# Patient Record
Sex: Male | Born: 1935
Health system: Southern US, Community
[De-identification: ages and names within clinical notes are randomized; demographics above are authoritative.]

## PROBLEM LIST (undated history)

## (undated) DIAGNOSIS — M8580 Other specified disorders of bone density and structure, unspecified site: Secondary | ICD-10-CM

## (undated) DIAGNOSIS — K635 Polyp of colon: Secondary | ICD-10-CM

## (undated) DIAGNOSIS — E785 Hyperlipidemia, unspecified: Secondary | ICD-10-CM

## (undated) DIAGNOSIS — I4891 Unspecified atrial fibrillation: Secondary | ICD-10-CM

## (undated) DIAGNOSIS — K219 Gastro-esophageal reflux disease without esophagitis: Secondary | ICD-10-CM

## (undated) DIAGNOSIS — I10 Essential (primary) hypertension: Secondary | ICD-10-CM

## (undated) HISTORY — DX: Hyperlipidemia, unspecified: E78.5

## (undated) HISTORY — PX: COLONOSCOPY: SHX174

## (undated) HISTORY — DX: Essential (primary) hypertension: I10

## (undated) HISTORY — DX: Gastro-esophageal reflux disease without esophagitis: K21.9

## (undated) HISTORY — DX: Unspecified atrial fibrillation: I48.91

## (undated) HISTORY — DX: Other specified disorders of bone density and structure, unspecified site: M85.80

## (undated) HISTORY — DX: Polyp of colon: K63.5

## (undated) HISTORY — PX: CATARACT EXTRACTION: SUR2

## (undated) HISTORY — PX: OTHER SURGICAL HISTORY: SHX169

---

## 1999-11-09 ENCOUNTER — Ambulatory Visit (HOSPITAL_COMMUNITY): Admission: RE | Admit: 1999-11-09 | Discharge: 1999-11-09 | Payer: Self-pay | Admitting: Gastroenterology

## 1999-11-09 ENCOUNTER — Encounter (INDEPENDENT_AMBULATORY_CARE_PROVIDER_SITE_OTHER): Payer: Self-pay | Admitting: Specialist

## 2000-01-18 ENCOUNTER — Ambulatory Visit (HOSPITAL_COMMUNITY): Admission: RE | Admit: 2000-01-18 | Discharge: 2000-01-18 | Payer: Self-pay | Admitting: Gastroenterology

## 2000-09-19 ENCOUNTER — Encounter (INDEPENDENT_AMBULATORY_CARE_PROVIDER_SITE_OTHER): Payer: Self-pay | Admitting: Specialist

## 2000-09-19 ENCOUNTER — Ambulatory Visit (HOSPITAL_COMMUNITY): Admission: RE | Admit: 2000-09-19 | Discharge: 2000-09-19 | Payer: Self-pay | Admitting: Gastroenterology

## 2003-11-23 ENCOUNTER — Ambulatory Visit (HOSPITAL_COMMUNITY): Admission: RE | Admit: 2003-11-23 | Discharge: 2003-11-23 | Payer: Self-pay | Admitting: Gastroenterology

## 2004-03-14 ENCOUNTER — Ambulatory Visit (HOSPITAL_COMMUNITY): Admission: RE | Admit: 2004-03-14 | Discharge: 2004-03-14 | Payer: Self-pay | Admitting: Family Medicine

## 2010-03-24 ENCOUNTER — Encounter: Payer: Self-pay | Admitting: Cardiology

## 2010-04-01 ENCOUNTER — Encounter: Payer: Self-pay | Admitting: Cardiology

## 2010-04-06 ENCOUNTER — Encounter: Payer: Self-pay | Admitting: Cardiology

## 2010-04-14 ENCOUNTER — Ambulatory Visit: Payer: Self-pay | Admitting: Cardiology

## 2010-04-14 DIAGNOSIS — I10 Essential (primary) hypertension: Secondary | ICD-10-CM | POA: Insufficient documentation

## 2010-04-14 DIAGNOSIS — E663 Overweight: Secondary | ICD-10-CM | POA: Insufficient documentation

## 2010-07-05 NOTE — Assessment & Plan Note (Signed)
Summary: np6/ekg changes/jml   Visit Type:  Initial Consult Primary Provider:  Paulene Floor, NP  CC:  Abnormal EKG.  History of Present Illness: The patient presents for followup of an abnormal EKG. He has no prior cardiac history. However, recently he had some subtle inferior ST changes. He also had premature atrial contractions. He has been an active gentleman though not as much recently as his wife has been ill. He does climb stairs in his house many times a day. With this he denies any chest pressure, neck or arm discomfort. He denies any shortness of breath, PND or orthopnea. He has never had palpitations, presyncope or syncope. He has never had weight gain or edema.  Current Medications (verified): 1)  Quinapril Hcl 40 Mg Tabs (Quinapril Hcl) .... 1/2 By Mouth Daily 2)  Hydrochlorothiazide 25 Mg Tabs (Hydrochlorothiazide) .Marland Kitchen.. 1 By Mouth Dialy 3)  Ranitidine Hcl 150 Mg Caps (Ranitidine Hcl) .Marland Kitchen.. 1 By Mouth Daily 4)  Simvastatin 20 Mg Tabs (Simvastatin) .Marland Kitchen.. 1 By Mouth Daily 5)  Alendronate Sodium 35 Mg Tabs (Alendronate Sodium) .Marland Kitchen.. 1 By Mouth Weekly 6)  Aspirin 81 Mg  Tabs (Aspirin) .Marland Kitchen.. 1 By Mouth Daily 7)  Omega-3 350 Mg Caps (Omega-3 Fatty Acids) .Marland Kitchen.. 1 By Mouth Daily 8)  Calcium Carbonate   Powd (Calcium Carbonate) .Marland Kitchen.. 1 By Mouth Daily 9)  Glucosamine 500 Mg Caps (Glucosamine Sulfate) .Marland Kitchen.. 1 By Mouth Daily  Allergies (verified): No Known Drug Allergies  Past History:  Past Medical History: Hypertension x 20 years Hyperlipidemia x 3 years  Past Surgical History: None  Family History: No early CAD  Social History: Retired, married No children Never tobacco or EtOH  Review of Systems       As stated in the HPI and negative for all other systems.   Vital Signs:  Patient profile:   77 year old male Height:      70 inches Weight:      208 pounds BMI:     29.95 Pulse rate:   76 / minute Resp:     16 per minute BP sitting:   132 / 82  (right arm)  Vitals  Entered By: Marrion Coy, CNA (April 14, 2010 3:11 PM)  Physical Exam  General:  Well developed, well nourished, in no acute distress. Head:  normocephalic and atraumatic Eyes:  PERRLA/EOM intact; conjunctiva and lids normal. Mouth:  Dentures. Oral mucosa normal. Neck:  Neck supple, no JVD. No masses, thyromegaly or abnormal cervical nodes. Chest Wall:  no deformities or breast masses noted Lungs:  Clear bilaterally to auscultation and percussion. Abdomen:  Bowel sounds positive; abdomen soft and non-tender without masses, organomegaly, or hernias noted. No hepatosplenomegaly. Msk:  Back normal, normal gait. Muscle strength and tone normal. Extremities:  No clubbing or cyanosis. Neurologic:  Alert and oriented x 3. Skin:  Intact without lesions or rashes. Cervical Nodes:  no significant adenopathy Axillary Nodes:  no significant adenopathy Inguinal Nodes:  no significant adenopathy Psych:  Normal affect.   Detailed Cardiovascular Exam  Neck    Carotids: Carotids full and equal bilaterally without bruits.      Neck Veins: Normal, no JVD.    Heart    Inspection: no deformities or lifts noted.      Palpation: normal PMI with no thrills palpable.      Auscultation: regular rate and rhythm, S1, S2 without murmurs, rubs, gallops, or clicks.    Vascular    Abdominal Aorta: no palpable masses, pulsations, or  audible bruits.      Femoral Pulses: normal femoral pulses bilaterally.      Pedal Pulses: normal pedal pulses bilaterally.      Radial Pulses: normal radial pulses bilaterally.      Peripheral Circulation: no clubbing, cyanosis, or edema noted with normal capillary refill.     Impression & Recommendations:  Problem # 1:  ABNORMAL ELECTROCARDIOGRAM (ICD-794.31) The patient has a minimally abnormal EKG. He has no symptoms though he does have risk factors.  He should have an exercise treadmill test.  This could be done at [facility].  Problem # 2:  HYPERTENSION, BENIGN  (ICD-401.1) His blood pressure is controlled and he will continue on the meds as listed.  Problem # 3:  OVERWEIGHT (ICD-278.02) We didi discsuss the need for weight loss with diet and exercise.  Patient Instructions: 1)  Your physician recommends that you schedule a follow-up appointment as needed 2)  Your physician recommends that you continue on your current medications as directed. Please refer to the Current Medication list given to you today. 3)  Your physician has requested that you have an exercise tolerance test.  THis should be done at [facility]

## 2010-07-05 NOTE — Letter (Signed)
Summary: [facility]  [facility]   Imported By: Marylou Mccoy 04/27/2010 16:08:20  _____________________________________________________________________  External Attachment:    Type:   Image     Comment:   External Document

## 2010-10-21 NOTE — Procedures (Signed)
[facility]  Patient:    Frank Cook, Frank Cook                        MRN: [id number] Proc. Date: 01/18/00 Adm. Date:  [id number] Attending:  Louie Bun CC:         Dr. Celene Skeen, [place], [place].   Procedure Report  PROCEDURE:  Colonoscopy with polypectomy.  INDICATION FOR PROCEDURE:  Large incompletely removed ascending colon polyps 2 months ago. The procedure is to reassess the area after partial fulguration in an attempt to complete endoscopic destruction is possible.  DESCRIPTION OF PROCEDURE:  The patient was placed in the left lateral decubitus position and placed on the pulse monitor with continuous low flow oxygen delivered by nasal cannula. He was sedated with 80 mg IV Demerol and 9 mg IV Versed. The Olympus video colonoscope was inserted into the rectum and advanced to the cecum, confirmed by transillumination of McBurneys point and visualization of the ileocecal valve and appendiceal orifice. The prep was excellent. The base of the cecum appeared normal. The vicinity where I thought the previous polyp had been was a haustral fold that appeared nearly normal with a granular appearance opposite the ileocecal valve. Thinking that this was the area of the previous polypectomy but that the remaining polyp appeared obliterated, I took 2 colon biopsies of this area. On withdrawal further, however, I saw the true site of the previous polyp and there was about a 1.5 cm irregular soft sessile polyp remaining. This was removed by the snare in 3 applications with some uncauterized polyp remaining and this area was touched with the hot biopsy forceps. It was uncertain whether the polyp was completely destroyed but all visible surface areas displayed cautery marks. The remainder of the ascending, transverse, descending and sigmoid colon appeared normal with no further masses, polyps, diverticula or other mucosal abnormalities. The rectum likewise appeared  normal and retroflexed view of the anus revealed no obvious internal hemorrhoids. The colonoscope was then withdrawn and the patient returned to the recovery room in stable condition. The patient tolerated the procedure well and there were no immediate complications.  IMPRESSION:  Persistent 1.5 cm sessile polyp with attempt to complete removal by snare and hot biopsy.  PLAN:  Await histology and will probable recommend repeat colonoscopy in 6 months unless pathology shows invasive malignancy. DD:  01/18/00 TD:  01/18/00 Job: [id number]

## 2010-10-21 NOTE — Op Note (Signed)
[facility]  Patient:    Frank Cook, Frank Cook                        MRN: [id number] Proc. Date: 11/09/99 Adm. Date:  [id number] Disc. Date: [id number] Attending:  Louie Bun CC:         Dr. Celene Skeen                           Operative Report  PROCEDURE PERFORMED:  Colonoscopy.  ENDOSCOPIST:  Everardo All. Madilyn Fireman, M.D.  INDICATIONS FOR PROCEDURE:  Heme positive stool.  PROCEDURE PERFORMED:  The patient was placed in the left lateral decubitus position and placed on the pulse monitor and continuous low flow oxygen delivered by nasal cannula.  He was sedated with 50 mg IV Demerol and 7 mg IV Versed.  The Olympus video colonoscope was inserted into the rectum and advanced to the cecum, confirmed by transillumination of McBurneys point and visualization of the ileocecal valve and appendiceal orifice.  The prep was excellent.  Within the base of the cecum there were two small sessile polyps, both of which were removed by snare.  At the junction of the cecum and ascending colon, there was a large polypoid mass traversed along a haustral ridge approximately 2.5 cm x 1.5 cm in diameter.  It was elected to inject the base of this with saline which raised the mucosa and appeared to provide a safer margin for attempted snare.  We tried to ensnare the majority of the polyp within the limits of visibility of ensuring that no tissue outside the polyp was snared and a cautery was applied and we attempted to shave off a piece of the polyp.  The snare went through the tissue but the tissue did not come loose from the surrounding polyp for unclear reasons.  We then snared the piece again and applied cautery and this time did break off about half the polyp.  At this point I elected not to pursue any more cautery to the base of this lesion with about half the polyp apparently remaining.  There was a small satellite 8 mm polyp that was fulgurated in place with a hot biopsy  forceps and appeared to be completely destroyed.  The remainder of the ascending, transverse, descending and sigmoid colon all appeared normal with no further polyps, masses, diverticula or other mucosal abnormalities.  A small rectal polyp was removed by hot biopsy and sent in specimen container #1 with two small cecal polyps.  The colonoscope was then withdrawn and the patient returned to the recovery room in stable condition.  He tolerated the procedure well.  There were no immediate complications.  IMPRESSION: 1. Small cecal and rectal polyps. 2. Large ascending colon polyp only partially removed by snare.  PLAN:  Await histology and unless invasive malignancy in the large polyp, will need to repeat colonoscopy with attempted complete destruction of the larger polyp in approximately two months. DD:  11/09/99 TD:  11/14/99 Job: [id number]

## 2010-10-21 NOTE — Op Note (Signed)
NAME:  Frank Cook, Frank Cook                           ACCOUNT NO.:  [id number]   MEDICAL RECORD NO.:  [id number]                   PATIENT TYPE:  AMB   LOCATION:  ENDO                                 FACILITY:  [facility]   PHYSICIAN:  John C. Madilyn Fireman, M.D.                 DATE OF BIRTH:  1932-04-12   DATE OF PROCEDURE:  11/23/2003  DATE OF DISCHARGE:                                 OPERATIVE REPORT   PROCEDURE:  Colonoscopy.   INDICATIONS FOR PROCEDURE:  History of adenomatous colon polyps on  colonoscopy 3 years ago.   DESCRIPTION OF PROCEDURE:  The patient was placed in the left lateral  decubitus position and placed on the pulse monitor with continuous low-flow  oxygen delivered by nasal cannula.  He was sedated with 87.5 mcg IV fentanyl  and 9 mg IV Versed.  The Olympus video colonoscope was inserted into the  rectum and advanced to the cecum, confirmed by transillumination at  McBurney's point and visualization of the ileocecal valve and appendiceal  orifice.  The prep was excellent.  The cecum, ascending, transverse,  descending, and sigmoid colon all appeared normal with no masses, polyps,  diverticula, or other mucosal abnormalities.  The rectum likewise appeared  normal, and retroflexed view of the anus revealed no obvious internal  hemorrhoids.  The scope was then withdrawn, and the patient returned to the  recovery room in stable condition.  He tolerated the procedure well, and  there were no immediate complications.   IMPRESSION:  Normal colonoscopy.   PLAN:  Repeat colonoscopy in 5 years.                                               John C. Madilyn Fireman, M.D.    JCH/MEDQ  D:  11/23/2003  T:  11/23/2003  Job:  [id number]   cc:   Fleet Contras, M.D.  [address]  [place]  [place] [address]  Fax: [phone]

## 2010-10-21 NOTE — Procedures (Signed)
[facility]  Patient:    Frank Cook, Frank Cook                        MRN: [id number] Proc. Date: 09/19/00 Adm. Date:  [id number] Attending:  Louie Bun CC:         Dr. Teola Bradley   Procedure Report  PROCEDURE:  Colonoscopy with polypectomy.  INDICATION FOR PROCEDURE:  History of large rectal polyp incompletely removed at two previous sessions with focus of high grade dysplasia but no malignancy at the time of last colonoscopy. The procedure is to assess for residual polyp and definitively fulgurate if possible.  DESCRIPTION OF PROCEDURE:  The patient was placed in the left lateral decubitus position then placed on the pulse monitor with continuous low flow oxygen delivered by nasal cannula. He was sedated with 50 mg IV Demerol and  5 mg IV Versed. The Olympus video colonoscope was inserted into the rectum and advanced to the cecum, confirmed by transillumination at McBurneys point and visualization of the ileocecal valve and appendiceal orifice. The prep was excellent. The cecum appeared normal. Within the proximal ascending colon was seen a sessile multilobulated 1.5 cm polyp with ill defined margins. It was removed by snare in two pieces and appeared to be completely fulgurated. There was a smaller 8 mm polyp nearby which was fulgurated by hot biopsy. The remainder of the ascending, transverse, descending and sigmoid colon appeared normal with no further masses, polyps, diverticula or other mucosal abnormalities. Within the rectum was seen a small 8 mm sessile polyp consistent with a hyperplastic polyp and this was fulgurated and sent in the same specimen container with the ascending polyps. The colonoscope was then withdrawn and the patient returned to the recovery room in stable condition. The patient tolerated the procedure well and there were no immediate complications.  IMPRESSION: 1. Residual ascending colon polyp felt to be completely  fulgurated. 2. Smaller 8 mm ascending colon polyp and 8 mm rectal polyp both fulgurated    by hot biopsy.  PLAN:  Await histology and if no malignancy will probably repeat colonoscopy in three years. DD:  09/19/00 TD:  09/20/00 Job: [id number]

## 2011-07-24 DIAGNOSIS — E782 Mixed hyperlipidemia: Secondary | ICD-10-CM | POA: Diagnosis not present

## 2011-07-24 DIAGNOSIS — E785 Hyperlipidemia, unspecified: Secondary | ICD-10-CM | POA: Diagnosis not present

## 2011-07-24 DIAGNOSIS — E559 Vitamin D deficiency, unspecified: Secondary | ICD-10-CM | POA: Diagnosis not present

## 2011-07-24 DIAGNOSIS — I10 Essential (primary) hypertension: Secondary | ICD-10-CM | POA: Diagnosis not present

## 2011-07-24 DIAGNOSIS — Z125 Encounter for screening for malignant neoplasm of prostate: Secondary | ICD-10-CM | POA: Diagnosis not present

## 2011-10-23 DIAGNOSIS — I10 Essential (primary) hypertension: Secondary | ICD-10-CM | POA: Diagnosis not present

## 2011-10-23 DIAGNOSIS — E559 Vitamin D deficiency, unspecified: Secondary | ICD-10-CM | POA: Diagnosis not present

## 2011-10-23 DIAGNOSIS — E785 Hyperlipidemia, unspecified: Secondary | ICD-10-CM | POA: Diagnosis not present

## 2011-10-26 DIAGNOSIS — H524 Presbyopia: Secondary | ICD-10-CM | POA: Diagnosis not present

## 2011-10-26 DIAGNOSIS — H5200 Hypermetropia, unspecified eye: Secondary | ICD-10-CM | POA: Diagnosis not present

## 2011-10-26 DIAGNOSIS — H2510 Age-related nuclear cataract, unspecified eye: Secondary | ICD-10-CM | POA: Diagnosis not present

## 2011-10-26 DIAGNOSIS — H52229 Regular astigmatism, unspecified eye: Secondary | ICD-10-CM | POA: Diagnosis not present

## 2012-01-22 DIAGNOSIS — I10 Essential (primary) hypertension: Secondary | ICD-10-CM | POA: Diagnosis not present

## 2012-01-22 DIAGNOSIS — E785 Hyperlipidemia, unspecified: Secondary | ICD-10-CM | POA: Diagnosis not present

## 2012-03-28 DIAGNOSIS — Z23 Encounter for immunization: Secondary | ICD-10-CM | POA: Diagnosis not present

## 2012-04-22 DIAGNOSIS — E785 Hyperlipidemia, unspecified: Secondary | ICD-10-CM | POA: Diagnosis not present

## 2012-04-22 DIAGNOSIS — I10 Essential (primary) hypertension: Secondary | ICD-10-CM | POA: Diagnosis not present

## 2012-07-24 DIAGNOSIS — E785 Hyperlipidemia, unspecified: Secondary | ICD-10-CM | POA: Diagnosis not present

## 2012-07-24 DIAGNOSIS — Z125 Encounter for screening for malignant neoplasm of prostate: Secondary | ICD-10-CM | POA: Diagnosis not present

## 2012-10-01 ENCOUNTER — Encounter: Payer: Self-pay | Admitting: *Deleted

## 2012-10-24 ENCOUNTER — Ambulatory Visit (INDEPENDENT_AMBULATORY_CARE_PROVIDER_SITE_OTHER): Payer: Medicare Other | Admitting: Nurse Practitioner

## 2012-10-24 ENCOUNTER — Encounter: Payer: Self-pay | Admitting: Nurse Practitioner

## 2012-10-24 VITALS — BP 126/72 | Temp 98.5°F | Ht 70.9 in | Wt 204.0 lb

## 2012-10-24 DIAGNOSIS — E559 Vitamin D deficiency, unspecified: Secondary | ICD-10-CM

## 2012-10-24 DIAGNOSIS — E785 Hyperlipidemia, unspecified: Secondary | ICD-10-CM | POA: Insufficient documentation

## 2012-10-24 DIAGNOSIS — I10 Essential (primary) hypertension: Secondary | ICD-10-CM

## 2012-10-24 LAB — COMPLETE METABOLIC PANEL WITH GFR
ALT: 22 U/L (ref 0–53)
AST: 27 U/L (ref 0–37)
Albumin: 4.4 g/dL (ref 3.5–5.2)
Alkaline Phosphatase: 34 U/L — ABNORMAL LOW (ref 39–117)
BUN: 26 mg/dL — ABNORMAL HIGH (ref 6–23)
CO2: 27 mEq/L (ref 19–32)
Calcium: 9.9 mg/dL (ref 8.4–10.5)
Chloride: 104 mEq/L (ref 96–112)
Creat: 1.89 mg/dL — ABNORMAL HIGH (ref 0.50–1.35)
GFR, Est African American: 38 mL/min — ABNORMAL LOW
GFR, Est Non African American: 33 mL/min — ABNORMAL LOW
Glucose, Bld: 106 mg/dL — ABNORMAL HIGH (ref 70–99)
Potassium: 4.4 mEq/L (ref 3.5–5.3)
Sodium: 140 mEq/L (ref 135–145)
Total Bilirubin: 0.5 mg/dL (ref 0.3–1.2)
Total Protein: 7.3 g/dL (ref 6.0–8.3)

## 2012-10-24 NOTE — Progress Notes (Signed)
  Subjective:    Patient ID: Frank Cook, male    DOB: March 19, 1932, 79 y.o.   MRN: [id number]  Hyperlipidemia This is a chronic problem. The current episode started more than 1 year ago. The problem is controlled. Recent lipid tests were reviewed and are normal. There are no known factors aggravating his hyperlipidemia. Pertinent negatives include no chest pain, focal weakness, leg pain, myalgias or shortness of breath. Current antihyperlipidemic treatment includes statins. The current treatment provides significant improvement of lipids. There are no compliance problems.  Risk factors for coronary artery disease include hypertension and male sex.  Hypertension This is a chronic problem. The current episode started more than 1 year ago. The problem is unchanged. The problem is controlled. Pertinent negatives include no blurred vision, chest pain, headaches, malaise/fatigue, neck pain, palpitations, peripheral edema or shortness of breath. There are no associated agents to hypertension. Risk factors for coronary artery disease include dyslipidemia and male gender. Past treatments include ACE inhibitors and diuretics. The current treatment provides significant improvement. There are no compliance problems.   Gastrophageal Reflux He reports no chest pain, no heartburn, no hoarse voice, no nausea or no sore throat. This is a chronic problem. The current episode started more than 1 year ago. The problem occurs rarely. The problem has been resolved. Nothing aggravates the symptoms. Pertinent negatives include no fatigue or melena. There are no known risk factors. He has tried an antacid for the symptoms. The treatment provided significant relief.      Review of Systems  Constitutional: Negative for malaise/fatigue and fatigue.  HENT: Negative for sore throat, hoarse voice and neck pain.   Eyes: Negative for blurred vision.  Respiratory: Negative for shortness of breath.   Cardiovascular: Negative for  chest pain and palpitations.  Gastrointestinal: Negative for heartburn, nausea and melena.  Musculoskeletal: Negative for myalgias.  Neurological: Negative for focal weakness and headaches.  All other systems reviewed and are negative.       Objective:   Physical Exam  Constitutional: He is oriented to person, place, and time. He appears well-developed and well-nourished.  HENT:  Head: Normocephalic.  Right Ear: External ear normal.  Left Ear: External ear normal.  Nose: Nose normal.  Mouth/Throat: Oropharynx is clear and moist.  Eyes: EOM are normal. Pupils are equal, round, and reactive to light.  Neck: Normal range of motion. Neck supple. No thyromegaly present.  Cardiovascular: Normal rate, regular rhythm, normal heart sounds and intact distal pulses.   No murmur heard. Pulmonary/Chest: Effort normal and breath sounds normal. He has no wheezes. He has no rales.  Abdominal: Soft. Bowel sounds are normal.  Musculoskeletal: Normal range of motion.  Neurological: He is alert and oriented to person, place, and time.  Skin: Skin is warm and dry.  Psychiatric: He has a normal mood and affect. His behavior is normal. Judgment and thought content normal.    BP 126/72  Temp(Src) 98.5 F (36.9 C) (Oral)  Ht 5' 10.9" (1.801 m)  Wt 204 lb (92.534 kg)  BMI 28.53 kg/m2       Assessment & Plan:  1. Hyperlipidemia Low fat diet and exercise - NMR Lipoprofile with Lipids  2. Unspecified vitamin D deficiency Continue Vitamin D supplements  3. Hypertension Low Na+ diet - COMPLETE METABOLIC PANEL WITH GFR  Mary-Margaret Daphine Deutscher, FNP

## 2012-10-24 NOTE — Patient Instructions (Signed)

## 2012-10-25 ENCOUNTER — Ambulatory Visit: Payer: Self-pay | Admitting: Nurse Practitioner

## 2012-10-29 LAB — NMR LIPOPROFILE WITH LIPIDS
Cholesterol, Total: 135 mg/dL (ref <200)
HDL Particle Number: 33.8 umol/L (ref >=30.5)
HDL Size: 8.6 nm — ABNORMAL LOW (ref >=9.2)
HDL-C: 49 mg/dL (ref >=40)
LDL (calc): 50 mg/dL (ref <100)
LDL Particle Number: 965 nmol/L (ref <1000)
LDL Size: 20.5 nm — ABNORMAL LOW (ref >20.5)
LP-IR Score: 60 — ABNORMAL HIGH (ref <=45)
Large HDL-P: 2.9 umol/L — ABNORMAL LOW (ref >=4.8)
Large VLDL-P: 2.3 nmol/L (ref <=2.7)
Small LDL Particle Number: 430 nmol/L (ref <=527)
Triglycerides: 182 mg/dL — ABNORMAL HIGH (ref <150)
VLDL Size: 42.7 nm (ref <=46.6)

## 2012-11-29 ENCOUNTER — Other Ambulatory Visit (INDEPENDENT_AMBULATORY_CARE_PROVIDER_SITE_OTHER): Payer: Medicare Other

## 2012-11-29 DIAGNOSIS — R7989 Other specified abnormal findings of blood chemistry: Secondary | ICD-10-CM | POA: Diagnosis not present

## 2012-11-29 LAB — BASIC METABOLIC PANEL WITH GFR
BUN: 23 mg/dL (ref 6–23)
CO2: 26 mEq/L (ref 19–32)
Calcium: 9.5 mg/dL (ref 8.4–10.5)
Chloride: 104 mEq/L (ref 96–112)
Creat: 1.64 mg/dL — ABNORMAL HIGH (ref 0.50–1.35)
GFR, Est African American: 46 mL/min — ABNORMAL LOW
GFR, Est Non African American: 39 mL/min — ABNORMAL LOW
Glucose, Bld: 107 mg/dL — ABNORMAL HIGH (ref 70–99)
Potassium: 4.2 mEq/L (ref 3.5–5.3)
Sodium: 141 mEq/L (ref 135–145)

## 2012-11-29 NOTE — Progress Notes (Signed)
Pt came in for labs only 

## 2012-12-11 DIAGNOSIS — H5200 Hypermetropia, unspecified eye: Secondary | ICD-10-CM | POA: Diagnosis not present

## 2012-12-11 DIAGNOSIS — H2510 Age-related nuclear cataract, unspecified eye: Secondary | ICD-10-CM | POA: Diagnosis not present

## 2012-12-11 DIAGNOSIS — H524 Presbyopia: Secondary | ICD-10-CM | POA: Diagnosis not present

## 2012-12-11 DIAGNOSIS — H52229 Regular astigmatism, unspecified eye: Secondary | ICD-10-CM | POA: Diagnosis not present

## 2012-12-18 ENCOUNTER — Other Ambulatory Visit: Payer: Self-pay | Admitting: Nurse Practitioner

## 2013-02-04 ENCOUNTER — Other Ambulatory Visit: Payer: Self-pay | Admitting: Nurse Practitioner

## 2013-02-24 ENCOUNTER — Telehealth: Payer: Self-pay | Admitting: Nurse Practitioner

## 2013-02-24 ENCOUNTER — Other Ambulatory Visit: Payer: Self-pay | Admitting: Nurse Practitioner

## 2013-02-24 ENCOUNTER — Ambulatory Visit (INDEPENDENT_AMBULATORY_CARE_PROVIDER_SITE_OTHER): Payer: Medicare Other | Admitting: Nurse Practitioner

## 2013-02-24 ENCOUNTER — Encounter: Payer: Self-pay | Admitting: Nurse Practitioner

## 2013-02-24 VITALS — BP 126/71 | HR 69 | Temp 98.8°F | Ht 70.0 in | Wt 200.0 lb

## 2013-02-24 DIAGNOSIS — Z23 Encounter for immunization: Secondary | ICD-10-CM

## 2013-02-24 DIAGNOSIS — I10 Essential (primary) hypertension: Secondary | ICD-10-CM

## 2013-02-24 DIAGNOSIS — Z125 Encounter for screening for malignant neoplasm of prostate: Secondary | ICD-10-CM

## 2013-02-24 DIAGNOSIS — E559 Vitamin D deficiency, unspecified: Secondary | ICD-10-CM

## 2013-02-24 DIAGNOSIS — E785 Hyperlipidemia, unspecified: Secondary | ICD-10-CM | POA: Diagnosis not present

## 2013-02-24 DIAGNOSIS — K219 Gastro-esophageal reflux disease without esophagitis: Secondary | ICD-10-CM

## 2013-02-24 MED ORDER — HYDROCHLOROTHIAZIDE 25 MG PO TABS: 25.0000 mg | ORAL_TABLET | Freq: Every day | ORAL | Status: DC

## 2013-02-24 MED ORDER — RANITIDINE HCL 150 MG PO TABS: 150.0000 mg | ORAL_TABLET | Freq: Two times a day (BID) | ORAL | Status: DC

## 2013-02-24 MED ORDER — QUINAPRIL HCL 40 MG PO TABS: ORAL_TABLET | ORAL | Status: DC

## 2013-02-24 MED ORDER — QUINAPRIL HCL 40 MG PO TABS: 40.0000 mg | ORAL_TABLET | Freq: Every day | ORAL | Status: DC

## 2013-02-24 MED ORDER — SIMVASTATIN 20 MG PO TABS: 20.0000 mg | ORAL_TABLET | Freq: Every day | ORAL | Status: DC

## 2013-02-24 NOTE — Telephone Encounter (Signed)
Suppose to be 1 1/2Po qd

## 2013-02-24 NOTE — Progress Notes (Signed)
Subjective:    Patient ID: Frank Cook, male    DOB: 26-Aug-1931, 79 y.o.   MRN: [id number]  Hyperlipidemia This is a chronic problem. The current episode started more than 1 year ago. The problem is controlled. Recent lipid tests were reviewed and are normal. There are no known factors aggravating his hyperlipidemia. Pertinent negatives include no chest pain, focal weakness, leg pain, myalgias or shortness of breath. Current antihyperlipidemic treatment includes statins. The current treatment provides significant improvement of lipids. There are no compliance problems.  Risk factors for coronary artery disease include hypertension and male sex.  Hypertension This is a chronic problem. The current episode started more than 1 year ago. The problem is unchanged. The problem is controlled. Pertinent negatives include no blurred vision, chest pain, headaches, malaise/fatigue, neck pain, palpitations, peripheral edema or shortness of breath. There are no associated agents to hypertension. Risk factors for coronary artery disease include dyslipidemia and male gender. Past treatments include ACE inhibitors and diuretics. The current treatment provides significant improvement. There are no compliance problems.   Gastrophageal Reflux He reports no chest pain, no heartburn, no hoarse voice, no nausea or no sore throat. This is a chronic problem. The current episode started more than 1 year ago. The problem occurs rarely. The problem has been resolved. Nothing aggravates the symptoms. Pertinent negatives include no fatigue or melena. There are no known risk factors. He has tried an antacid for the symptoms. The treatment provided significant relief.      Review of Systems  Constitutional: Negative for malaise/fatigue and fatigue.  HENT: Negative for sore throat, hoarse voice and neck pain.   Eyes: Negative for blurred vision.  Respiratory: Negative for shortness of breath.   Cardiovascular: Negative for  chest pain and palpitations.  Gastrointestinal: Negative for heartburn, nausea and melena.  Musculoskeletal: Negative for myalgias.  Neurological: Negative for focal weakness and headaches.  All other systems reviewed and are negative.       Objective:   Physical Exam  Constitutional: He is oriented to person, place, and time. He appears well-developed and well-nourished.  HENT:  Head: Normocephalic.  Right Ear: External ear normal.  Left Ear: External ear normal.  Nose: Nose normal.  Mouth/Throat: Oropharynx is clear and moist.  Eyes: EOM are normal. Pupils are equal, round, and reactive to light.  Neck: Normal range of motion. Neck supple. No thyromegaly present.  Cardiovascular: Normal rate, regular rhythm, normal heart sounds and intact distal pulses.   No murmur heard. Pulmonary/Chest: Effort normal and breath sounds normal. He has no wheezes. He has no rales.  Abdominal: Soft. Bowel sounds are normal.  Musculoskeletal: Normal range of motion.  Neurological: He is alert and oriented to person, place, and time.  Skin: Skin is warm and dry.  Psychiatric: He has a normal mood and affect. His behavior is normal. Judgment and thought content normal.    BP 126/71  Pulse 69  Temp(Src) 98.8 F (37.1 C) (Oral)  Ht 5\' 10"  (1.778 m)  Wt 200 lb (90.719 kg)  BMI 28.7 kg/m2       Assessment & Plan:  1. Hyperlipidemia Low fat diet and exercise - NMR Lipoprofile with Lipids  2. Unspecified vitamin D deficiency Continue Vitamin D supplements  3. Hypertension Low Na+ diet - COMPLETE METABOLIC PANEL WITH GFR  Meds ordered this encounter  Medications  . simvastatin (ZOCOR) 20 MG tablet    Sig: Take 1 tablet (20 mg total) by mouth daily.    Dispense:  90 tablet    Refill:  1  . ranitidine (ZANTAC) 150 MG tablet    Sig: Take 1 tablet (150 mg total) by mouth 2 (two) times daily.    Dispense:  180 tablet    Refill:  1  . quinapril (ACCUPRIL) 40 MG tablet    Sig: Take 1  tablet (40 mg total) by mouth daily.    Dispense:  135 tablet    Refill:  1  . hydrochlorothiazide (HYDRODIURIL) 25 MG tablet    Sig: Take 1 tablet (25 mg total) by mouth daily.    Dispense:  90 tablet    Refill:  1  . aspirin 81 MG tablet    Sig: Take 81 mg by mouth daily.  . cholecalciferol (VITAMIN D) 1000 UNITS tablet    Sig: Take 1,000 Units by mouth daily.     Mary-Margaret Daphine Deutscher, FNP

## 2013-02-24 NOTE — Telephone Encounter (Signed)
Corrected and resent to pharmacy

## 2013-02-24 NOTE — Patient Instructions (Signed)

## 2013-02-26 LAB — CMP14+EGFR
ALT: 16 IU/L (ref 0–44)
AST: 23 IU/L (ref 0–40)
Albumin/Globulin Ratio: 1.8 (ref 1.1–2.5)
Albumin: 4.5 g/dL (ref 3.5–4.8)
Alkaline Phosphatase: 41 IU/L (ref 39–117)
BUN/Creatinine Ratio: 12 (ref 10–22)
BUN: 18 mg/dL (ref 8–27)
CO2: 27 mmol/L (ref 18–29)
Calcium: 10 mg/dL (ref 8.6–10.2)
Chloride: 101 mmol/L (ref 97–108)
Creatinine, Ser: 1.45 mg/dL — ABNORMAL HIGH (ref 0.76–1.27)
GFR calc Af Amer: 53 mL/min/{1.73_m2} — ABNORMAL LOW (ref >59)
GFR calc non Af Amer: 45 mL/min/{1.73_m2} — ABNORMAL LOW (ref >59)
Globulin, Total: 2.5 g/dL (ref 1.5–4.5)
Glucose: 106 mg/dL — ABNORMAL HIGH (ref 65–99)
Potassium: 4.7 mmol/L (ref 3.5–5.2)
Sodium: 141 mmol/L (ref 134–144)
Total Bilirubin: 0.4 mg/dL (ref 0.0–1.2)
Total Protein: 7 g/dL (ref 6.0–8.5)

## 2013-02-26 LAB — NMR, LIPOPROFILE
Cholesterol: 130 mg/dL (ref <200)
HDL Cholesterol by NMR: 54 mg/dL (ref >=40)
HDL Particle Number: 36.4 umol/L (ref >=30.5)
LDL Particle Number: 926 nmol/L (ref <1000)
LDL Size: 20.5 nm — ABNORMAL LOW (ref >20.5)
LDLC SERPL CALC-MCNC: 47 mg/dL (ref <100)
LP-IR Score: 50 — ABNORMAL HIGH (ref <=45)
Small LDL Particle Number: 455 nmol/L (ref <=527)
Triglycerides by NMR: 145 mg/dL (ref <150)

## 2013-02-26 LAB — PSA, TOTAL AND FREE
PSA, Free Pct: 36 %
PSA, Free: 0.18 ng/mL
PSA: 0.5 ng/mL (ref 0.0–4.0)

## 2013-05-24 ENCOUNTER — Other Ambulatory Visit: Payer: Self-pay | Admitting: Nurse Practitioner

## 2013-06-09 ENCOUNTER — Ambulatory Visit (INDEPENDENT_AMBULATORY_CARE_PROVIDER_SITE_OTHER): Payer: Medicare Other | Admitting: Nurse Practitioner

## 2013-06-09 ENCOUNTER — Encounter: Payer: Self-pay | Admitting: Nurse Practitioner

## 2013-06-09 VITALS — BP 137/79 | HR 72 | Temp 98.1°F | Ht 70.0 in | Wt 204.0 lb

## 2013-06-09 DIAGNOSIS — I10 Essential (primary) hypertension: Secondary | ICD-10-CM | POA: Diagnosis not present

## 2013-06-09 DIAGNOSIS — E785 Hyperlipidemia, unspecified: Secondary | ICD-10-CM | POA: Diagnosis not present

## 2013-06-09 DIAGNOSIS — E559 Vitamin D deficiency, unspecified: Secondary | ICD-10-CM | POA: Diagnosis not present

## 2013-06-09 NOTE — Patient Instructions (Signed)
Health Maintenance, Males A healthy lifestyle and preventative care can promote health and wellness.  Maintain regular health, dental, and eye exams.  Eat a healthy diet. Foods like vegetables, fruits, whole grains, low-fat dairy products, and lean protein foods contain the nutrients you need without too many calories. Decrease your intake of foods high in solid fats, added sugars, and salt. Get information about a proper diet from your caregiver, if necessary.  Regular physical exercise is one of the most important things you can do for your health. Most adults should get at least 150 minutes of moderate-intensity exercise (any activity that increases your heart rate and causes you to sweat) each week. In addition, most adults need muscle-strengthening exercises on 2 or more days a week.   Maintain a healthy weight. The body mass index (BMI) is a screening tool to identify possible weight problems. It provides an estimate of body fat based on height and weight. Your caregiver can help determine your BMI, and can help you achieve or maintain a healthy weight. For adults 20 years and older:  A BMI below 18.5 is considered underweight.  A BMI of 18.5 to 24.9 is normal.  A BMI of 25 to 29.9 is considered overweight.  A BMI of 30 and above is considered obese.  Maintain normal blood lipids and cholesterol by exercising and minimizing your intake of saturated fat. Eat a balanced diet with plenty of fruits and vegetables. Blood tests for lipids and cholesterol should begin at age 20 and be repeated every 5 years. If your lipid or cholesterol levels are high, you are over 50, or you are a high risk for heart disease, you may need your cholesterol levels checked more frequently.Ongoing high lipid and cholesterol levels should be treated with medicines, if diet and exercise are not effective.  If you smoke, find out from your caregiver how to quit. If you do not use tobacco, do not start.  Lung  cancer screening is recommended for adults aged 55 80 years who are at high risk for developing lung cancer because of a history of smoking. Yearly low-dose computed tomography (CT) is recommended for people who have at least a 30-pack-year history of smoking and are a current smoker or have quit within the past 15 years. A pack year of smoking is smoking an average of 1 pack of cigarettes a day for 1 year (for example: 1 pack a day for 30 years or 2 packs a day for 15 years). Yearly screening should continue until the smoker has stopped smoking for at least 15 years. Yearly screening should also be stopped for people who develop a health problem that would prevent them from having lung cancer treatment.  If you choose to drink alcohol, do not exceed 2 drinks per day. One drink is considered to be 12 ounces (355 mL) of beer, 5 ounces (148 mL) of wine, or 1.5 ounces (44 mL) of liquor.  Avoid use of street drugs. Do not share needles with anyone. Ask for help if you need support or instructions about stopping the use of drugs.  High blood pressure causes heart disease and increases the risk of stroke. Blood pressure should be checked at least every 1 to 2 years. Ongoing high blood pressure should be treated with medicines if weight loss and exercise are not effective.  If you are 45 to 79 years old, ask your caregiver if you should take aspirin to prevent heart disease.  Diabetes screening involves taking a blood   sample to check your fasting blood sugar level. This should be done once every 3 years, after age 45, if you are within normal weight and without risk factors for diabetes. Testing should be considered at a younger age or be carried out more frequently if you are overweight and have at least 1 risk factor for diabetes.  Colorectal cancer can be detected and often prevented. Most routine colorectal cancer screening begins at the age of 50 and continues through age 75. However, your caregiver may  recommend screening at an earlier age if you have risk factors for colon cancer. On a yearly basis, your caregiver may provide home test kits to check for hidden blood in the stool. Use of a small camera at the end of a tube, to directly examine the colon (sigmoidoscopy or colonoscopy), can detect the earliest forms of colorectal cancer. Talk to your caregiver about this at age 50, when routine screening begins. Direct examination of the colon should be repeated every 5 to 10 years through age 75, unless early forms of pre-cancerous polyps or small growths are found.  Hepatitis C blood testing is recommended for all people born from 1945 through 1965 and any individual with known risks for hepatitis C.  Healthy men should no longer receive prostate-specific antigen (PSA) blood tests as part of routine cancer screening. Consult with your caregiver about prostate cancer screening.  Testicular cancer screening is not recommended for adolescents or adult males who have no symptoms. Screening includes self-exam, caregiver exam, and other screening tests. Consult with your caregiver about any symptoms you have or any concerns you have about testicular cancer.  Practice safe sex. Use condoms and avoid high-risk sexual practices to reduce the spread of sexually transmitted infections (STIs).  Use sunscreen. Apply sunscreen liberally and repeatedly throughout the day. You should seek shade when your shadow is shorter than you. Protect yourself by wearing long sleeves, pants, a wide-brimmed hat, and sunglasses year round, whenever you are outdoors.  Notify your caregiver of new moles or changes in moles, especially if there is a change in shape or color. Also notify your caregiver if a mole is larger than the size of a pencil eraser.  A one-time screening for abdominal aortic aneurysm (AAA) and surgical repair of large AAAs by sound wave imaging (ultrasonography) is recommended for ages 65 to 75 years who are  current or former smokers.  Stay current with your immunizations. Document Released: 11/18/2007 Document Revised: 09/16/2012 Document Reviewed: 10/17/2010 [employer] Patient Information 2014 [employer].  

## 2013-06-09 NOTE — Progress Notes (Signed)
Subjective:    Patient ID: Frank Cook, male    DOB: 11/26/1931, 80 y.o.   MRN: [id number]  Patient in today for follow up of multiple medical problems- no changes since last visit.  Hyperlipidemia This is a chronic problem. The current episode started more than 1 year ago. The problem is controlled. Recent lipid tests were reviewed and are normal. There are no known factors aggravating his hyperlipidemia. Pertinent negatives include no chest pain, focal weakness, leg pain, myalgias or shortness of breath. Current antihyperlipidemic treatment includes statins. The current treatment provides significant improvement of lipids. There are no compliance problems.  Risk factors for coronary artery disease include hypertension and male sex.  Hypertension This is a chronic problem. The current episode started more than 1 year ago. The problem is unchanged. The problem is controlled. Pertinent negatives include no blurred vision, chest pain, headaches, malaise/fatigue, neck pain, palpitations, peripheral edema or shortness of breath. There are no associated agents to hypertension. Risk factors for coronary artery disease include dyslipidemia and male gender. Past treatments include ACE inhibitors and diuretics. The current treatment provides significant improvement. There are no compliance problems.   Gastrophageal Reflux He reports no chest pain, no heartburn, no hoarse voice, no nausea or no sore throat. This is a chronic problem. The current episode started more than 1 year ago. The problem occurs rarely. The problem has been resolved. Nothing aggravates the symptoms. Pertinent negatives include no fatigue or melena. There are no known risk factors. He has tried an antacid for the symptoms. The treatment provided significant relief.      Review of Systems  Constitutional: Negative for malaise/fatigue and fatigue.  HENT: Negative for hoarse voice and sore throat.   Eyes: Negative for blurred vision.   Respiratory: Negative for shortness of breath.   Cardiovascular: Negative for chest pain and palpitations.  Gastrointestinal: Negative for heartburn, nausea and melena.  Musculoskeletal: Negative for myalgias and neck pain.  Neurological: Negative for focal weakness and headaches.  All other systems reviewed and are negative.       Objective:   Physical Exam  Constitutional: He is oriented to person, place, and time. He appears well-developed and well-nourished.  HENT:  Head: Normocephalic.  Right Ear: External ear normal.  Left Ear: External ear normal.  Nose: Nose normal.  Mouth/Throat: Oropharynx is clear and moist.  Eyes: EOM are normal. Pupils are equal, round, and reactive to light.  Neck: Normal range of motion. Neck supple. No thyromegaly present.  Cardiovascular: Normal rate, normal heart sounds and intact distal pulses.   No murmur heard. Regular irregularity    Pulmonary/Chest: Effort normal and breath sounds normal. He has no wheezes. He has no rales.  Abdominal: Soft. Bowel sounds are normal.  Musculoskeletal: Normal range of motion.  Neurological: He is alert and oriented to person, place, and time.  Skin: Skin is warm and dry.  Psychiatric: He has a normal mood and affect. His behavior is normal. Judgment and thought content normal.    BP 137/79  Pulse 72  Temp(Src) 98.1 F (36.7 C) (Oral)  Ht 5' 10"  (1.778 m)  Wt 204 lb (92.534 kg)  BMI 29.27 kg/m2       Assessment & Plan:   1. HYPERTENSION, BENIGN   2. Hyperlipidemia   3. Unspecified vitamin D deficiency    Orders Placed This Encounter  Procedures  . CMP14+EGFR  . NMR, lipoprofile    Given hemoccult cards Continue all meds Labs pending Diet and exercise  encouraged Health maintenance reviewed Follow up in 3 months   [place], FNP

## 2013-06-10 LAB — CMP14+EGFR
ALT: 20 IU/L (ref 0–44)
AST: 21 IU/L (ref 0–40)
Albumin/Globulin Ratio: 1.8 (ref 1.1–2.5)
Albumin: 4.7 g/dL (ref 3.5–4.8)
Alkaline Phosphatase: 41 IU/L (ref 39–117)
BUN/Creatinine Ratio: 12 (ref 10–22)
BUN: 19 mg/dL (ref 8–27)
CO2: 24 mmol/L (ref 18–29)
Calcium: 9.9 mg/dL (ref 8.6–10.2)
Chloride: 102 mmol/L (ref 97–108)
Creatinine, Ser: 1.62 mg/dL — ABNORMAL HIGH (ref 0.76–1.27)
GFR calc Af Amer: 46 mL/min/{1.73_m2} — ABNORMAL LOW (ref >59)
GFR calc non Af Amer: 40 mL/min/{1.73_m2} — ABNORMAL LOW (ref >59)
Globulin, Total: 2.6 g/dL (ref 1.5–4.5)
Glucose: 110 mg/dL — ABNORMAL HIGH (ref 65–99)
Potassium: 5 mmol/L (ref 3.5–5.2)
Sodium: 143 mmol/L (ref 134–144)
Total Bilirubin: 0.4 mg/dL (ref 0.0–1.2)
Total Protein: 7.3 g/dL (ref 6.0–8.5)

## 2013-06-10 LAB — NMR, LIPOPROFILE
Cholesterol: 147 mg/dL (ref <200)
HDL Cholesterol by NMR: 58 mg/dL (ref >=40)
HDL Particle Number: 37.9 umol/L (ref >=30.5)
LDL Particle Number: 937 nmol/L (ref <1000)
LDL Size: 20.6 nm (ref >20.5)
LDLC SERPL CALC-MCNC: 55 mg/dL (ref <100)
LP-IR Score: 62 — ABNORMAL HIGH (ref <=45)
Small LDL Particle Number: 399 nmol/L (ref <=527)
Triglycerides by NMR: 172 mg/dL — ABNORMAL HIGH (ref <150)

## 2013-06-12 ENCOUNTER — Other Ambulatory Visit: Payer: Medicare Other

## 2013-06-12 DIAGNOSIS — Z1212 Encounter for screening for malignant neoplasm of rectum: Secondary | ICD-10-CM | POA: Diagnosis not present

## 2013-06-15 LAB — FECAL OCCULT BLOOD, IMMUNOCHEMICAL: Fecal Occult Bld: NEGATIVE

## 2013-09-10 ENCOUNTER — Encounter: Payer: Self-pay | Admitting: Nurse Practitioner

## 2013-09-10 ENCOUNTER — Ambulatory Visit (INDEPENDENT_AMBULATORY_CARE_PROVIDER_SITE_OTHER): Payer: Medicare Other | Admitting: Nurse Practitioner

## 2013-09-10 VITALS — BP 132/67 | HR 72 | Temp 98.4°F | Ht 70.0 in | Wt 197.0 lb

## 2013-09-10 DIAGNOSIS — I10 Essential (primary) hypertension: Secondary | ICD-10-CM | POA: Diagnosis not present

## 2013-09-10 DIAGNOSIS — K219 Gastro-esophageal reflux disease without esophagitis: Secondary | ICD-10-CM

## 2013-09-10 DIAGNOSIS — E785 Hyperlipidemia, unspecified: Secondary | ICD-10-CM | POA: Diagnosis not present

## 2013-09-10 DIAGNOSIS — E559 Vitamin D deficiency, unspecified: Secondary | ICD-10-CM | POA: Diagnosis not present

## 2013-09-10 MED ORDER — HYDROCHLOROTHIAZIDE 25 MG PO TABS: 25.0000 mg | ORAL_TABLET | Freq: Every day | ORAL | Status: DC

## 2013-09-10 MED ORDER — SIMVASTATIN 20 MG PO TABS: ORAL_TABLET | ORAL | Status: DC

## 2013-09-10 MED ORDER — QUINAPRIL HCL 40 MG PO TABS: ORAL_TABLET | ORAL | Status: DC

## 2013-09-10 MED ORDER — RANITIDINE HCL 150 MG PO TABS: ORAL_TABLET | ORAL | Status: DC

## 2013-09-10 NOTE — Patient Instructions (Signed)

## 2013-09-10 NOTE — Progress Notes (Signed)
Subjective:    Patient ID: Frank Cook, male    DOB: 1931-08-25, 80 y.o.   MRN: [id number]  Patient in today for follow up of multiple medical problems- no changes since last visit.  Hyperlipidemia This is a chronic problem. The current episode started more than 1 year ago. The problem is controlled. Recent lipid tests were reviewed and are normal. There are no known factors aggravating his hyperlipidemia. Pertinent negatives include no chest pain, focal weakness, leg pain, myalgias or shortness of breath. Current antihyperlipidemic treatment includes statins. The current treatment provides significant improvement of lipids. There are no compliance problems.  Risk factors for coronary artery disease include hypertension and male sex.  Hypertension This is a chronic problem. The current episode started more than 1 year ago. The problem is unchanged. The problem is controlled. Pertinent negatives include no blurred vision, chest pain, headaches, malaise/fatigue, neck pain, palpitations, peripheral edema or shortness of breath. There are no associated agents to hypertension. Risk factors for coronary artery disease include dyslipidemia and male gender. Past treatments include ACE inhibitors and diuretics. The current treatment provides significant improvement. There are no compliance problems.   Gastrophageal Reflux He reports no chest pain, no heartburn, no hoarse voice, no nausea or no sore throat. This is a chronic problem. The current episode started more than 1 year ago. The problem occurs rarely. The problem has been resolved. Nothing aggravates the symptoms. Pertinent negatives include no fatigue or melena. There are no known risk factors. He has tried an antacid for the symptoms. The treatment provided significant relief.      Review of Systems  Constitutional: Negative for malaise/fatigue and fatigue.  HENT: Negative for hoarse voice and sore throat.   Eyes: Negative for blurred vision.   Respiratory: Negative for shortness of breath.   Cardiovascular: Negative for chest pain and palpitations.  Gastrointestinal: Negative for heartburn, nausea and melena.  Musculoskeletal: Negative for myalgias and neck pain.  Neurological: Negative for focal weakness and headaches.  All other systems reviewed and are negative.      Objective:   Physical Exam  Constitutional: He is oriented to person, place, and time. He appears well-developed and well-nourished.  HENT:  Head: Normocephalic.  Right Ear: External ear normal.  Left Ear: External ear normal.  Nose: Nose normal.  Mouth/Throat: Oropharynx is clear and moist.  Eyes: EOM are normal. Pupils are equal, round, and reactive to light.  Neck: Normal range of motion. Neck supple. No thyromegaly present.  Cardiovascular: Normal rate, normal heart sounds and intact distal pulses.   No murmur heard. Regular irregularity    Pulmonary/Chest: Effort normal and breath sounds normal. He has no wheezes. He has no rales.  Abdominal: Soft. Bowel sounds are normal.  Musculoskeletal: Normal range of motion.  Neurological: He is alert and oriented to person, place, and time.  Skin: Skin is warm and dry.  Psychiatric: He has a normal mood and affect. His behavior is normal. Judgment and thought content normal.    BP 132/67  Pulse 72  Temp(Src) 98.4 F (36.9 C) (Oral)  Ht 5' 10"  (1.778 m)  Wt 197 lb (89.359 kg)  BMI 28.27 kg/m2       Assessment & Plan:   1. Unspecified vitamin D deficiency   2. HYPERTENSION, BENIGN   3. Hyperlipidemia   4. GERD (gastroesophageal reflux disease)    Orders Placed This Encounter  Procedures  . CMP14+EGFR  . NMR, lipoprofile   Meds ordered this encounter  Medications  .  hydrochlorothiazide (HYDRODIURIL) 25 MG tablet    Sig: Take 1 tablet (25 mg total) by mouth daily.    Dispense:  90 tablet    Refill:  1    Order Specific Question:  Supervising Provider    Answer:  Chipper Herb [[id number]]   . quinapril (ACCUPRIL) 40 MG tablet    Sig: 1 1/2 po qd    Dispense:  135 tablet    Refill:  1    Order Specific Question:  Supervising Provider    Answer:  Chipper Herb [[id number]]  . ranitidine (ZANTAC) 150 MG tablet    Sig: TAKE  (1)  TABLET TWICE A DAY.    Dispense:  180 tablet    Refill:  1    Order Specific Question:  Supervising Provider    Answer:  Chipper Herb [[id number]]  . simvastatin (ZOCOR) 20 MG tablet    Sig: TAKE 1 TABLET DAILY    Dispense:  90 tablet    Refill:  1    Order Specific Question:  Supervising Provider    Answer:  Chipper Herb [[id number]]    Labs pending Health maintenance reviewed Diet and exercise encouraged Continue all meds Follow up  In 3 month   [place], FNP

## 2013-09-12 LAB — CMP14+EGFR
ALT: 21 IU/L (ref 0–44)
AST: 24 IU/L (ref 0–40)
Albumin/Globulin Ratio: 1.6 (ref 1.1–2.5)
Albumin: 4.1 g/dL (ref 3.5–4.8)
Alkaline Phosphatase: 34 IU/L — ABNORMAL LOW (ref 39–117)
BUN/Creatinine Ratio: 12 (ref 10–22)
BUN: 18 mg/dL (ref 8–27)
CO2: 25 mmol/L (ref 18–29)
Calcium: 9.7 mg/dL (ref 8.6–10.2)
Chloride: 101 mmol/L (ref 97–108)
Creatinine, Ser: 1.45 mg/dL — ABNORMAL HIGH (ref 0.76–1.27)
GFR calc Af Amer: 53 mL/min/{1.73_m2} — ABNORMAL LOW (ref >59)
GFR calc non Af Amer: 45 mL/min/{1.73_m2} — ABNORMAL LOW (ref >59)
Globulin, Total: 2.5 g/dL (ref 1.5–4.5)
Glucose: 112 mg/dL — ABNORMAL HIGH (ref 65–99)
Potassium: 4.1 mmol/L (ref 3.5–5.2)
Sodium: 140 mmol/L (ref 134–144)
Total Bilirubin: 0.3 mg/dL (ref 0.0–1.2)
Total Protein: 6.6 g/dL (ref 6.0–8.5)

## 2013-09-12 LAB — NMR, LIPOPROFILE
Cholesterol: 142 mg/dL (ref <200)
HDL Cholesterol by NMR: 58 mg/dL (ref >=40)
HDL Particle Number: 34.3 umol/L (ref >=30.5)
LDL Particle Number: 924 nmol/L (ref <1000)
LDL Size: 20.7 nm (ref >20.5)
LDLC SERPL CALC-MCNC: 61 mg/dL (ref <100)
LP-IR Score: 45 (ref <=45)
Small LDL Particle Number: 454 nmol/L (ref <=527)
Triglycerides by NMR: 113 mg/dL (ref <150)

## 2013-10-06 ENCOUNTER — Encounter: Payer: Self-pay | Admitting: *Deleted

## 2013-12-10 ENCOUNTER — Ambulatory Visit: Payer: Medicare Other | Admitting: Nurse Practitioner

## 2013-12-11 ENCOUNTER — Ambulatory Visit (INDEPENDENT_AMBULATORY_CARE_PROVIDER_SITE_OTHER): Payer: Medicare Other | Admitting: Nurse Practitioner

## 2013-12-11 ENCOUNTER — Encounter: Payer: Self-pay | Admitting: Nurse Practitioner

## 2013-12-11 VITALS — BP 132/68 | HR 69 | Temp 98.0°F | Ht 70.0 in | Wt 196.6 lb

## 2013-12-11 DIAGNOSIS — I10 Essential (primary) hypertension: Secondary | ICD-10-CM

## 2013-12-11 DIAGNOSIS — K219 Gastro-esophageal reflux disease without esophagitis: Secondary | ICD-10-CM | POA: Insufficient documentation

## 2013-12-11 DIAGNOSIS — E559 Vitamin D deficiency, unspecified: Secondary | ICD-10-CM

## 2013-12-11 DIAGNOSIS — E785 Hyperlipidemia, unspecified: Secondary | ICD-10-CM

## 2013-12-11 NOTE — Progress Notes (Signed)
Subjective:    Patient ID: Frank Cook, male    DOB: [birth year], 80 y.o.   MRN: [id number]  Patient in today for follow up of multiple medical problems- no changes since last visit.  Hypertension This is a chronic problem. The current episode started more than 1 year ago. The problem is unchanged. The problem is controlled. Pertinent negatives include no blurred vision, chest pain, headaches, malaise/fatigue, neck pain, palpitations, peripheral edema or shortness of breath. There are no associated agents to hypertension. Risk factors for coronary artery disease include dyslipidemia and male gender. Past treatments include ACE inhibitors and diuretics. The current treatment provides significant improvement. There are no compliance problems.   Hyperlipidemia This is a chronic problem. The current episode started more than 1 year ago. The problem is controlled. Recent lipid tests were reviewed and are normal. There are no known factors aggravating his hyperlipidemia. Pertinent negatives include no chest pain, focal weakness, leg pain, myalgias or shortness of breath. Current antihyperlipidemic treatment includes statins. The current treatment provides significant improvement of lipids. There are no compliance problems.  Risk factors for coronary artery disease include hypertension and male sex.  Gastrophageal Reflux He reports no chest pain, no heartburn, no hoarse voice, no nausea or no sore throat. This is a chronic problem. The current episode started more than 1 year ago. The problem occurs rarely. The problem has been resolved. Nothing aggravates the symptoms. Pertinent negatives include no fatigue or melena. There are no known risk factors. He has tried an antacid for the symptoms. The treatment provided significant relief.      Review of Systems  Constitutional: Negative for malaise/fatigue and fatigue.  HENT: Negative for hoarse voice and sore throat.   Eyes: Negative for blurred vision.   Respiratory: Negative for shortness of breath.   Cardiovascular: Negative for chest pain and palpitations.  Gastrointestinal: Negative for heartburn, nausea and melena.  Musculoskeletal: Negative for myalgias and neck pain.  Neurological: Negative for focal weakness and headaches.  All other systems reviewed and are negative.      Objective:   Physical Exam  Constitutional: He is oriented to person, place, and time. He appears well-developed and well-nourished.  HENT:  Head: Normocephalic.  Right Ear: External ear normal.  Left Ear: External ear normal.  Nose: Nose normal.  Mouth/Throat: Oropharynx is clear and moist.  Eyes: EOM are normal. Pupils are equal, round, and reactive to light.  Neck: Normal range of motion. Neck supple. No thyromegaly present.  Cardiovascular: Normal rate, normal heart sounds and intact distal pulses.   No murmur heard. Regular irregularity    Pulmonary/Chest: Effort normal and breath sounds normal. He has no wheezes. He has no rales.  Abdominal: Soft. Bowel sounds are normal.  Musculoskeletal: Normal range of motion.  Neurological: He is alert and oriented to person, place, and time.  Skin: Skin is warm and dry.  Psychiatric: He has a normal mood and affect. His behavior is normal. Judgment and thought content normal.    BP 132/68  Pulse 69  Temp(Src) 98 F (36.7 C) (Oral)  Ht $R'5\' 10"'cD$  (1.778 m)  Wt 196 lb 9.6 oz (89.177 kg)  BMI 28.21 kg/m2       Assessment & Plan:   1. Unspecified vitamin D deficiency   2. HYPERTENSION, BENIGN   3. Hyperlipidemia   4. Gastroesophageal reflux disease without esophagitis    Orders Placed This Encounter  Procedures  . CMP14+EGFR  . NMR, lipoprofile    Labs pending  Health maintenance reviewed Diet and exercise encouraged Continue all meds Follow up  In 3 months   [place], FNP

## 2013-12-11 NOTE — Patient Instructions (Signed)

## 2013-12-12 LAB — CMP14+EGFR
ALT: 29 IU/L (ref 0–44)
AST: 34 IU/L (ref 0–40)
Albumin/Globulin Ratio: 1.8 (ref 1.1–2.5)
Albumin: 4.6 g/dL (ref 3.5–4.7)
Alkaline Phosphatase: 39 IU/L (ref 39–117)
BUN/Creatinine Ratio: 16 (ref 10–22)
BUN: 24 mg/dL (ref 8–27)
CO2: 26 mmol/L (ref 18–29)
Calcium: 10.3 mg/dL — ABNORMAL HIGH (ref 8.6–10.2)
Chloride: 98 mmol/L (ref 97–108)
Creatinine, Ser: 1.51 mg/dL — ABNORMAL HIGH (ref 0.76–1.27)
GFR calc Af Amer: 50 mL/min/{1.73_m2} — ABNORMAL LOW (ref >59)
GFR calc non Af Amer: 43 mL/min/{1.73_m2} — ABNORMAL LOW (ref >59)
Globulin, Total: 2.5 g/dL (ref 1.5–4.5)
Glucose: 108 mg/dL — ABNORMAL HIGH (ref 65–99)
Potassium: 4.6 mmol/L (ref 3.5–5.2)
Sodium: 139 mmol/L (ref 134–144)
Total Bilirubin: 0.4 mg/dL (ref 0.0–1.2)
Total Protein: 7.1 g/dL (ref 6.0–8.5)

## 2013-12-12 LAB — NMR, LIPOPROFILE
Cholesterol: 145 mg/dL (ref 100–199)
HDL Cholesterol by NMR: 62 mg/dL (ref >39)
HDL Particle Number: 38.4 umol/L (ref >=30.5)
LDL Particle Number: 872 nmol/L (ref <1000)
LDL Size: 20.5 nm (ref >20.5)
LDLC SERPL CALC-MCNC: 61 mg/dL (ref 0–99)
LP-IR Score: 56 — ABNORMAL HIGH (ref <=45)
Small LDL Particle Number: 382 nmol/L (ref <=527)
Triglycerides by NMR: 109 mg/dL (ref 0–149)

## 2014-03-05 DIAGNOSIS — H2513 Age-related nuclear cataract, bilateral: Secondary | ICD-10-CM | POA: Diagnosis not present

## 2014-03-16 ENCOUNTER — Ambulatory Visit (INDEPENDENT_AMBULATORY_CARE_PROVIDER_SITE_OTHER): Payer: Medicare Other

## 2014-03-16 ENCOUNTER — Ambulatory Visit (INDEPENDENT_AMBULATORY_CARE_PROVIDER_SITE_OTHER): Payer: Medicare Other | Admitting: Nurse Practitioner

## 2014-03-16 ENCOUNTER — Encounter: Payer: Self-pay | Admitting: Nurse Practitioner

## 2014-03-16 VITALS — BP 130/72 | HR 66 | Temp 97.6°F | Ht 70.0 in | Wt 196.0 lb

## 2014-03-16 DIAGNOSIS — Z23 Encounter for immunization: Secondary | ICD-10-CM

## 2014-03-16 DIAGNOSIS — I4891 Unspecified atrial fibrillation: Secondary | ICD-10-CM

## 2014-03-16 DIAGNOSIS — K219 Gastro-esophageal reflux disease without esophagitis: Secondary | ICD-10-CM

## 2014-03-16 DIAGNOSIS — I10 Essential (primary) hypertension: Secondary | ICD-10-CM

## 2014-03-16 DIAGNOSIS — E785 Hyperlipidemia, unspecified: Secondary | ICD-10-CM

## 2014-03-16 DIAGNOSIS — E559 Vitamin D deficiency, unspecified: Secondary | ICD-10-CM

## 2014-03-16 MED ORDER — QUINAPRIL HCL 40 MG PO TABS: ORAL_TABLET | ORAL | Status: DC

## 2014-03-16 MED ORDER — SIMVASTATIN 20 MG PO TABS: ORAL_TABLET | ORAL | Status: DC

## 2014-03-16 MED ORDER — HYDROCHLOROTHIAZIDE 25 MG PO TABS: 25.0000 mg | ORAL_TABLET | Freq: Every day | ORAL | Status: DC

## 2014-03-16 MED ORDER — RANITIDINE HCL 150 MG PO TABS: ORAL_TABLET | ORAL | Status: DC

## 2014-03-16 NOTE — Progress Notes (Signed)
Subjective:    Patient ID: Frank Cook, male    DOB: [birth year], 80 y.o.   MRN: [id number]  Patient in today for follow up of multiple medical problems- no changes since last visit.  Hypertension This is a chronic problem. The current episode started more than 1 year ago. The problem is unchanged. The problem is controlled. Pertinent negatives include no blurred vision, chest pain, headaches, malaise/fatigue, neck pain, palpitations, peripheral edema or shortness of breath. There are no associated agents to hypertension. Risk factors for coronary artery disease include dyslipidemia and male gender. Past treatments include ACE inhibitors and diuretics. The current treatment provides significant improvement. There are no compliance problems.   Hyperlipidemia This is a chronic problem. The current episode started more than 1 year ago. The problem is controlled. Recent lipid tests were reviewed and are normal. There are no known factors aggravating his hyperlipidemia. Pertinent negatives include no chest pain, focal weakness, leg pain, myalgias or shortness of breath. Current antihyperlipidemic treatment includes statins. The current treatment provides significant improvement of lipids. There are no compliance problems.  Risk factors for coronary artery disease include hypertension and male sex.  Gastrophageal Reflux He reports no chest pain, no heartburn, no hoarse voice, no nausea or no sore throat. This is a chronic problem. The current episode started more than 1 year ago. The problem occurs rarely. The problem has been resolved. Nothing aggravates the symptoms. Pertinent negatives include no fatigue or melena. There are no known risk factors. He has tried an antacid for the symptoms. The treatment provided significant relief.      Review of Systems  Constitutional: Negative for malaise/fatigue and fatigue.  HENT: Negative for hoarse voice and sore throat.   Eyes: Negative for blurred vision.   Respiratory: Negative for shortness of breath.   Cardiovascular: Negative for chest pain and palpitations.  Gastrointestinal: Negative for heartburn, nausea and melena.  Musculoskeletal: Negative for myalgias and neck pain.  Neurological: Negative for focal weakness and headaches.  All other systems reviewed and are negative.      Objective:   Physical Exam  Constitutional: He is oriented to person, place, and time. He appears well-developed and well-nourished.  HENT:  Head: Normocephalic.  Right Ear: External ear normal.  Left Ear: External ear normal.  Nose: Nose normal.  Mouth/Throat: Oropharynx is clear and moist.  Eyes: EOM are normal. Pupils are equal, round, and reactive to light.  Neck: Normal range of motion. Neck supple. No thyromegaly present.  Cardiovascular: Normal rate, normal heart sounds and intact distal pulses.   No murmur heard. Regular irregularity    Pulmonary/Chest: Effort normal and breath sounds normal. He has no wheezes. He has no rales.  Abdominal: Soft. Bowel sounds are normal.  Musculoskeletal: Normal range of motion.  Neurological: He is alert and oriented to person, place, and time.  Skin: Skin is warm and dry.  Psychiatric: He has a normal mood and affect. His behavior is normal. Judgment and thought content normal.   C-CHF-0 H-Htn-1 A- age >70-1 D- diabetes-0 S- Hx stroke- 0 V- vascular Dx-0 Age >62- 1 S sex male-0 SCORE- 3  BP 130/72  Pulse 66  Temp(Src) 97.6 F (36.4 C) (Oral)  Ht 5' 10"  (1.778 m)  Wt 196 lb (88.905 kg)  BMI 28.12 kg/m2  EKG- atrial fib- ,mmms Chest x ray-     Assessment & Plan:    1. Encounter for immunization Flu shot today  2. HYPERTENSION, BENIGN Low NA+ diet - DG  Chest 2 View; Future - EKG 12-Lead - CMP14+EGFR - quinapril (ACCUPRIL) 40 MG tablet; 1 1/2 po qd  Dispense: 135 tablet; Refill: 1 - hydrochlorothiazide (HYDRODIURIL) 25 MG tablet; Take 1 tablet (25 mg total) by mouth daily.  Dispense:  90 tablet; Refill: 1  3. Hyperlipidemia Low fat diet - NMR, lipoprofile - simvastatin (ZOCOR) 20 MG tablet; TAKE 1 TABLET DAILY  Dispense: 90 tablet; Refill: 1  4. Gastroesophageal reflux disease without esophagitis Do not eat 2 hours prior to bedtime - ranitidine (ZANTAC) 150 MG tablet; TAKE  (1)  TABLET TWICE A DAY.  Dispense: 180 tablet; Refill: 1  5. Vitamin D deficiency Vit d OTC  6 Atrial fib -cardiology referral  Has appointment to see GI about colonoscopy Labs pending Health maintenance reviewed Diet and exercise encouraged Continue all meds Follow up  In 3 months   [place], FNP

## 2014-03-16 NOTE — Patient Instructions (Signed)

## 2014-03-17 LAB — CMP14+EGFR
ALT: 25 IU/L (ref 0–44)
AST: 34 IU/L (ref 0–40)
Albumin/Globulin Ratio: 1.7 (ref 1.1–2.5)
Albumin: 4.5 g/dL (ref 3.5–4.7)
Alkaline Phosphatase: 39 IU/L (ref 39–117)
BUN/Creatinine Ratio: 15 (ref 10–22)
BUN: 24 mg/dL (ref 8–27)
CO2: 23 mmol/L (ref 18–29)
Calcium: 9.9 mg/dL (ref 8.6–10.2)
Chloride: 101 mmol/L (ref 97–108)
Creatinine, Ser: 1.58 mg/dL — ABNORMAL HIGH (ref 0.76–1.27)
GFR calc Af Amer: 47 mL/min/{1.73_m2} — ABNORMAL LOW (ref >59)
GFR calc non Af Amer: 41 mL/min/{1.73_m2} — ABNORMAL LOW (ref >59)
Globulin, Total: 2.6 g/dL (ref 1.5–4.5)
Glucose: 106 mg/dL — ABNORMAL HIGH (ref 65–99)
Potassium: 4.4 mmol/L (ref 3.5–5.2)
Sodium: 138 mmol/L (ref 134–144)
Total Bilirubin: 0.5 mg/dL (ref 0.0–1.2)
Total Protein: 7.1 g/dL (ref 6.0–8.5)

## 2014-03-17 LAB — NMR, LIPOPROFILE
Cholesterol: 127 mg/dL (ref 100–199)
HDL Cholesterol by NMR: 57 mg/dL (ref >39)
HDL Particle Number: 36.6 umol/L (ref >=30.5)
LDL Particle Number: 726 nmol/L (ref <1000)
LDL Size: 20.4 nm (ref >20.5)
LDLC SERPL CALC-MCNC: 42 mg/dL (ref 0–99)
LP-IR Score: 36 (ref <=45)
Small LDL Particle Number: 309 nmol/L (ref <=527)
Triglycerides by NMR: 141 mg/dL (ref 0–149)

## 2014-04-10 ENCOUNTER — Encounter: Payer: Self-pay | Admitting: *Deleted

## 2014-04-14 ENCOUNTER — Encounter: Payer: Self-pay | Admitting: Cardiology

## 2014-04-14 ENCOUNTER — Ambulatory Visit (INDEPENDENT_AMBULATORY_CARE_PROVIDER_SITE_OTHER): Payer: Medicare Other | Admitting: Cardiology

## 2014-04-14 VITALS — BP 152/70 | HR 89 | Ht 70.0 in | Wt 198.5 lb

## 2014-04-14 DIAGNOSIS — R9431 Abnormal electrocardiogram [ECG] [EKG]: Secondary | ICD-10-CM | POA: Diagnosis not present

## 2014-04-14 NOTE — Patient Instructions (Signed)
Follow up as needed

## 2014-04-14 NOTE — Progress Notes (Signed)
HPI The patient presents for evaluation of an abnormal EKG. The computer reading of this in October suggested atrial fibrillation. This was done routinely. The patient actually does not feel palpitations. He does not have presyncope or syncope. He does not have chest pressure, neck or arm discomfort. He does not have weight gain or edema. He denies any shortness of breath, PND or orthopnea. He tries to stay active and was taking care of his wife who had a broken hip. He also has a large yard with slopes that he will walk. With this he denies any cardiovascular symptoms. I did see him years ago for some atypical chest pain. He had a negative stress test. It has been greater than 3 years since the last visit.  No Known Allergies  Current Outpatient Prescriptions  Medication Sig Dispense Refill  . aspirin 81 MG tablet Take 81 mg by mouth daily.    . cholecalciferol (VITAMIN D) 1000 UNITS tablet Take 1,000 Units by mouth daily.    . hydrochlorothiazide (HYDRODIURIL) 25 MG tablet Take 1 tablet (25 mg total) by mouth daily. 90 tablet 1  . quinapril (ACCUPRIL) 40 MG tablet 1 1/2 po qd 135 tablet 1  . ranitidine (ZANTAC) 150 MG tablet TAKE  (1)  TABLET TWICE A DAY. 180 tablet 1  . simvastatin (ZOCOR) 20 MG tablet TAKE 1 TABLET DAILY 90 tablet 1   No current facility-administered medications for this visit.    Past Medical History  Diagnosis Date  . Hypertension   . Hyperlipidemia   . GERD (gastroesophageal reflux disease)   . Osteopenia   . Colon polyps     No past surgical history on file.  Family History  Problem Relation Age of Onset  . COPD Mother   . Cancer Father     Bone  . Cancer Brother     Bone    History   Social History  . Marital Status: Married    Spouse Name: N/A    Number of Children: N/A  . Years of Education: N/A   Occupational History  . Not on file.   Social History Main Topics  . Smoking status: Never Smoker   . Smokeless tobacco: Not on file  .  Alcohol Use: No  . Drug Use: No  . Sexual Activity: Not on file   Other Topics Concern  . Not on file   Social History Narrative    ROS:  As stated in the HPI and negative for all other systems.  PHYSICAL EXAM BP 152/70 mmHg  Pulse 89  Ht 5\' 10"  (1.778 m)  Wt 198 lb 8 oz (90.039 kg)  BMI 28.48 kg/m2  GENERAL:  Well appearing HEENT:  Pupils equal round and reactive, fundi not visualized, oral mucosa unremarkable NECK:  No jugular venous distention, waveform within normal limits, carotid upstroke brisk and symmetric, no bruits, no thyromegaly LYMPHATICS:  No cervical, inguinal adenopathy LUNGS:  Clear to auscultation bilaterally BACK:  No CVA tenderness CHEST:  Unremarkable HEART:  PMI not displaced or sustained,S1 and S2 within normal limits, no S3, no S4, no clicks, no rubs, no murmurs ABD:  Flat, positive bowel sounds normal in frequency in pitch, no bruits, no rebound, no guarding, no midline pulsatile mass, no hepatomegaly, no splenomegaly EXT:  2 plus pulses throughout, no edema, no cyanosis no clubbing SKIN:  No rashes no nodules NEURO:  Cranial nerves II through XII grossly intact, motor grossly intact throughout PSYCH:  Cognitively intact, oriented to person  place and time  EKG:  Sinus rhythm, rate 89, axis within normal limits, intervals within normal limits, no acute ST-T wave changes.  Low voltage limb leads borderline. 04/14/2014   ASSESSMENT AND PLAN  ABNORMAL EKG:  EKG did not demonstrate atrial fibrillation in October as was reported by the computerized report. There were some PACs and baseline artifact. I see no evidence of atrial fibrillation. He has no symptoms. No further evaluation is planned.  HTN:  Blood pressure is slightly elevated today but this is unusual. This can continue to be monitored and he can continue his current meds.  CKD:  His creatinine last month was 1.58 which was stable. We discussed this. He can continue the meds as listed.

## 2014-06-17 ENCOUNTER — Ambulatory Visit (INDEPENDENT_AMBULATORY_CARE_PROVIDER_SITE_OTHER): Payer: Medicare Other | Admitting: Nurse Practitioner

## 2014-06-17 ENCOUNTER — Encounter: Payer: Self-pay | Admitting: Nurse Practitioner

## 2014-06-17 VITALS — BP 136/87 | HR 121 | Temp 96.8°F | Ht 70.0 in | Wt 198.0 lb

## 2014-06-17 DIAGNOSIS — E785 Hyperlipidemia, unspecified: Secondary | ICD-10-CM | POA: Diagnosis not present

## 2014-06-17 DIAGNOSIS — I10 Essential (primary) hypertension: Secondary | ICD-10-CM

## 2014-06-17 DIAGNOSIS — Z125 Encounter for screening for malignant neoplasm of prostate: Secondary | ICD-10-CM

## 2014-06-17 DIAGNOSIS — K219 Gastro-esophageal reflux disease without esophagitis: Secondary | ICD-10-CM | POA: Diagnosis not present

## 2014-06-17 NOTE — Patient Instructions (Signed)

## 2014-06-17 NOTE — Progress Notes (Signed)
Subjective:    Patient ID: Frank Cook, male    DOB: August 03, 1931, 81 y.o.   MRN: [id number]  HPI Patient here today for follow up of chronic medical problems. Saw cardiologist for abnomal EKG and said he was fine- has follow up in 6 months  Hypertension This is a chronic problem. The current episode started more than 1 year ago. The problem is unchanged. The problem is controlled. Pertinent negatives include no blurred vision, chest pain, headaches, malaise/fatigue, neck pain, palpitations, peripheral edema or shortness of breath. There are no associated agents to hypertension. Risk factors for coronary artery disease include dyslipidemia and male gender. Past treatments include ACE inhibitors and diuretics. The current treatment provides significant improvement. There are no compliance problems.   Hyperlipidemia This is a chronic problem. The current episode started more than 1 year ago. The problem is controlled. Recent lipid tests were reviewed and are normal. There are no known factors aggravating his hyperlipidemia. Pertinent negatives include no chest pain, focal weakness, leg pain, myalgias or shortness of breath. Current antihyperlipidemic treatment includes statins. The current treatment provides significant improvement of lipids. There are no compliance problems.  Risk factors for coronary artery disease include hypertension and male sex.  Gastrophageal Reflux He reports no chest pain, no heartburn, no hoarse voice, no nausea or no sore throat. This is a chronic problem. The current episode started more than 1 year ago. The problem occurs rarely. The problem has been resolved. Nothing aggravates the symptoms. Pertinent negatives include no fatigue or melena. There are no known risk factors. He has tried an antacid for the symptoms. The treatment provided significant relief.     Review of Systems  Constitutional: Negative.   HENT: Negative.   Respiratory: Negative.   Cardiovascular:  Negative.   Gastrointestinal: Negative.   Genitourinary: Negative.   Neurological: Negative.   Psychiatric/Behavioral: Negative.   All other systems reviewed and are negative.      Objective:   Physical Exam  Constitutional: He is oriented to person, place, and time. He appears well-developed and well-nourished.  HENT:  Head: Normocephalic.  Right Ear: External ear normal.  Left Ear: External ear normal.  Nose: Nose normal.  Mouth/Throat: Oropharynx is clear and moist.  Eyes: EOM are normal. Pupils are equal, round, and reactive to light.  Neck: Normal range of motion. Neck supple. No JVD present. No thyromegaly present.  Cardiovascular: Normal rate, regular rhythm, normal heart sounds and intact distal pulses.  Exam reveals no gallop and no friction rub.   No murmur heard. Pulmonary/Chest: Effort normal and breath sounds normal. No respiratory distress. He has no wheezes. He has no rales. He exhibits no tenderness.  Abdominal: Soft. Bowel sounds are normal. He exhibits no mass. There is no tenderness.  Genitourinary: Prostate normal and penis normal.  Musculoskeletal: Normal range of motion. He exhibits no edema.  Lymphadenopathy:    He has no cervical adenopathy.  Neurological: He is alert and oriented to person, place, and time. No cranial nerve deficit.  Skin: Skin is warm and dry.  Psychiatric: He has a normal mood and affect. His behavior is normal. Judgment and thought content normal.    BP 136/87 mmHg  Pulse 121  Temp(Src) 96.8 F (36 C) (Oral)  Ht 5' 10"  (1.778 m)  Wt 198 lb (89.812 kg)  BMI 28.41 kg/m2       Assessment & Plan:  1. HYPERTENSION, BENIGN Do  Not add salt to diet - CMP14+EGFR  2. Gastroesophageal  reflux disease without esophagitis Avoid spicy foods Do not eat 2 hours prior to bedtime  3. Hyperlipidemia Watch fat in diet - NMR, lipoprofile  4. Prostate cancer screening - PSA, total and free    Labs pending Health maintenance  reviewed Diet and exercise encouraged Continue all meds Follow up  In 3 month   [place], FNP

## 2014-06-18 LAB — NMR, LIPOPROFILE
Cholesterol: 132 mg/dL (ref 100–199)
HDL Cholesterol by NMR: 57 mg/dL (ref >39)
HDL PARTICLE NUMBER: 36.3 umol/L (ref >=30.5)
LDL Particle Number: 685 nmol/L (ref <1000)
LDL SIZE: 20.7 nm (ref >20.5)
LDL-C: 50 mg/dL (ref 0–99)
LP-IR Score: 53 — ABNORMAL HIGH (ref <=45)
Small LDL Particle Number: 307 nmol/L (ref <=527)
Triglycerides by NMR: 127 mg/dL (ref 0–149)

## 2014-06-18 LAB — CMP14+EGFR
ALBUMIN: 4.6 g/dL (ref 3.5–4.7)
ALT: 18 IU/L (ref 0–44)
AST: 22 IU/L (ref 0–40)
Albumin/Globulin Ratio: 1.8 (ref 1.1–2.5)
Alkaline Phosphatase: 39 IU/L (ref 39–117)
BUN / CREAT RATIO: 14 (ref 10–22)
BUN: 22 mg/dL (ref 8–27)
CALCIUM: 9.8 mg/dL (ref 8.6–10.2)
CHLORIDE: 99 mmol/L (ref 97–108)
CO2: 25 mmol/L (ref 18–29)
Creatinine, Ser: 1.55 mg/dL — ABNORMAL HIGH (ref 0.76–1.27)
GFR, EST AFRICAN AMERICAN: 48 mL/min/{1.73_m2} — AB (ref >59)
GFR, EST NON AFRICAN AMERICAN: 42 mL/min/{1.73_m2} — AB (ref >59)
GLOBULIN, TOTAL: 2.5 g/dL (ref 1.5–4.5)
GLUCOSE: 110 mg/dL — AB (ref 65–99)
POTASSIUM: 4.4 mmol/L (ref 3.5–5.2)
SODIUM: 141 mmol/L (ref 134–144)
TOTAL PROTEIN: 7.1 g/dL (ref 6.0–8.5)
Total Bilirubin: 0.5 mg/dL (ref 0.0–1.2)

## 2014-06-18 LAB — PSA, TOTAL AND FREE
PSA FREE PCT: 36 %
PSA, Free: 0.18 ng/mL
PSA: 0.5 ng/mL (ref 0.0–4.0)

## 2014-09-17 ENCOUNTER — Ambulatory Visit (INDEPENDENT_AMBULATORY_CARE_PROVIDER_SITE_OTHER): Payer: Medicare Other | Admitting: Nurse Practitioner

## 2014-09-17 ENCOUNTER — Encounter: Payer: Self-pay | Admitting: Nurse Practitioner

## 2014-09-17 VITALS — BP 130/82 | HR 107 | Temp 98.1°F | Ht 70.0 in | Wt 196.5 lb

## 2014-09-17 DIAGNOSIS — K219 Gastro-esophageal reflux disease without esophagitis: Secondary | ICD-10-CM | POA: Diagnosis not present

## 2014-09-17 DIAGNOSIS — E785 Hyperlipidemia, unspecified: Secondary | ICD-10-CM

## 2014-09-17 DIAGNOSIS — Z23 Encounter for immunization: Secondary | ICD-10-CM | POA: Diagnosis not present

## 2014-09-17 DIAGNOSIS — E559 Vitamin D deficiency, unspecified: Secondary | ICD-10-CM | POA: Diagnosis not present

## 2014-09-17 DIAGNOSIS — I10 Essential (primary) hypertension: Secondary | ICD-10-CM

## 2014-09-17 MED ORDER — QUINAPRIL HCL 40 MG PO TABS: ORAL_TABLET | ORAL | Status: DC

## 2014-09-17 MED ORDER — RANITIDINE HCL 150 MG PO TABS: ORAL_TABLET | ORAL | Status: DC

## 2014-09-17 MED ORDER — HYDROCHLOROTHIAZIDE 25 MG PO TABS: 25.0000 mg | ORAL_TABLET | Freq: Every day | ORAL | Status: DC

## 2014-09-17 MED ORDER — SIMVASTATIN 20 MG PO TABS: ORAL_TABLET | ORAL | Status: DC

## 2014-09-17 NOTE — Progress Notes (Signed)
Subjective:    Patient ID: Frank Cook, male    DOB: [birth year], 81 y.o.   MRN: [id number]  HPI  Patient here today for follow up of chronic medical problems. Saw cardiologist for abnomal EKG and said he was fine- has follow up in 6 months  Hypertension This is a chronic problem. The current episode started more than 1 year ago. The problem is unchanged. The problem is controlled. Pertinent negatives include no blurred vision, chest pain, headaches, malaise/fatigue, neck pain, palpitations, peripheral edema or shortness of breath. There are no associated agents to hypertension. Risk factors for coronary artery disease include dyslipidemia and male gender. Past treatments include ACE inhibitors and diuretics. The current treatment provides significant improvement. There are no compliance problems.   Hyperlipidemia This is a chronic problem. The current episode started more than 1 year ago. The problem is controlled. Recent lipid tests were reviewed and are normal. There are no known factors aggravating his hyperlipidemia. Pertinent negatives include no chest pain, focal weakness, leg pain, myalgias or shortness of breath. Current antihyperlipidemic treatment includes statins. The current treatment provides significant improvement of lipids. There are no compliance problems.  Risk factors for coronary artery disease include hypertension and male sex.  Gastrophageal Reflux He reports no chest pain, no heartburn, no hoarse voice, no nausea or no sore throat. This is a chronic problem. The current episode started more than 1 year ago. The problem occurs rarely. The problem has been resolved. Nothing aggravates the symptoms. Pertinent negatives include no fatigue or melena. There are no known risk factors. He has tried an antacid for the symptoms. The treatment provided significant relief.     Review of Systems  Constitutional: Negative.   HENT: Negative.   Respiratory: Negative.   Cardiovascular:  Negative.   Gastrointestinal: Negative.   Genitourinary: Negative.   Neurological: Negative.   Psychiatric/Behavioral: Negative.   All other systems reviewed and are negative.      Objective:   Physical Exam  Constitutional: He is oriented to person, place, and time. He appears well-developed and well-nourished.  HENT:  Head: Normocephalic.  Right Ear: External ear normal.  Left Ear: External ear normal.  Nose: Nose normal.  Mouth/Throat: Oropharynx is clear and moist.  Eyes: EOM are normal. Pupils are equal, round, and reactive to light.  Neck: Normal range of motion. Neck supple. No JVD present. No thyromegaly present.  Cardiovascular: Normal rate, regular rhythm, normal heart sounds and intact distal pulses.  Exam reveals no gallop and no friction rub.   No murmur heard. Pulmonary/Chest: Effort normal and breath sounds normal. No respiratory distress. He has no wheezes. He has no rales. He exhibits no tenderness.  Abdominal: Soft. Bowel sounds are normal. He exhibits no mass. There is no tenderness.  Genitourinary: Prostate normal and penis normal.  Musculoskeletal: Normal range of motion. He exhibits no edema.  Lymphadenopathy:    He has no cervical adenopathy.  Neurological: He is alert and oriented to person, place, and time. No cranial nerve deficit.  Skin: Skin is warm and dry.  Psychiatric: He has a normal mood and affect. His behavior is normal. Judgment and thought content normal.    BP 130/82 mmHg  Pulse 107  Temp(Src) 98.1 F (36.7 C) (Oral)  Ht 5' 10" (1.778 m)  Wt 196 lb 8 oz (89.132 kg)  BMI 28.19 kg/m2       Assessment & Plan:   1. HYPERTENSION, BENIGN Do not add slat to diet - quinapril (  ACCUPRIL) 40 MG tablet; 1 1/2 po qd  Dispense: 135 tablet; Refill: 1 - hydrochlorothiazide (HYDRODIURIL) 25 MG tablet; Take 1 tablet (25 mg total) by mouth daily.  Dispense: 90 tablet; Refill: 1 - CMP14+EGFR  2. Gastroesophageal reflux disease without  esophagitis Avoid spicy foods Do not eat 2 hours prior to bedtime - ranitidine (ZANTAC) 150 MG tablet; TAKE  (1)  TABLET TWICE A DAY.  Dispense: 180 tablet; Refill: 1  3. Vitamin D deficiency  4. Hyperlipidemia Low fat diet - simvastatin (ZOCOR) 20 MG tablet; TAKE 1 TABLET DAILY  Dispense: 90 tablet; Refill: 1 - NMR, lipoprofile   prevnar vaccine today Labs pending Health maintenance reviewed Diet and exercise encouraged Continue all meds Follow up  In 3 month   [place], FNP

## 2014-09-17 NOTE — Addendum Note (Signed)
Addended by: Cleda DaubUCKER, Jacorion Klem G on: 09/17/2014 02:16 PM   Modules accepted: Orders

## 2014-09-17 NOTE — Patient Instructions (Signed)

## 2014-09-18 LAB — CMP14+EGFR
ALK PHOS: 39 IU/L (ref 39–117)
ALT: 22 IU/L (ref 0–44)
AST: 24 IU/L (ref 0–40)
Albumin/Globulin Ratio: 1.6 (ref 1.1–2.5)
Albumin: 4.5 g/dL (ref 3.5–4.7)
BUN / CREAT RATIO: 19 (ref 10–22)
BUN: 27 mg/dL (ref 8–27)
Bilirubin Total: 0.5 mg/dL (ref 0.0–1.2)
CO2: 24 mmol/L (ref 18–29)
Calcium: 9.7 mg/dL (ref 8.6–10.2)
Chloride: 100 mmol/L (ref 97–108)
Creatinine, Ser: 1.4 mg/dL — ABNORMAL HIGH (ref 0.76–1.27)
GFR calc non Af Amer: 47 mL/min/{1.73_m2} — ABNORMAL LOW (ref >59)
GFR, EST AFRICAN AMERICAN: 54 mL/min/{1.73_m2} — AB (ref >59)
GLOBULIN, TOTAL: 2.8 g/dL (ref 1.5–4.5)
Glucose: 103 mg/dL — ABNORMAL HIGH (ref 65–99)
Potassium: 4.5 mmol/L (ref 3.5–5.2)
SODIUM: 140 mmol/L (ref 134–144)
Total Protein: 7.3 g/dL (ref 6.0–8.5)

## 2014-09-18 LAB — NMR, LIPOPROFILE
CHOLESTEROL: 123 mg/dL (ref 100–199)
HDL Cholesterol by NMR: 56 mg/dL (ref >39)
HDL Particle Number: 35.5 umol/L (ref >=30.5)
LDL PARTICLE NUMBER: 721 nmol/L (ref <1000)
LDL Size: 20.8 nm (ref >20.5)
LDL-C: 45 mg/dL (ref 0–99)
LP-IR Score: 55 — ABNORMAL HIGH (ref <=45)
SMALL LDL PARTICLE NUMBER: 185 nmol/L (ref <=527)
TRIGLYCERIDES BY NMR: 112 mg/dL (ref 0–149)

## 2014-12-23 ENCOUNTER — Ambulatory Visit (INDEPENDENT_AMBULATORY_CARE_PROVIDER_SITE_OTHER): Payer: Medicare Other | Admitting: Nurse Practitioner

## 2014-12-23 ENCOUNTER — Encounter: Payer: Self-pay | Admitting: Nurse Practitioner

## 2014-12-23 VITALS — BP 110/72 | HR 98 | Temp 97.4°F | Ht 70.0 in | Wt 192.0 lb

## 2014-12-23 DIAGNOSIS — I4891 Unspecified atrial fibrillation: Secondary | ICD-10-CM | POA: Diagnosis not present

## 2014-12-23 DIAGNOSIS — E785 Hyperlipidemia, unspecified: Secondary | ICD-10-CM | POA: Diagnosis not present

## 2014-12-23 DIAGNOSIS — E559 Vitamin D deficiency, unspecified: Secondary | ICD-10-CM

## 2014-12-23 DIAGNOSIS — Z125 Encounter for screening for malignant neoplasm of prostate: Secondary | ICD-10-CM

## 2014-12-23 DIAGNOSIS — I10 Essential (primary) hypertension: Secondary | ICD-10-CM | POA: Diagnosis not present

## 2014-12-23 DIAGNOSIS — K219 Gastro-esophageal reflux disease without esophagitis: Secondary | ICD-10-CM | POA: Diagnosis not present

## 2014-12-23 DIAGNOSIS — I499 Cardiac arrhythmia, unspecified: Secondary | ICD-10-CM

## 2014-12-23 NOTE — Progress Notes (Signed)
Subjective:    Patient ID: Frank Cook, male    DOB: 1931/11/10, 81 y.o.   MRN: [id number]  HPI  Patient here today for follow up of chronic medical problems. Saw cardiologist for abnomal EKG and said he was fine- has follow up in 6 months  Hypertension This is a chronic problem. The current episode started more than 1 year ago. The problem is unchanged. The problem is controlled. Pertinent negatives include no blurred vision, chest pain, headaches, malaise/fatigue, neck pain, palpitations, peripheral edema or shortness of breath. There are no associated agents to hypertension. Risk factors for coronary artery disease include dyslipidemia and male gender. Past treatments include ACE inhibitors and diuretics. The current treatment provides significant improvement. There are no compliance problems.   Hyperlipidemia This is a chronic problem. The current episode started more than 1 year ago. The problem is controlled. Recent lipid tests were reviewed and are normal. There are no known factors aggravating his hyperlipidemia. Pertinent negatives include no chest pain, focal weakness, leg pain, myalgias or shortness of breath. Current antihyperlipidemic treatment includes statins. The current treatment provides significant improvement of lipids. There are no compliance problems.  Risk factors for coronary artery disease include hypertension and male sex.  Gastrophageal Reflux He reports no chest pain, no heartburn, no hoarse voice, no nausea or no sore throat. This is a chronic problem. The current episode started more than 1 year ago. The problem occurs rarely. The problem has been resolved. Nothing aggravates the symptoms. Pertinent negatives include no fatigue or melena. There are no known risk factors. He has tried an antacid for the symptoms. The treatment provided significant relief.     Review of Systems  Constitutional: Negative.   HENT: Negative.   Respiratory: Negative.   Cardiovascular:  Negative.   Gastrointestinal: Negative.   Genitourinary: Negative.   Neurological: Negative.   Psychiatric/Behavioral: Negative.   All other systems reviewed and are negative.      Objective:   Physical Exam  Constitutional: He is oriented to person, place, and time. He appears well-developed and well-nourished.  HENT:  Head: Normocephalic.  Right Ear: External ear normal.  Left Ear: External ear normal.  Nose: Nose normal.  Mouth/Throat: Oropharynx is clear and moist.  Eyes: EOM are normal. Pupils are equal, round, and reactive to light.  Neck: Normal range of motion. Neck supple. No JVD present. No thyromegaly present.  Cardiovascular: Normal rate, normal heart sounds and intact distal pulses.  Exam reveals no gallop and no friction rub.   No murmur heard. Irregular rhythym  Pulmonary/Chest: Effort normal and breath sounds normal. No respiratory distress. He has no wheezes. He has no rales. He exhibits no tenderness.  Abdominal: Soft. Bowel sounds are normal. He exhibits no mass. There is no tenderness.  Genitourinary: Prostate normal and penis normal.  Musculoskeletal: Normal range of motion. He exhibits no edema.  Lymphadenopathy:    He has no cervical adenopathy.  Neurological: He is alert and oriented to person, place, and time. No cranial nerve deficit.  Skin: Skin is warm and dry.  Psychiatric: He has a normal mood and affect. His behavior is normal. Judgment and thought content normal.    BP 110/72 mmHg  Pulse 98  Temp(Src) 97.4 F (36.3 C) (Oral)  Ht 5' 10"  (1.778 m)  Wt 192 lb (87.091 kg)  BMI 27.55 kg/m2  EKG- new onset atrial fib-Mary-Margaret Hassell Done, FNP CHF- 0 HTN- 1 Age>70- 1 Diabetes- 0 Hx stroke (2)-0 Vascular disease-1 Age >65-1  Sex- male-0 SCORE-4    Assessment & Plan:   1. HYPERTENSION, BENIGN  dash diet - CMP14+EGFR  2. Gastroesophageal reflux disease without esophagitis Avoid spicy foods Do not eat 2 hours prior to bedtime  3.  Vitamin D deficiency  4. Hyperlipidemia Low fat diet - Lipid panel  5. Prostate cancer screening - PSA Total+%Free (Serial)  6. Irregular heart beat  - EKG 12-Lead  7. Atrial FIB Daily ASA- whole asa not baby Follow up with cardiologist   Labs pending Health maintenance reviewed Diet and exercise encouraged Continue all meds Follow up  In 3 month   [place], FNP

## 2014-12-24 LAB — CMP14+EGFR
ALT: 21 IU/L (ref 0–44)
AST: 24 IU/L (ref 0–40)
Albumin/Globulin Ratio: 1.5 (ref 1.1–2.5)
Albumin: 4.3 g/dL (ref 3.5–4.7)
Alkaline Phosphatase: 40 IU/L (ref 39–117)
BUN/Creatinine Ratio: 17 (ref 10–22)
BUN: 28 mg/dL — ABNORMAL HIGH (ref 8–27)
Bilirubin Total: 0.5 mg/dL (ref 0.0–1.2)
CO2: 23 mmol/L (ref 18–29)
Calcium: 10.3 mg/dL — ABNORMAL HIGH (ref 8.6–10.2)
Chloride: 99 mmol/L (ref 97–108)
Creatinine, Ser: 1.64 mg/dL — ABNORMAL HIGH (ref 0.76–1.27)
GFR calc Af Amer: 45 mL/min/1.73 — ABNORMAL LOW
GFR calc non Af Amer: 39 mL/min/1.73 — ABNORMAL LOW
Globulin, Total: 2.8 g/dL (ref 1.5–4.5)
Glucose: 107 mg/dL — ABNORMAL HIGH (ref 65–99)
Potassium: 4.2 mmol/L (ref 3.5–5.2)
Sodium: 140 mmol/L (ref 134–144)
Total Protein: 7.1 g/dL (ref 6.0–8.5)

## 2014-12-24 LAB — LIPID PANEL
Chol/HDL Ratio: 2.1 ratio (ref 0.0–5.0)
Cholesterol, Total: 115 mg/dL (ref 100–199)
HDL: 54 mg/dL
LDL Calculated: 37 mg/dL (ref 0–99)
Triglycerides: 119 mg/dL (ref 0–149)
VLDL Cholesterol Cal: 24 mg/dL (ref 5–40)

## 2014-12-24 LAB — PSA TOTAL+% FREE (SERIAL)
PSA, Free Pct: 38 %
PSA, Free: 0.19 ng/mL
Prostate Specific Ag, Serum: 0.5 ng/mL (ref 0.0–4.0)

## 2015-01-27 ENCOUNTER — Ambulatory Visit (INDEPENDENT_AMBULATORY_CARE_PROVIDER_SITE_OTHER): Payer: Medicare Other | Admitting: Cardiology

## 2015-01-27 ENCOUNTER — Encounter: Payer: Self-pay | Admitting: Cardiology

## 2015-01-27 VITALS — BP 131/78 | HR 108 | Ht 70.0 in | Wt 194.0 lb

## 2015-01-27 DIAGNOSIS — I4819 Other persistent atrial fibrillation: Secondary | ICD-10-CM

## 2015-01-27 DIAGNOSIS — I10 Essential (primary) hypertension: Secondary | ICD-10-CM

## 2015-01-27 DIAGNOSIS — I481 Persistent atrial fibrillation: Secondary | ICD-10-CM

## 2015-01-27 DIAGNOSIS — I4891 Unspecified atrial fibrillation: Secondary | ICD-10-CM | POA: Insufficient documentation

## 2015-01-27 MED ORDER — APIXABAN 2.5 MG PO TABS: 2.5000 mg | ORAL_TABLET | Freq: Two times a day (BID) | ORAL | Status: DC

## 2015-01-27 MED ORDER — METOPROLOL SUCCINATE ER 50 MG PO TB24: 50.0000 mg | ORAL_TABLET | Freq: Every day | ORAL | Status: DC

## 2015-01-27 NOTE — Progress Notes (Signed)
HPI The patient presents for evaluation of an abnormal EKG. The computer reading of this in October suggested atrial fibrillation.  However, this EKG was actually sinus rhythm with PACs. A more recent EKG in July however now demonstrates atrial fibrillation.   I did see him years ago for some atypical chest pain. He had a negative stress test.   The patient actually does not feel palpitations. He does not have presyncope or syncope. He does not have chest pressure, neck or arm discomfort. He does not have weight gain or edema. He denies any shortness of breath, PND or orthopnea. He tries to stay active. He also has a large yard with slopes that he will walk. With this he denies any cardiovascular symptoms.  I reviewed the PCP office notes and EKG from July as well as labs from July .   No Known Allergies  Current Outpatient Prescriptions  Medication Sig Dispense Refill  . aspirin 325 MG tablet Take 325 mg by mouth daily.    . cholecalciferol (VITAMIN D) 1000 UNITS tablet Take 1,000 Units by mouth daily.    . Glucosamine 500 MG CAPS Take 1,500 mg by mouth daily.    . hydrochlorothiazide (HYDRODIURIL) 25 MG tablet Take 1 tablet (25 mg total) by mouth daily. 90 tablet 1  . Omega 3 1000 MG CAPS Take 2,000 mg by mouth daily.    . quinapril (ACCUPRIL) 40 MG tablet Take 1 1/2 tablets by mouth daily    . ranitidine (ZANTAC) 150 MG tablet Take 150 mg by mouth 2 (two) times daily.    . simvastatin (ZOCOR) 20 MG tablet Take 20 mg by mouth daily.     No current facility-administered medications for this visit.    Past Medical History  Diagnosis Date  . Hypertension   . Hyperlipidemia   . GERD (gastroesophageal reflux disease)   . Osteopenia   . Colon polyps     Past Surgical History  Procedure Laterality Date  . None       ROS:  As stated in the HPI and negative for all other systems.  PHYSICAL EXAM BP 131/78 mmHg  Pulse 108  Ht 5\' 10"  (1.778 m)  Wt 194 lb (87.998 kg)  BMI 27.84  kg/m2  GENERAL:  Well appearing HEENT:  Pupils equal round and reactive, fundi not visualized, oral mucosa unremarkable NECK:  No jugular venous distention, waveform within normal limits, carotid upstroke brisk and symmetric, no bruits, no thyromegaly LYMPHATICS:  No cervical, inguinal adenopathy LUNGS:  Clear to auscultation bilaterally BACK:  No CVA tenderness CHEST:  Unremarkable HEART:  PMI not displaced or sustained,S1 and S2 within normal limits, no S3, no clicks, no rubs, no murmurs, irregular  ABD:  Flat, positive bowel sounds normal in frequency in pitch, no bruits, no rebound, no guarding, no midline pulsatile mass, no hepatomegaly, no splenomegaly EXT:  2 plus pulses throughout, no edema, no cyanosis no clubbing SKIN:  No rashes no nodules   EKG:  Atrial fib  rate 106, axis within normal limits, intervals within normal limits, no acute ST-T wave changes.  Low voltage limb leads borderline. 01/27/2015   ASSESSMENT AND PLAN  ATRIAL FIB:   He now demonstrates atrial fibrillation. He's going to stop his aspirin. He's going to start Eliquis 2.5 mg twice daily given his age and creatinine. I will apply a 24-hour Holter monitor to make sure he has reasonable rate control. We discussed the risks benefits of anticoagulation. He agrees to proceed and  has no contraindications.  Frank Cook has a CHA2DS2 - VASc score of 3 with a risk of stroke of 3.2%.  I will also add metoprolol 50 mg daily since he is going relatively fast.   HTN:  Blood pressure is at target.  He will continue with current meds.    CKD:  His creatinine last month was 1.64 which was stable. I reviewed the labs.  He can continue the meds as listed.

## 2015-01-27 NOTE — Patient Instructions (Signed)
Medication Instructions:  Please stop ASA. Start Eliquis 2.5 mg twice a day. Start Metoprolol 50 mg daily. Continue all other medications as listed.  Testing/Procedures: Your physician has recommended that you wear a holter monitor. Holter monitors are medical devices that record the heart's electrical activity. Doctors most often use these monitors to diagnose arrhythmias. Arrhythmias are problems with the speed or rhythm of the heartbeat. The monitor is a small, portable device. You can wear one while you do your normal daily activities. This is usually used to diagnose what is causing palpitations/syncope (passing out).  Follow-Up: Follow up in 4 months with Dr Antoine Poche in [place].  Thank you for choosing [facility]!!

## 2015-02-05 ENCOUNTER — Encounter: Payer: Self-pay | Admitting: Cardiology

## 2015-02-05 ENCOUNTER — Ambulatory Visit (INDEPENDENT_AMBULATORY_CARE_PROVIDER_SITE_OTHER): Payer: Medicare Other | Admitting: *Deleted

## 2015-02-05 DIAGNOSIS — I4819 Other persistent atrial fibrillation: Secondary | ICD-10-CM

## 2015-02-05 DIAGNOSIS — I481 Persistent atrial fibrillation: Secondary | ICD-10-CM | POA: Diagnosis not present

## 2015-02-05 NOTE — Progress Notes (Signed)
Holter monitor placed and patient verbalizes understanding.

## 2015-02-05 NOTE — Patient Instructions (Signed)
Holter Monitoring A Holter monitor is a small device with electrodes (small sticky patches) that attach to your chest. It records the electrical activity of your heart and is worn continuously for 24-48 hours.  A HOLTER MONITOR IS USED TO  Detect heart problems such as:  Heart arrhythmia. Is an abnormal or irregular heartbeat. With some heart arrhythmias, you may not feel or know that you have an irregular heart rhythm.  Palpitations, such as feeling your heart racing or fluttering. It is possible to have heart palpitations and not have a heart arrhythmia.  A heart rhythm that is too slow or too fast.  If you have problems fainting, near fainting or feeling light-headed, a Holter monitor may be worn to see if your heart is the cause. HOLTER MONITOR PREPARATION   Electrodes will be attached to the skin on your chest.  If you have hair on your chest, small areas may have to be shaved. This is done to help the patches stick better and make the recording more accurate.  The electrodes are attached by wires to the Holter monitor. The Holter monitor clips to your clothing. You will wear the monitor at all times, even while exercising and sleeping. HOME CARE INSTRUCTIONS   Wear your monitor at all times.  The wires and the monitor must stay dry. Do not get the monitor wet.  Do not bathe, swim or use a hot tub with it on.  You may do a "sponge" bath while you have the monitor on.  Keep your skin clean, do not put body lotion or moisturizer on your chest.  It's possible that your skin under the electrodes could become irritated. To keep this from happening, you may put the electrodes in slightly different places on your chest.  Your caregiver will also ask you to keep a diary of your activities, such as walking or doing chores. Be sure to note what you are doing if you experience heart symptoms such as palpitations. This will help your caregiver determine what might be contributing to your  symptoms. The information stored in your monitor will be reviewed by your caregiver alongside your diary entries.  Make sure the monitor is safely clipped to your clothing or in a location close to your body that your caregiver recommends.  The monitor and electrodes are removed when the test is over. Return the monitor as directed.  Be sure to follow up with your caregiver and discuss your Holter monitor results. SEEK IMMEDIATE MEDICAL CARE IF:  You faint or feel lightheaded.  You have trouble breathing.  You get pain in your chest, upper arm or jaw.  You feel sick to your stomach and your skin is pale, cool, or damp.  You think something is wrong with the way your heart is beating. MAKE SURE YOU:   Understand these instructions.  Will watch your condition.  Will get help right away if you are not doing well or get worse. Document Released: 02/18/2004 Document Revised: 08/14/2011 Document Reviewed: 07/02/2008 [employer] Patient Information 2015 [employer]. This information is not intended to replace advice given to you by your health care provider. Make sure you discuss any questions you have with your health care provider.  

## 2015-02-10 DIAGNOSIS — I4891 Unspecified atrial fibrillation: Secondary | ICD-10-CM | POA: Diagnosis not present

## 2015-02-19 ENCOUNTER — Telehealth: Payer: Self-pay | Admitting: *Deleted

## 2015-02-19 MED ORDER — METOPROLOL SUCCINATE ER 100 MG PO TB24: 100.0000 mg | ORAL_TABLET | Freq: Every day | ORAL | Status: DC

## 2015-02-19 NOTE — Telephone Encounter (Signed)
Spoke with pt about his metoprolol being increase to 100 mg daily as per Dr Antoine Poche, metoprolol 100 mg was send into pt pharmacy #[phone] refills.

## 2015-03-29 ENCOUNTER — Encounter: Payer: Self-pay | Admitting: Nurse Practitioner

## 2015-03-29 ENCOUNTER — Ambulatory Visit (INDEPENDENT_AMBULATORY_CARE_PROVIDER_SITE_OTHER): Payer: Medicare Other | Admitting: Nurse Practitioner

## 2015-03-29 VITALS — BP 120/83 | HR 80 | Temp 97.5°F | Ht 70.0 in | Wt 193.0 lb

## 2015-03-29 DIAGNOSIS — I4819 Other persistent atrial fibrillation: Secondary | ICD-10-CM

## 2015-03-29 DIAGNOSIS — I481 Persistent atrial fibrillation: Secondary | ICD-10-CM | POA: Diagnosis not present

## 2015-03-29 DIAGNOSIS — I10 Essential (primary) hypertension: Secondary | ICD-10-CM

## 2015-03-29 DIAGNOSIS — K219 Gastro-esophageal reflux disease without esophagitis: Secondary | ICD-10-CM | POA: Diagnosis not present

## 2015-03-29 DIAGNOSIS — E559 Vitamin D deficiency, unspecified: Secondary | ICD-10-CM | POA: Diagnosis not present

## 2015-03-29 DIAGNOSIS — Z6827 Body mass index (BMI) 27.0-27.9, adult: Secondary | ICD-10-CM

## 2015-03-29 DIAGNOSIS — Z23 Encounter for immunization: Secondary | ICD-10-CM | POA: Diagnosis not present

## 2015-03-29 DIAGNOSIS — E785 Hyperlipidemia, unspecified: Secondary | ICD-10-CM | POA: Diagnosis not present

## 2015-03-29 MED ORDER — RANITIDINE HCL 150 MG PO TABS
150.0000 mg | ORAL_TABLET | Freq: Two times a day (BID) | ORAL | Status: DC
Start: 2015-03-29 — End: 2015-07-01

## 2015-03-29 MED ORDER — METOPROLOL SUCCINATE ER 100 MG PO TB24: 100.0000 mg | ORAL_TABLET | Freq: Every day | ORAL | Status: DC

## 2015-03-29 MED ORDER — HYDROCHLOROTHIAZIDE 25 MG PO TABS: 25.0000 mg | ORAL_TABLET | Freq: Every day | ORAL | Status: DC

## 2015-03-29 MED ORDER — QUINAPRIL HCL 40 MG PO TABS: 40.0000 mg | ORAL_TABLET | Freq: Every day | ORAL | Status: DC

## 2015-03-29 MED ORDER — SIMVASTATIN 20 MG PO TABS: 20.0000 mg | ORAL_TABLET | Freq: Every day | ORAL | Status: DC

## 2015-03-29 NOTE — Patient Instructions (Signed)
Fall Prevention in the Home  Falls can cause injuries and can affect people from all age groups. There are many simple things that you can do to make your home safe and to help prevent falls. WHAT CAN I DO ON THE OUTSIDE OF MY HOME?  Regularly repair the edges of walkways and driveways and fix any cracks.  Remove high doorway thresholds.  Trim any shrubbery on the main path into your home.  Use bright outdoor lighting.  Clear walkways of debris and clutter, including tools and rocks.  Regularly check that handrails are securely fastened and in good repair. Both sides of any steps should have handrails.  Install guardrails along the edges of any raised decks or porches.  Have leaves, snow, and ice cleared regularly.  Use sand or salt on walkways during winter months.  In the garage, clean up any spills right away, including grease or oil spills. WHAT CAN I DO IN THE BATHROOM?  Use night lights.  Install grab bars by the toilet and in the tub and shower. Do not use towel bars as grab bars.  Use non-skid mats or decals on the floor of the tub or shower.  If you need to sit down while you are in the shower, use a plastic, non-slip stool..  Keep the floor dry. Immediately clean up any water that spills on the floor.  Remove soap buildup in the tub or shower on a regular basis.  Attach bath mats securely with double-sided non-slip rug tape.  Remove throw rugs and other tripping hazards from the floor. WHAT CAN I DO IN THE BEDROOM?  Use night lights.  Make sure that a bedside light is easy to reach.  Do not use oversized bedding that drapes onto the floor.  Have a firm chair that has side arms to use for getting dressed.  Remove throw rugs and other tripping hazards from the floor. WHAT CAN I DO IN THE KITCHEN?   Clean up any spills right away.  Avoid walking on wet floors.  Place frequently used items in easy-to-reach places.  If you need to reach for something  above you, use a sturdy step stool that has a grab bar.  Keep electrical cables out of the way.  Do not use floor polish or wax that makes floors slippery. If you have to use wax, make sure that it is non-skid floor wax.  Remove throw rugs and other tripping hazards from the floor. WHAT CAN I DO IN THE STAIRWAYS?  Do not leave any items on the stairs.  Make sure that there are handrails on both sides of the stairs. Fix handrails that are broken or loose. Make sure that handrails are as long as the stairways.  Check any carpeting to make sure that it is firmly attached to the stairs. Fix any carpet that is loose or worn.  Avoid having throw rugs at the top or bottom of stairways, or secure the rugs with carpet tape to prevent them from moving.  Make sure that you have a light switch at the top of the stairs and the bottom of the stairs. If you do not have them, have them installed. WHAT ARE SOME OTHER FALL PREVENTION TIPS?  Wear closed-toe shoes that fit well and support your feet. Wear shoes that have rubber soles or low heels.  When you use a stepladder, make sure that it is completely opened and that the sides are firmly locked. Have someone hold the ladder while you   are using it. Do not climb a closed stepladder.  Add color or contrast paint or tape to grab bars and handrails in your home. Place contrasting color strips on the first and last steps.  Use mobility aids as needed, such as canes, walkers, scooters, and crutches.  Turn on lights if it is dark. Replace any light bulbs that burn out.  Set up furniture so that there are clear paths. Keep the furniture in the same spot.  Fix any uneven floor surfaces.  Choose a carpet design that does not hide the edge of steps of a stairway.  Be aware of any and all pets.  Review your medicines with your healthcare provider. Some medicines can cause dizziness or changes in blood pressure, which increase your risk of falling. Talk  with your health care provider about other ways that you can decrease your risk of falls. This may include working with a physical therapist or trainer to improve your strength, balance, and endurance.   This information is not intended to replace advice given to you by your health care provider. Make sure you discuss any questions you have with your health care provider.   Document Released: 05/12/2002 Document Revised: 10/06/2014 Document Reviewed: 06/26/2014 Elsevier Interactive Patient Education 2016 [employer]  

## 2015-03-29 NOTE — Progress Notes (Signed)
Subjective:    Patient ID: Frank Cook, male    DOB: 19-Jun-1931, 81 y.o.   MRN: [id number]  HPI  Patient here today for follow up of chronic medical problems. Saw cardiologist for abnomal EKG and said he was fine- has follow up in 6 months  Hypertension This is a chronic problem. The current episode started more than 1 year ago. The problem is unchanged. The problem is controlled. Pertinent negatives include no blurred vision, chest pain, headaches, malaise/fatigue, neck pain, palpitations, peripheral edema or shortness of breath. There are no associated agents to hypertension. Risk factors for coronary artery disease include dyslipidemia and male gender. Past treatments include ACE inhibitors and diuretics. The current treatment provides significant improvement. There are no compliance problems.   Hyperlipidemia This is a chronic problem. The current episode started more than 1 year ago. The problem is controlled. Recent lipid tests were reviewed and are normal. There are no known factors aggravating his hyperlipidemia. Pertinent negatives include no chest pain, focal weakness, leg pain, myalgias or shortness of breath. Current antihyperlipidemic treatment includes statins. The current treatment provides significant improvement of lipids. There are no compliance problems.  Risk factors for coronary artery disease include hypertension and male sex.  Gastrophageal Reflux He reports no chest pain, no heartburn, no hoarse voice, no nausea or no sore throat. This is a chronic problem. The current episode started more than 1 year ago. The problem occurs rarely. The problem has been resolved. Nothing aggravates the symptoms. Pertinent negatives include no fatigue or melena. There are no known risk factors. He has tried an antacid for the symptoms. The treatment provided significant relief.  Atrial fib Currently on eliquis daily- no c/o palpitations. Sees Dr. Warren Lacy every 6 months.  Review of Systems    Constitutional: Negative.   HENT: Negative.   Respiratory: Negative.   Cardiovascular: Negative.   Gastrointestinal: Negative.   Genitourinary: Negative.   Neurological: Negative.   Psychiatric/Behavioral: Negative.   All other systems reviewed and are negative.      Objective:   Physical Exam  Constitutional: He is oriented to person, place, and time. He appears well-developed and well-nourished.  HENT:  Head: Normocephalic.  Right Ear: External ear normal.  Left Ear: External ear normal.  Nose: Nose normal.  Mouth/Throat: Oropharynx is clear and moist.  Eyes: EOM are normal. Pupils are equal, round, and reactive to light.  Neck: Normal range of motion. Neck supple. No JVD present. No thyromegaly present.  Cardiovascular: Normal rate, normal heart sounds and intact distal pulses.  Exam reveals no gallop and no friction rub.   No murmur heard. Irregular rhythym  Pulmonary/Chest: Effort normal and breath sounds normal. No respiratory distress. He has no wheezes. He has no rales. He exhibits no tenderness.  Abdominal: Soft. Bowel sounds are normal. He exhibits no mass. There is no tenderness.  Genitourinary: Prostate normal and penis normal.  Musculoskeletal: Normal range of motion. He exhibits no edema.  Lymphadenopathy:    He has no cervical adenopathy.  Neurological: He is alert and oriented to person, place, and time. No cranial nerve deficit.  Skin: Skin is warm and dry.  Psychiatric: He has a normal mood and affect. His behavior is normal. Judgment and thought content normal.    BP 120/83 mmHg  Pulse 80  Temp(Src) 97.5 F (36.4 C) (Oral)  Ht _0  (1.778 m)  Wt 193 lb (87.544 kg)  BMI 27.69 kg/m2   Assessment & Plan:   1. HYPERTENSION,  BENIGN Do not add salt to diet - hydrochlorothiazide (HYDRODIURIL) 25 MG tablet; Take 1 tablet (25 mg total) by mouth daily.  Dispense: 90 tablet; Refill: 1 - quinapril (ACCUPRIL) 40 MG tablet; Take 1 tablet (40 mg total) by  mouth daily. Take 1 1/2 tablets by mouth daily  Dispense: 45 tablet; Refill: 6 - metoprolol succinate (TOPROL-XL) 100 MG 24 hr tablet; Take 1 tablet (100 mg total) by mouth daily. Take with or immediately following a meal.  Dispense: 90 tablet; Refill: 3 - CMP14+EGFR  2. Persistent atrial fibrillation ([place]) Keep follow up with cardiologist  3. Gastroesophageal reflux disease without esophagitis Avoid spicy foods Do not eat 2 hours prior to bedtime  - ranitidine (ZANTAC) 150 MG tablet; Take 1 tablet (150 mg total) by mouth 2 (two) times daily.  Dispense: 30 tablet; Refill: 6  4. Hyperlipidemia Low fat diet - simvastatin (ZOCOR) 20 MG tablet; Take 1 tablet (20 mg total) by mouth daily.  Dispense: 30 tablet; Refill: 6 - Lipid panel  5. Vitamin D deficiency  6. BMI 27.0-27.9,adult Discussed diet and exercise for person with BMI >25 Will recheck weight in 3-6 months     Labs pending Health maintenance reviewed Diet and exercise encouraged Continue all meds Follow up  In 3 month   [place], FNP

## 2015-03-30 LAB — LIPID PANEL
CHOLESTEROL TOTAL: 120 mg/dL (ref 100–199)
Chol/HDL Ratio: 2.4 ratio units (ref 0.0–5.0)
HDL: 51 mg/dL (ref >39)
LDL CALC: 46 mg/dL (ref 0–99)
Triglycerides: 115 mg/dL (ref 0–149)
VLDL CHOLESTEROL CAL: 23 mg/dL (ref 5–40)

## 2015-03-30 LAB — CMP14+EGFR
ALBUMIN: 4.3 g/dL (ref 3.5–4.7)
ALT: 19 IU/L (ref 0–44)
AST: 24 IU/L (ref 0–40)
Albumin/Globulin Ratio: 1.5 (ref 1.1–2.5)
Alkaline Phosphatase: 43 IU/L (ref 39–117)
BILIRUBIN TOTAL: 0.6 mg/dL (ref 0.0–1.2)
BUN / CREAT RATIO: 15 (ref 10–22)
BUN: 22 mg/dL (ref 8–27)
CHLORIDE: 102 mmol/L (ref 97–106)
CO2: 24 mmol/L (ref 18–29)
CREATININE: 1.48 mg/dL — AB (ref 0.76–1.27)
Calcium: 9.9 mg/dL (ref 8.6–10.2)
GFR calc non Af Amer: 44 mL/min/{1.73_m2} — ABNORMAL LOW (ref >59)
GFR, EST AFRICAN AMERICAN: 51 mL/min/{1.73_m2} — AB (ref >59)
GLUCOSE: 101 mg/dL — AB (ref 65–99)
Globulin, Total: 2.8 g/dL (ref 1.5–4.5)
Potassium: 4.1 mmol/L (ref 3.5–5.2)
Sodium: 147 mmol/L — ABNORMAL HIGH (ref 136–144)
TOTAL PROTEIN: 7.1 g/dL (ref 6.0–8.5)

## 2015-05-13 DIAGNOSIS — H2513 Age-related nuclear cataract, bilateral: Secondary | ICD-10-CM | POA: Diagnosis not present

## 2015-05-19 ENCOUNTER — Ambulatory Visit: Payer: Medicare Other | Admitting: Cardiology

## 2015-06-30 ENCOUNTER — Encounter: Payer: Self-pay | Admitting: Cardiology

## 2015-06-30 ENCOUNTER — Ambulatory Visit (INDEPENDENT_AMBULATORY_CARE_PROVIDER_SITE_OTHER): Payer: Medicare Other | Admitting: Cardiology

## 2015-06-30 VITALS — BP 131/84 | HR 76 | Ht 70.0 in | Wt 196.0 lb

## 2015-06-30 DIAGNOSIS — I10 Essential (primary) hypertension: Secondary | ICD-10-CM

## 2015-06-30 NOTE — Progress Notes (Signed)
HPI The patient presented for evaluation of an abnormal EKG. The computer reading of this in October suggested atrial fibrillation.  However, this EKG was actually sinus rhythm with PACs. A more recent EKG in July demonstrated atrial fibrillation.  I stopped ASA at the last visit and he has been on Eliquis.    He did wear a Holter afterward and had good rate control.  Since I last saw him he has done well.  The patient denies any new symptoms such as chest discomfort, neck or arm discomfort. There has been no new shortness of breath, PND or orthopnea. There have been no reported palpitations, presyncope or syncope.  He does not notice his fibrillation.  No Known Allergies  Current Outpatient Prescriptions  Medication Sig Dispense Refill  . apixaban (ELIQUIS) 2.5 MG TABS tablet Take 1 tablet (2.5 mg total) by mouth 2 (two) times daily. 60 tablet 6  . cholecalciferol (VITAMIN D) 1000 UNITS tablet Take 1,000 Units by mouth daily.    . Glucosamine 500 MG CAPS Take 1,500 mg by mouth daily.    Marland Kitchen glucosamine-chondroitin 500-400 MG tablet Take 1 tablet by mouth 3 (three) times daily.    . hydrochlorothiazide (HYDRODIURIL) 25 MG tablet Take 1 tablet (25 mg total) by mouth daily. 90 tablet 1  . metoprolol succinate (TOPROL-XL) 100 MG 24 hr tablet Take 1 tablet (100 mg total) by mouth daily. Take with or immediately following a meal. 90 tablet 3  . Omega 3 1000 MG CAPS Take 2,000 mg by mouth daily.    . quinapril (ACCUPRIL) 40 MG tablet Take 1 tablet (40 mg total) by mouth daily. Take 1 1/2 tablets by mouth daily 45 tablet 6  . ranitidine (ZANTAC) 150 MG tablet Take 1 tablet (150 mg total) by mouth 2 (two) times daily. 30 tablet 6  . simvastatin (ZOCOR) 20 MG tablet Take 1 tablet (20 mg total) by mouth daily. 30 tablet 6   No current facility-administered medications for this visit.    Past Medical History  Diagnosis Date  . Hypertension   . Hyperlipidemia   . GERD (gastroesophageal reflux disease)    . Osteopenia   . Colon polyps     Past Surgical History  Procedure Laterality Date  . None       ROS:  As stated in the HPI and negative for all other systems.  PHYSICAL EXAM BP 131/84 mmHg  Pulse 76  Ht  (1.778 m)  Wt 196 lb (88.905 kg)  BMI 28.12 kg/m2  GENERAL:  Well appearing NECK:  No jugular venous distention, waveform within normal limits, carotid upstroke brisk and symmetric, no bruits, no thyromegaly LUNGS:  Clear to auscultation bilaterally BACK:  No CVA tenderness CHEST:  Unremarkable HEART:  PMI not displaced or sustained,S1 and S2 within normal limits, no S3, no clicks, no rubs, no murmurs, irregular  ABD:  Flat, positive bowel sounds normal in frequency in pitch, no bruits, no rebound, no guarding, no midline pulsatile mass, no hepatomegaly, no splenomegaly EXT:  2 plus pulses throughout, no edema, no cyanosis no clubbing SKIN:  No rashes no nodules   EKG:  Atrial fib  rate 76 , axis within normal limits, intervals within normal limits, no acute ST-T wave changes.  Low voltage limb leads borderline. 06/30/2015   ASSESSMENT AND PLAN  ATRIAL FIB:    [name] has a CHA2DS2 - VASc score of 3 with a risk of stroke of 3.2%.  I will also  add metoprolol 50 mg daily since he is going relatively fast.  The patient  tolerates this rhythm and rate control and anticoagulation. We will continue with the meds as listed.  HTN:  Blood pressure is at target.  He will continue with current meds.    CKD:  His creatinine last month was 1.48 which was stable. I reviewed the labs.  He can continue the meds as listed.

## 2015-06-30 NOTE — Patient Instructions (Signed)

## 2015-07-01 ENCOUNTER — Encounter: Payer: Self-pay | Admitting: Nurse Practitioner

## 2015-07-01 ENCOUNTER — Ambulatory Visit (INDEPENDENT_AMBULATORY_CARE_PROVIDER_SITE_OTHER): Payer: Medicare Other | Admitting: Nurse Practitioner

## 2015-07-01 VITALS — BP 132/89 | HR 95 | Temp 97.2°F | Ht 70.0 in | Wt 195.0 lb

## 2015-07-01 DIAGNOSIS — E559 Vitamin D deficiency, unspecified: Secondary | ICD-10-CM

## 2015-07-01 DIAGNOSIS — E785 Hyperlipidemia, unspecified: Secondary | ICD-10-CM

## 2015-07-01 DIAGNOSIS — I481 Persistent atrial fibrillation: Secondary | ICD-10-CM

## 2015-07-01 DIAGNOSIS — K219 Gastro-esophageal reflux disease without esophagitis: Secondary | ICD-10-CM

## 2015-07-01 DIAGNOSIS — Z6827 Body mass index (BMI) 27.0-27.9, adult: Secondary | ICD-10-CM

## 2015-07-01 DIAGNOSIS — I10 Essential (primary) hypertension: Secondary | ICD-10-CM | POA: Diagnosis not present

## 2015-07-01 DIAGNOSIS — I4819 Other persistent atrial fibrillation: Secondary | ICD-10-CM

## 2015-07-01 MED ORDER — QUINAPRIL HCL 40 MG PO TABS: 40.0000 mg | ORAL_TABLET | Freq: Every day | ORAL | Status: DC

## 2015-07-01 MED ORDER — RANITIDINE HCL 150 MG PO TABS: 150.0000 mg | ORAL_TABLET | Freq: Two times a day (BID) | ORAL | Status: DC

## 2015-07-01 MED ORDER — SIMVASTATIN 20 MG PO TABS: 20.0000 mg | ORAL_TABLET | Freq: Every day | ORAL | Status: DC

## 2015-07-01 NOTE — Patient Instructions (Signed)
Fall Prevention in the Home  Falls can cause injuries and can affect people from all age groups. There are many simple things that you can do to make your home safe and to help prevent falls. WHAT CAN I DO ON THE OUTSIDE OF MY HOME?  Regularly repair the edges of walkways and driveways and fix any cracks.  Remove high doorway thresholds.  Trim any shrubbery on the main path into your home.  Use bright outdoor lighting.  Clear walkways of debris and clutter, including tools and rocks.  Regularly check that handrails are securely fastened and in good repair. Both sides of any steps should have handrails.  Install guardrails along the edges of any raised decks or porches.  Have leaves, snow, and ice cleared regularly.  Use sand or salt on walkways during winter months.  In the garage, clean up any spills right away, including grease or oil spills. WHAT CAN I DO IN THE BATHROOM?  Use night lights.  Install grab bars by the toilet and in the tub and shower. Do not use towel bars as grab bars.  Use non-skid mats or decals on the floor of the tub or shower.  If you need to sit down while you are in the shower, use a plastic, non-slip stool..  Keep the floor dry. Immediately clean up any water that spills on the floor.  Remove soap buildup in the tub or shower on a regular basis.  Attach bath mats securely with double-sided non-slip rug tape.  Remove throw rugs and other tripping hazards from the floor. WHAT CAN I DO IN THE BEDROOM?  Use night lights.  Make sure that a bedside light is easy to reach.  Do not use oversized bedding that drapes onto the floor.  Have a firm chair that has side arms to use for getting dressed.  Remove throw rugs and other tripping hazards from the floor. WHAT CAN I DO IN THE KITCHEN?   Clean up any spills right away.  Avoid walking on wet floors.  Place frequently used items in easy-to-reach places.  If you need to reach for something  above you, use a sturdy step stool that has a grab bar.  Keep electrical cables out of the way.  Do not use floor polish or wax that makes floors slippery. If you have to use wax, make sure that it is non-skid floor wax.  Remove throw rugs and other tripping hazards from the floor. WHAT CAN I DO IN THE STAIRWAYS?  Do not leave any items on the stairs.  Make sure that there are handrails on both sides of the stairs. Fix handrails that are broken or loose. Make sure that handrails are as long as the stairways.  Check any carpeting to make sure that it is firmly attached to the stairs. Fix any carpet that is loose or worn.  Avoid having throw rugs at the top or bottom of stairways, or secure the rugs with carpet tape to prevent them from moving.  Make sure that you have a light switch at the top of the stairs and the bottom of the stairs. If you do not have them, have them installed. WHAT ARE SOME OTHER FALL PREVENTION TIPS?  Wear closed-toe shoes that fit well and support your feet. Wear shoes that have rubber soles or low heels.  When you use a stepladder, make sure that it is completely opened and that the sides are firmly locked. Have someone hold the ladder while you   are using it. Do not climb a closed stepladder.  Add color or contrast paint or tape to grab bars and handrails in your home. Place contrasting color strips on the first and last steps.  Use mobility aids as needed, such as canes, walkers, scooters, and crutches.  Turn on lights if it is dark. Replace any light bulbs that burn out.  Set up furniture so that there are clear paths. Keep the furniture in the same spot.  Fix any uneven floor surfaces.  Choose a carpet design that does not hide the edge of steps of a stairway.  Be aware of any and all pets.  Review your medicines with your healthcare provider. Some medicines can cause dizziness or changes in blood pressure, which increase your risk of falling. Talk  with your health care provider about other ways that you can decrease your risk of falls. This may include working with a physical therapist or trainer to improve your strength, balance, and endurance.   This information is not intended to replace advice given to you by your health care provider. Make sure you discuss any questions you have with your health care provider.   Document Released: 05/12/2002 Document Revised: 10/06/2014 Document Reviewed: 06/26/2014 Elsevier Interactive Patient Education 2016 [employer]  

## 2015-07-01 NOTE — Progress Notes (Signed)
Subjective:    Patient ID: Frank Cook, male    DOB: Oct 09, 1931, 82 y.o.   MRN: [id number]  HPI   Patient here today for follow up of chronic medical problems.  Outpatient Encounter Prescriptions as of 07/01/2015  Medication Sig  . apixaban (ELIQUIS) 2.5 MG TABS tablet Take 1 tablet (2.5 mg total) by mouth 2 (two) times daily.  . cholecalciferol (VITAMIN D) 1000 UNITS tablet Take 1,000 Units by mouth daily.  . Glucosamine 500 MG CAPS Take 1,500 mg by mouth daily.  Marland Kitchen glucosamine-chondroitin 500-400 MG tablet Take 1 tablet by mouth 3 (three) times daily.  . hydrochlorothiazide (HYDRODIURIL) 25 MG tablet Take 1 tablet (25 mg total) by mouth daily.  . metoprolol succinate (TOPROL-XL) 100 MG 24 hr tablet Take 1 tablet (100 mg total) by mouth daily. Take with or immediately following a meal.  . Omega 3 1000 MG CAPS Take 2,000 mg by mouth daily.  . quinapril (ACCUPRIL) 40 MG tablet Take 1 tablet (40 mg total) by mouth daily. Take 1 1/2 tablets by mouth daily  . ranitidine (ZANTAC) 150 MG tablet Take 1 tablet (150 mg total) by mouth 2 (two) times daily.  . simvastatin (ZOCOR) 20 MG tablet Take 1 tablet (20 mg total) by mouth daily.   No facility-administered encounter medications on file as of 07/01/2015.     Hypertension This is a chronic problem. The current episode started more than 1 year ago. The problem is unchanged. The problem is controlled. Pertinent negatives include no blurred vision, chest pain, headaches, malaise/fatigue, neck pain, palpitations, peripheral edema or shortness of breath. There are no associated agents to hypertension. Risk factors for coronary artery disease include dyslipidemia and male gender. Past treatments include ACE inhibitors and diuretics. The current treatment provides significant improvement. There are no compliance problems.   Hyperlipidemia This is a chronic problem. The current episode started more than 1 year ago. The problem is controlled. Recent  lipid tests were reviewed and are normal. There are no known factors aggravating his hyperlipidemia. Pertinent negatives include no chest pain, focal weakness, leg pain, myalgias or shortness of breath. Current antihyperlipidemic treatment includes statins. The current treatment provides significant improvement of lipids. There are no compliance problems.  Risk factors for coronary artery disease include hypertension and male sex.  Gastrophageal Reflux He reports no chest pain, no heartburn, no hoarse voice, no nausea or no sore throat. This is a chronic problem. The current episode started more than 1 year ago. The problem occurs rarely. The problem has been resolved. Nothing aggravates the symptoms. Pertinent negatives include no fatigue or melena. There are no known risk factors. He has tried an antacid for the symptoms. The treatment provided significant relief.  Atrial fib Currently on eliquis daily- no c/o palpitations. Saw Dr. Warren Lacy yesterday  Review of Systems  Constitutional: Negative.   HENT: Negative.   Respiratory: Negative.   Cardiovascular: Negative.   Gastrointestinal: Negative.   Genitourinary: Negative.   Neurological: Negative.   Psychiatric/Behavioral: Negative.   All other systems reviewed and are negative.      Objective:   Physical Exam  Constitutional: He is oriented to person, place, and time. He appears well-developed and well-nourished.  HENT:  Head: Normocephalic.  Right Ear: External ear normal.  Left Ear: External ear normal.  Nose: Nose normal.  Mouth/Throat: Oropharynx is clear and moist.  Eyes: EOM are normal. Pupils are equal, round, and reactive to light.  Neck: Normal range of motion. Neck supple.  No JVD present. No thyromegaly present.  Cardiovascular: Normal rate, normal heart sounds and intact distal pulses.  Exam reveals no gallop and no friction rub.   No murmur heard. Regular Irregular rhythym  Pulmonary/Chest: Effort normal and breath  sounds normal. No respiratory distress. He has no wheezes. He has no rales. He exhibits no tenderness.  Abdominal: Soft. Bowel sounds are normal. He exhibits no mass. There is no tenderness.  Genitourinary: Prostate normal and penis normal.  Musculoskeletal: Normal range of motion. He exhibits no edema.  Lymphadenopathy:    He has no cervical adenopathy.  Neurological: He is alert and oriented to person, place, and time. No cranial nerve deficit.  Skin: Skin is warm and dry.  Psychiatric: He has a normal mood and affect. His behavior is normal. Judgment and thought content normal.    BP 132/89 mmHg  Pulse 95  Temp(Src) 97.2 F (36.2 C) (Oral)  Ht 5' 10" (1.778 m)  Wt 195 lb (88.451 kg)  BMI 27.98 kg/m2   Assessment & Plan:  1. HYPERTENSION, BENIGN Do not add salt diet - quinapril (ACCUPRIL) 40 MG tablet; Take 1 tablet (40 mg total) by mouth daily. Take 1 1/2 tablets by mouth daily  Dispense: 135 tablet; Refill: 1 - CMP14+EGFR  2. Persistent atrial fibrillation ([place]) Keep follow up with cardiologist  3. Gastroesophageal reflux disease without esophagitis Avoid spicy foods Do not eat 2 hours prior to bedtime - ranitidine (ZANTAC) 150 MG tablet; Take 1 tablet (150 mg total) by mouth 2 (two) times daily.  Dispense: 90 tablet; Refill: 1  4. Hyperlipidemia Low fat diet - simvastatin (ZOCOR) 20 MG tablet; Take 1 tablet (20 mg total) by mouth daily.  Dispense: 90 tablet; Refill: 1 - Lipid panel  5. Vitamin D deficiency  6. BMI 27.0-27.9,adult Discussed diet and exercise for person with BMI >25 Will recheck weight in 3-6 months     Labs pending Health maintenance reviewed Diet and exercise encouraged Continue all meds Follow up  In 3 months   [place], FNP

## 2015-07-02 LAB — CMP14+EGFR
A/G RATIO: 1.4 (ref 1.1–2.5)
ALBUMIN: 4.3 g/dL (ref 3.5–4.7)
ALK PHOS: 42 IU/L (ref 39–117)
ALT: 20 IU/L (ref 0–44)
AST: 24 IU/L (ref 0–40)
BILIRUBIN TOTAL: 0.6 mg/dL (ref 0.0–1.2)
BUN / CREAT RATIO: 13 (ref 10–22)
BUN: 20 mg/dL (ref 8–27)
CO2: 24 mmol/L (ref 18–29)
Calcium: 9.7 mg/dL (ref 8.6–10.2)
Chloride: 102 mmol/L (ref 96–106)
Creatinine, Ser: 1.6 mg/dL — ABNORMAL HIGH (ref 0.76–1.27)
GFR calc Af Amer: 46 mL/min/{1.73_m2} — ABNORMAL LOW (ref >59)
GFR calc non Af Amer: 40 mL/min/{1.73_m2} — ABNORMAL LOW (ref >59)
GLOBULIN, TOTAL: 3 g/dL (ref 1.5–4.5)
Glucose: 102 mg/dL — ABNORMAL HIGH (ref 65–99)
POTASSIUM: 4.1 mmol/L (ref 3.5–5.2)
SODIUM: 145 mmol/L — AB (ref 134–144)
Total Protein: 7.3 g/dL (ref 6.0–8.5)

## 2015-07-02 LAB — LIPID PANEL
CHOLESTEROL TOTAL: 129 mg/dL (ref 100–199)
Chol/HDL Ratio: 2.4 ratio units (ref 0.0–5.0)
HDL: 53 mg/dL (ref >39)
LDL CALC: 52 mg/dL (ref 0–99)
TRIGLYCERIDES: 120 mg/dL (ref 0–149)
VLDL Cholesterol Cal: 24 mg/dL (ref 5–40)

## 2015-07-12 IMAGING — CR DG CHEST 2V
2 series · 2 of 2 positions shown · non-contrast
Comparison: None.

CLINICAL DATA: Essential hypertension, benign

EXAM:
CHEST  2 VIEW

[view not recorded (1 of 2)]
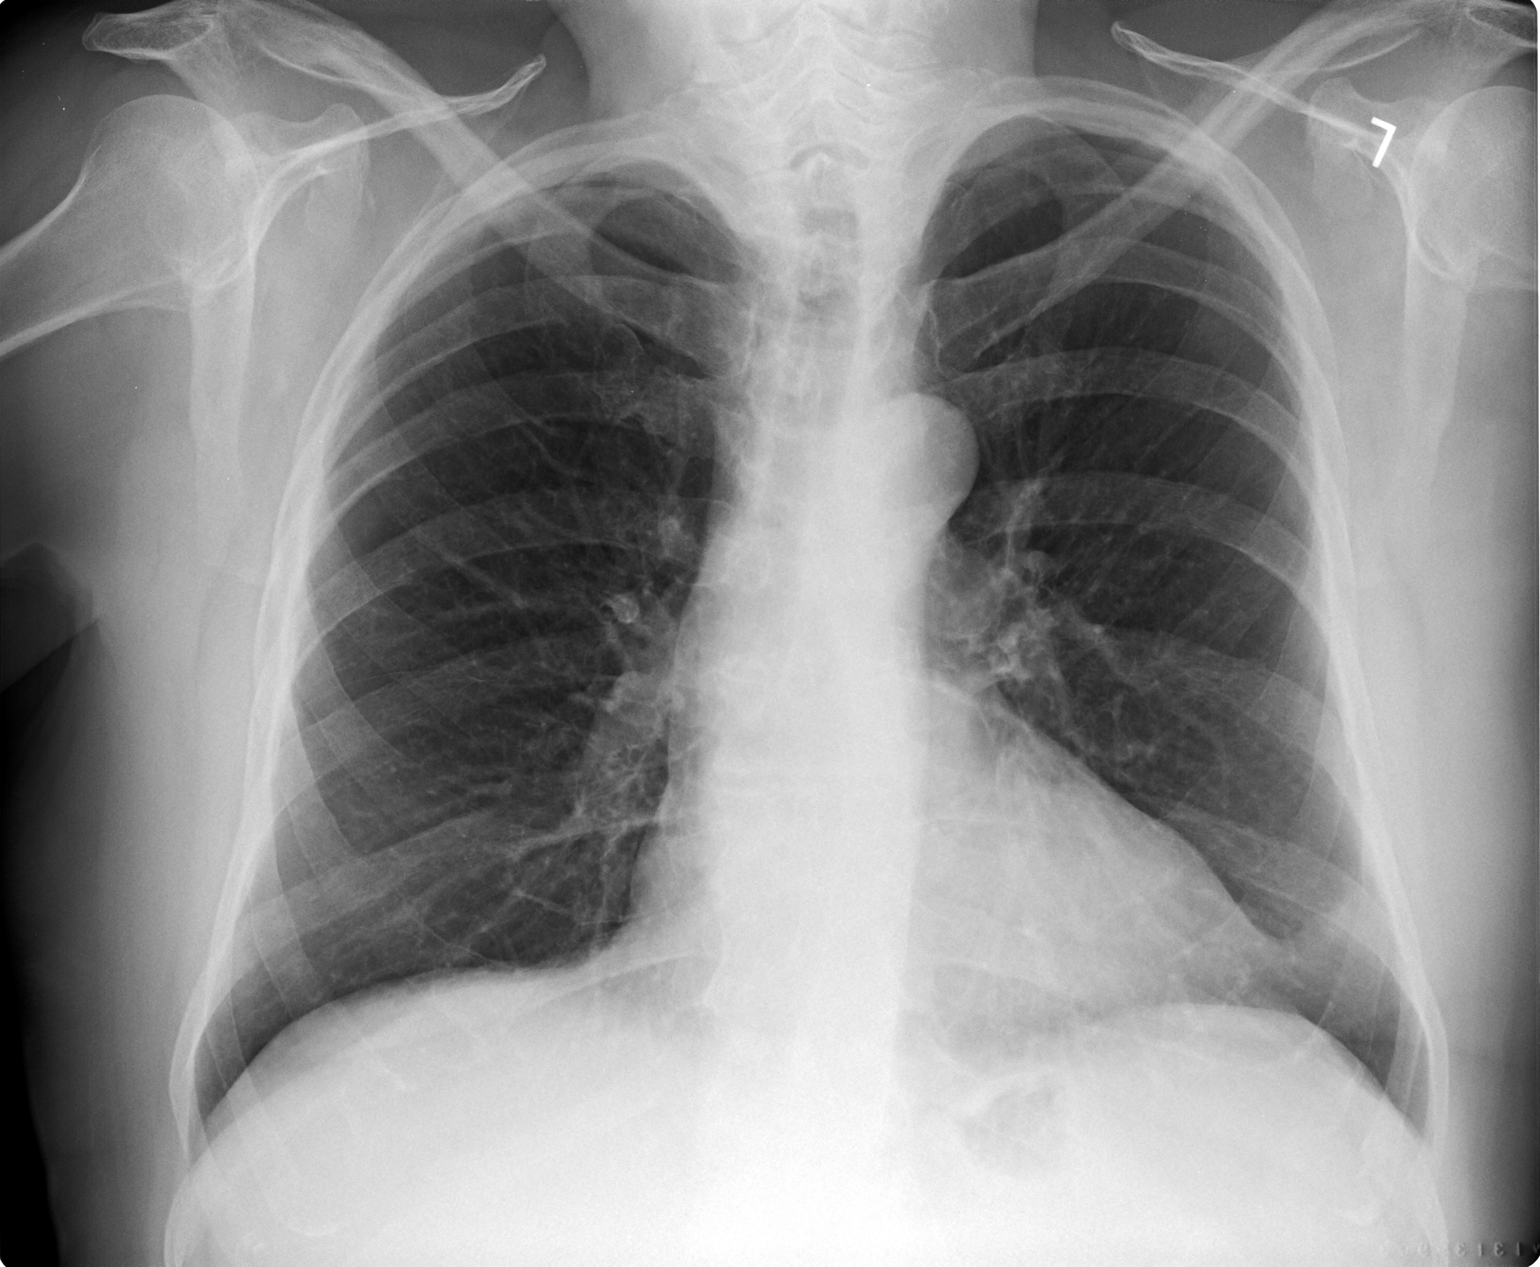

[view not recorded (2 of 2)]
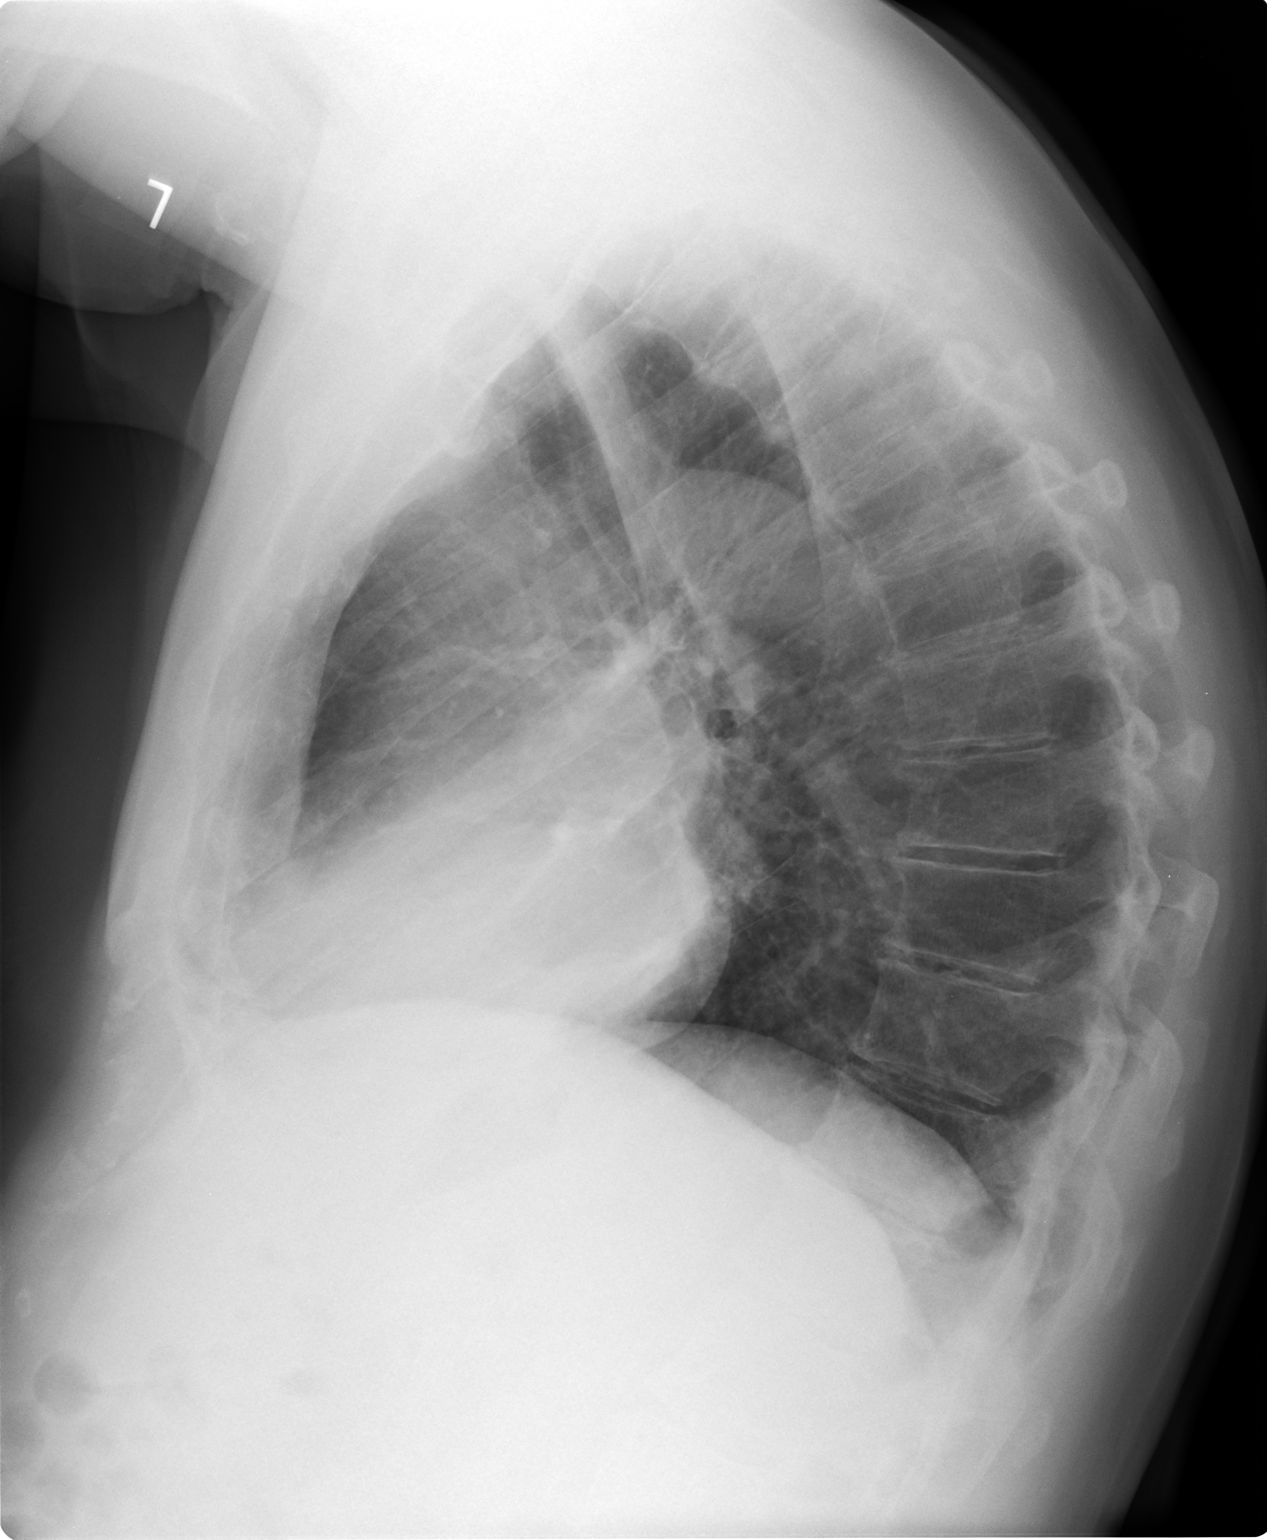

[2 of 2 positions shown; findings below may reference images not displayed]

FINDINGS: The heart size and mediastinal contours are within normal limits.
Both lungs are clear. The visualized skeletal structures are
unremarkable.
IMPRESSION: No active cardiopulmonary disease.

## 2015-08-16 ENCOUNTER — Other Ambulatory Visit: Payer: Self-pay | Admitting: Cardiology

## 2015-09-16 ENCOUNTER — Other Ambulatory Visit: Payer: Self-pay | Admitting: Nurse Practitioner

## 2015-09-29 ENCOUNTER — Ambulatory Visit (INDEPENDENT_AMBULATORY_CARE_PROVIDER_SITE_OTHER): Payer: Medicare Other | Admitting: Nurse Practitioner

## 2015-09-29 ENCOUNTER — Ambulatory Visit (INDEPENDENT_AMBULATORY_CARE_PROVIDER_SITE_OTHER): Payer: Medicare Other

## 2015-09-29 ENCOUNTER — Encounter: Payer: Self-pay | Admitting: Nurse Practitioner

## 2015-09-29 VITALS — BP 118/76 | HR 76 | Temp 96.6°F | Ht 70.0 in | Wt 196.0 lb

## 2015-09-29 DIAGNOSIS — I10 Essential (primary) hypertension: Secondary | ICD-10-CM

## 2015-09-29 DIAGNOSIS — K219 Gastro-esophageal reflux disease without esophagitis: Secondary | ICD-10-CM | POA: Diagnosis not present

## 2015-09-29 DIAGNOSIS — I4819 Other persistent atrial fibrillation: Secondary | ICD-10-CM

## 2015-09-29 DIAGNOSIS — I481 Persistent atrial fibrillation: Secondary | ICD-10-CM | POA: Diagnosis not present

## 2015-09-29 DIAGNOSIS — E785 Hyperlipidemia, unspecified: Secondary | ICD-10-CM

## 2015-09-29 DIAGNOSIS — E559 Vitamin D deficiency, unspecified: Secondary | ICD-10-CM

## 2015-09-29 DIAGNOSIS — Z6827 Body mass index (BMI) 27.0-27.9, adult: Secondary | ICD-10-CM | POA: Diagnosis not present

## 2015-09-29 LAB — LIPID PANEL
Chol/HDL Ratio: 2.5 ratio units (ref 0.0–5.0)
Cholesterol, Total: 126 mg/dL (ref 100–199)
HDL: 51 mg/dL (ref >39)
LDL Calculated: 49 mg/dL (ref 0–99)
Triglycerides: 129 mg/dL (ref 0–149)
VLDL Cholesterol Cal: 26 mg/dL (ref 5–40)

## 2015-09-29 LAB — CMP14+EGFR
A/G RATIO: 1.5 (ref 1.2–2.2)
ALBUMIN: 4.5 g/dL (ref 3.5–4.7)
ALK PHOS: 46 IU/L (ref 39–117)
ALT: 23 IU/L (ref 0–44)
AST: 30 IU/L (ref 0–40)
BUN / CREAT RATIO: 13 (ref 10–24)
BUN: 20 mg/dL (ref 8–27)
Bilirubin Total: 0.9 mg/dL (ref 0.0–1.2)
CALCIUM: 10 mg/dL (ref 8.6–10.2)
CO2: 21 mmol/L (ref 18–29)
CREATININE: 1.55 mg/dL — AB (ref 0.76–1.27)
Chloride: 100 mmol/L (ref 96–106)
GFR calc Af Amer: 48 mL/min/{1.73_m2} — ABNORMAL LOW (ref >59)
GFR calc non Af Amer: 41 mL/min/{1.73_m2} — ABNORMAL LOW (ref >59)
GLOBULIN, TOTAL: 3 g/dL (ref 1.5–4.5)
Glucose: 103 mg/dL — ABNORMAL HIGH (ref 65–99)
POTASSIUM: 4 mmol/L (ref 3.5–5.2)
SODIUM: 140 mmol/L (ref 134–144)
Total Protein: 7.5 g/dL (ref 6.0–8.5)

## 2015-09-29 MED ORDER — SIMVASTATIN 20 MG PO TABS: 20.0000 mg | ORAL_TABLET | Freq: Every day | ORAL | Status: DC

## 2015-09-29 MED ORDER — HYDROCHLOROTHIAZIDE 25 MG PO TABS: 25.0000 mg | ORAL_TABLET | Freq: Every day | ORAL | Status: DC

## 2015-09-29 MED ORDER — QUINAPRIL HCL 40 MG PO TABS: 40.0000 mg | ORAL_TABLET | Freq: Every day | ORAL | Status: DC

## 2015-09-29 MED ORDER — APIXABAN 2.5 MG PO TABS: ORAL_TABLET | ORAL | Status: DC

## 2015-09-29 NOTE — Progress Notes (Signed)
Subjective:    Patient ID: Frank Cook, male    DOB: Apr 13, 1932, 82 y.o.   MRN: [id number]  HPI   Patient here today for follow up of chronic medical problems.  Outpatient Encounter Prescriptions as of 09/29/2015  Medication Sig  . cholecalciferol (VITAMIN D) 1000 UNITS tablet Take 1,000 Units by mouth daily.  Marland Kitchen ELIQUIS 2.5 MG TABS tablet Take 1 tablet (2.5 mg total) by mouth 2 (two) times daily.  . Glucosamine 500 MG CAPS Take 1,500 mg by mouth daily.  Marland Kitchen glucosamine-chondroitin 500-400 MG tablet Take 1 tablet by mouth 3 (three) times daily.  . hydrochlorothiazide (HYDRODIURIL) 25 MG tablet Take 1 tablet (25 mg total) by mouth daily.  . metoprolol succinate (TOPROL-XL) 100 MG 24 hr tablet Take 1 tablet (100 mg total) by mouth daily. Take with or immediately following a meal.  . Omega 3 1000 MG CAPS Take 2,000 mg by mouth daily.  . quinapril (ACCUPRIL) 40 MG tablet Take 1 tablet (40 mg total) by mouth daily. Take 1 1/2 tablets by mouth daily  . ranitidine (ZANTAC) 150 MG tablet Take 1 tablet (150 mg total) by mouth 2 (two) times daily.  . simvastatin (ZOCOR) 20 MG tablet Take 1 tablet (20 mg total) by mouth daily.   No facility-administered encounter medications on file as of 09/29/2015.   * NO changes since last visit  Hypertension This is a chronic problem. The current episode started more than 1 year ago. The problem is unchanged. The problem is controlled. Pertinent negatives include no blurred vision, chest pain, headaches, malaise/fatigue, neck pain, palpitations, peripheral edema or shortness of breath. There are no associated agents to hypertension. Risk factors for coronary artery disease include dyslipidemia and male gender. Past treatments include ACE inhibitors and diuretics. The current treatment provides significant improvement. There are no compliance problems.   Hyperlipidemia This is a chronic problem. The current episode started more than 1 year ago. The problem is  controlled. Recent lipid tests were reviewed and are normal. There are no known factors aggravating his hyperlipidemia. Pertinent negatives include no chest pain, focal weakness, leg pain, myalgias or shortness of breath. Current antihyperlipidemic treatment includes statins. The current treatment provides significant improvement of lipids. There are no compliance problems.  Risk factors for coronary artery disease include hypertension and male sex.  Gastrophageal Reflux He reports no chest pain, no heartburn, no hoarse voice, no nausea or no sore throat. This is a chronic problem. The current episode started more than 1 year ago. The problem occurs rarely. The problem has been resolved. Nothing aggravates the symptoms. Pertinent negatives include no fatigue or melena. There are no known risk factors. He has tried an antacid for the symptoms. The treatment provided significant relief.  Atrial fib Currently on eliquis daily- no c/o palpitations. Saw Dr. Warren Lacy yesterday  Review of Systems  Constitutional: Negative.   HENT: Negative.   Respiratory: Negative.   Cardiovascular: Negative.   Gastrointestinal: Negative.   Genitourinary: Negative.   Neurological: Negative.   Psychiatric/Behavioral: Negative.   All other systems reviewed and are negative.      Objective:   Physical Exam  Constitutional: He is oriented to person, place, and time. He appears well-developed and well-nourished.  HENT:  Head: Normocephalic.  Right Ear: External ear normal.  Left Ear: External ear normal.  Nose: Nose normal.  Mouth/Throat: Oropharynx is clear and moist.  Eyes: EOM are normal. Pupils are equal, round, and reactive to light.  Neck: Normal range  of motion. Neck supple. No JVD present. No thyromegaly present.  Cardiovascular: Normal rate, normal heart sounds and intact distal pulses.  Exam reveals no gallop and no friction rub.   No murmur heard. Regular Irregular rhythym  Pulmonary/Chest: Effort  normal and breath sounds normal. No respiratory distress. He has no wheezes. He has no rales. He exhibits no tenderness.  Abdominal: Soft. Bowel sounds are normal. He exhibits no mass. There is no tenderness.  Genitourinary: Prostate normal and penis normal.  Musculoskeletal: Normal range of motion. He exhibits no edema.  Lymphadenopathy:    He has no cervical adenopathy.  Neurological: He is alert and oriented to person, place, and time. No cranial nerve deficit.  Skin: Skin is warm and dry.  Psychiatric: He has a normal mood and affect. His behavior is normal. Judgment and thought content normal.    BP 118/76 mmHg  Pulse 76  Temp(Src) 96.6 F (35.9 C) (Oral)  Ht 5' 10"  (1.778 m)  Wt 196 lb (88.905 kg)  BMI 28.12 kg/m2  Chest xray- mild cardiomegaly-Preliminary reading by Ronnald Collum, FNP  [facility]   Assessment & Plan:  1. HYPERTENSION, BENIGN Do not add salt to diet - DG Chest 2 View; Future - hydrochlorothiazide (HYDRODIURIL) 25 MG tablet; Take 1 tablet (25 mg total) by mouth daily.  Dispense: 90 tablet; Refill: 1 - quinapril (ACCUPRIL) 40 MG tablet; Take 1 tablet (40 mg total) by mouth daily. Take 1 1/2 tablets by mouth daily  Dispense: 135 tablet; Refill: 1 - CMP14+EGFR  2. Persistent atrial fibrillation (HCC) - apixaban (ELIQUIS) 2.5 MG TABS tablet; Take 1 tablet (2.5 mg total) by mouth 2 (two) times daily.  Dispense: 180 tablet; Refill: 1  3. Gastroesophageal reflux disease without esophagitis Avoid spicy foods Do not eat 2 hours prior to bedtime  4. Hyperlipidemia Low fat diet - simvastatin (ZOCOR) 20 MG tablet; Take 1 tablet (20 mg total) by mouth daily.  Dispense: 90 tablet; Refill: 1 - Lipid panel  5. Vitamin D deficiency Continue vitamin d oTC  6. BMI 27.0-27.9,adult Discussed diet and exercise for person with BMI >25 Will recheck weight in 3-6 months    Labs pending Health maintenance reviewed Diet and exercise encouraged Continue all meds Follow up  In  3 month   [place], FNP

## 2015-09-29 NOTE — Patient Instructions (Signed)
Fall Prevention in the Home  Falls can cause injuries and can affect people from all age groups. There are many simple things that you can do to make your home safe and to help prevent falls. WHAT CAN I DO ON THE OUTSIDE OF MY HOME?  Regularly repair the edges of walkways and driveways and fix any cracks.  Remove high doorway thresholds.  Trim any shrubbery on the main path into your home.  Use bright outdoor lighting.  Clear walkways of debris and clutter, including tools and rocks.  Regularly check that handrails are securely fastened and in good repair. Both sides of any steps should have handrails.  Install guardrails along the edges of any raised decks or porches.  Have leaves, snow, and ice cleared regularly.  Use sand or salt on walkways during winter months.  In the garage, clean up any spills right away, including grease or oil spills. WHAT CAN I DO IN THE BATHROOM?  Use night lights.  Install grab bars by the toilet and in the tub and shower. Do not use towel bars as grab bars.  Use non-skid mats or decals on the floor of the tub or shower.  If you need to sit down while you are in the shower, use a plastic, non-slip stool..  Keep the floor dry. Immediately clean up any water that spills on the floor.  Remove soap buildup in the tub or shower on a regular basis.  Attach bath mats securely with double-sided non-slip rug tape.  Remove throw rugs and other tripping hazards from the floor. WHAT CAN I DO IN THE BEDROOM?  Use night lights.  Make sure that a bedside light is easy to reach.  Do not use oversized bedding that drapes onto the floor.  Have a firm chair that has side arms to use for getting dressed.  Remove throw rugs and other tripping hazards from the floor. WHAT CAN I DO IN THE KITCHEN?   Clean up any spills right away.  Avoid walking on wet floors.  Place frequently used items in easy-to-reach places.  If you need to reach for something  above you, use a sturdy step stool that has a grab bar.  Keep electrical cables out of the way.  Do not use floor polish or wax that makes floors slippery. If you have to use wax, make sure that it is non-skid floor wax.  Remove throw rugs and other tripping hazards from the floor. WHAT CAN I DO IN THE STAIRWAYS?  Do not leave any items on the stairs.  Make sure that there are handrails on both sides of the stairs. Fix handrails that are broken or loose. Make sure that handrails are as long as the stairways.  Check any carpeting to make sure that it is firmly attached to the stairs. Fix any carpet that is loose or worn.  Avoid having throw rugs at the top or bottom of stairways, or secure the rugs with carpet tape to prevent them from moving.  Make sure that you have a light switch at the top of the stairs and the bottom of the stairs. If you do not have them, have them installed. WHAT ARE SOME OTHER FALL PREVENTION TIPS?  Wear closed-toe shoes that fit well and support your feet. Wear shoes that have rubber soles or low heels.  When you use a stepladder, make sure that it is completely opened and that the sides are firmly locked. Have someone hold the ladder while you   are using it. Do not climb a closed stepladder.  Add color or contrast paint or tape to grab bars and handrails in your home. Place contrasting color strips on the first and last steps.  Use mobility aids as needed, such as canes, walkers, scooters, and crutches.  Turn on lights if it is dark. Replace any light bulbs that burn out.  Set up furniture so that there are clear paths. Keep the furniture in the same spot.  Fix any uneven floor surfaces.  Choose a carpet design that does not hide the edge of steps of a stairway.  Be aware of any and all pets.  Review your medicines with your healthcare provider. Some medicines can cause dizziness or changes in blood pressure, which increase your risk of falling. Talk  with your health care provider about other ways that you can decrease your risk of falls. This may include working with a physical therapist or trainer to improve your strength, balance, and endurance.   This information is not intended to replace advice given to you by your health care provider. Make sure you discuss any questions you have with your health care provider.   Document Released: 05/12/2002 Document Revised: 10/06/2014 Document Reviewed: 06/26/2014 Elsevier Interactive Patient Education 2016 [employer]  

## 2015-10-19 ENCOUNTER — Other Ambulatory Visit: Payer: Self-pay | Admitting: *Deleted

## 2015-10-19 MED ORDER — RANITIDINE HCL 150 MG PO TABS: ORAL_TABLET | ORAL | Status: DC

## 2015-12-30 ENCOUNTER — Encounter: Payer: Self-pay | Admitting: Nurse Practitioner

## 2015-12-30 ENCOUNTER — Ambulatory Visit (INDEPENDENT_AMBULATORY_CARE_PROVIDER_SITE_OTHER): Payer: Medicare Other | Admitting: Nurse Practitioner

## 2015-12-30 VITALS — BP 126/84 | HR 93 | Temp 97.4°F | Ht 70.0 in | Wt 199.0 lb

## 2015-12-30 DIAGNOSIS — I481 Persistent atrial fibrillation: Secondary | ICD-10-CM | POA: Diagnosis not present

## 2015-12-30 DIAGNOSIS — K219 Gastro-esophageal reflux disease without esophagitis: Secondary | ICD-10-CM | POA: Diagnosis not present

## 2015-12-30 DIAGNOSIS — E559 Vitamin D deficiency, unspecified: Secondary | ICD-10-CM

## 2015-12-30 DIAGNOSIS — Z6827 Body mass index (BMI) 27.0-27.9, adult: Secondary | ICD-10-CM | POA: Diagnosis not present

## 2015-12-30 DIAGNOSIS — E785 Hyperlipidemia, unspecified: Secondary | ICD-10-CM | POA: Diagnosis not present

## 2015-12-30 DIAGNOSIS — I10 Essential (primary) hypertension: Secondary | ICD-10-CM | POA: Diagnosis not present

## 2015-12-30 DIAGNOSIS — I4819 Other persistent atrial fibrillation: Secondary | ICD-10-CM

## 2015-12-30 NOTE — Progress Notes (Signed)
Subjective:    Patient ID: Frank Cook, male    DOB: 1931/09/20, 82 y.o.   MRN: [id number]  HPI   Patient here today for follow up of chronic medical problems. Hesays that he is actually feeling well today.  Outpatient Encounter Prescriptions as of 12/30/2015  Medication Sig  . apixaban (ELIQUIS) 2.5 MG TABS tablet Take 1 tablet (2.5 mg total) by mouth 2 (two) times daily.  . cholecalciferol (VITAMIN D) 1000 UNITS tablet Take 1,000 Units by mouth daily.  . Glucosamine 500 MG CAPS Take 1,500 mg by mouth daily.  Marland Kitchen glucosamine-chondroitin 500-400 MG tablet Take 1 tablet by mouth 3 (three) times daily.  . hydrochlorothiazide (HYDRODIURIL) 25 MG tablet Take 1 tablet (25 mg total) by mouth daily.  . metoprolol succinate (TOPROL-XL) 100 MG 24 hr tablet Take 1 tablet (100 mg total) by mouth daily. Take with or immediately following a meal.  . Omega 3 1000 MG CAPS Take 2,000 mg by mouth daily.  . quinapril (ACCUPRIL) 40 MG tablet Take 1 tablet (40 mg total) by mouth daily. Take 1 1/2 tablets by mouth daily  . ranitidine (ZANTAC) 150 MG tablet Take 1 tablet (150 mg total) by mouth 2 (two) times daily.  . simvastatin (ZOCOR) 20 MG tablet Take 1 tablet (20 mg total) by mouth daily.   No facility-administered encounter medications on file as of 12/30/2015.     Hypertension This is a chronic problem. The current episode started more than 1 year ago. The problem is unchanged. The problem is controlled. Pertinent negatives include no blurred vision, chest pain, headaches, malaise/fatigue, neck pain, palpitations, peripheral edema or shortness of breath. There are no associated agents to hypertension. Risk factors for coronary artery disease include dyslipidemia and male gender. Past treatments include ACE inhibitors and diuretics. The current treatment provides significant improvement. There are no compliance problems.   Hyperlipidemia This is a chronic problem. The current episode started more than 1  year ago. The problem is controlled. Recent lipid tests were reviewed and are normal. There are no known factors aggravating his hyperlipidemia. Pertinent negatives include no chest pain, focal weakness, leg pain, myalgias or shortness of breath. Current antihyperlipidemic treatment includes statins. The current treatment provides significant improvement of lipids. There are no compliance problems.  Risk factors for coronary artery disease include hypertension and male sex.  Gastrophageal Reflux He reports no chest pain, no heartburn, no hoarse voice, no nausea or no sore throat. This is a chronic problem. The current episode started more than 1 year ago. The problem occurs rarely. The problem has been resolved. Nothing aggravates the symptoms. Pertinent negatives include no fatigue or melena. There are no known risk factors. He has tried an antacid for the symptoms. The treatment provided significant relief.  Atrial fib Currently on eliquis daily- no c/o palpitations. Saw Dr. Warren Lacy yesterday  Review of Systems  Constitutional: Negative.   HENT: Negative.   Respiratory: Negative.   Cardiovascular: Negative.   Gastrointestinal: Negative.   Genitourinary: Negative.   Neurological: Negative.   Psychiatric/Behavioral: Negative.   All other systems reviewed and are negative.      Objective:   Physical Exam  Constitutional: He is oriented to person, place, and time. He appears well-developed and well-nourished.  HENT:  Head: Normocephalic.  Right Ear: External ear normal.  Left Ear: External ear normal.  Nose: Nose normal.  Mouth/Throat: Oropharynx is clear and moist.  Eyes: EOM are normal. Pupils are equal, round, and reactive to light.  Neck: Normal range of motion. Neck supple. No JVD present. No thyromegaly present.  Cardiovascular: Normal rate, normal heart sounds and intact distal pulses.  Exam reveals no gallop and no friction rub.   No murmur heard. Regular Irregular rhythym    Pulmonary/Chest: Effort normal and breath sounds normal. No respiratory distress. He has no wheezes. He has no rales. He exhibits no tenderness.  Abdominal: Soft. Bowel sounds are normal. He exhibits no mass. There is no tenderness.  Genitourinary: Prostate normal and penis normal.  Musculoskeletal: Normal range of motion. He exhibits no edema.  Lymphadenopathy:    He has no cervical adenopathy.  Neurological: He is alert and oriented to person, place, and time. No cranial nerve deficit.  Skin: Skin is warm and dry.  Psychiatric: He has a normal mood and affect. His behavior is normal. Judgment and thought content normal.    BP 126/84   Pulse 93   Temp 97.4 F (36.3 C) (Oral)   Ht 5' 10"  (1.778 m)   Wt 199 lb (90.3 kg)   BMI 28.55 kg/m      Assessment & Plan:  1. HYPERTENSION, BENIGN Do not add salt to diet - CMP14+EGFR  2. Persistent atrial fibrillation (HCC) Continue eliquis  3. Gastroesophageal reflux disease without esophagitis Avoid spicy foods Do not eat 2 hours prior to bedtime  4. Hyperlipidemia Low fat diet - Lipid panel  5. Vitamin D deficiency   6. BMI 27.0-27.9,adult Discussed diet and exercise for person with BMI >25 Will recheck weight in 3-6 months    Labs pending Health maintenance reviewed Diet and exercise encouraged Continue all meds Follow up  In 3 months   [place], FNP

## 2015-12-31 LAB — CMP14+EGFR
ALBUMIN: 4.3 g/dL (ref 3.5–4.7)
ALK PHOS: 43 IU/L (ref 39–117)
ALT: 24 IU/L (ref 0–44)
AST: 30 IU/L (ref 0–40)
Albumin/Globulin Ratio: 1.4 (ref 1.2–2.2)
BILIRUBIN TOTAL: 0.7 mg/dL (ref 0.0–1.2)
BUN/Creatinine Ratio: 12 (ref 10–24)
BUN: 19 mg/dL (ref 8–27)
CO2: 24 mmol/L (ref 18–29)
CREATININE: 1.57 mg/dL — AB (ref 0.76–1.27)
Calcium: 9.5 mg/dL (ref 8.6–10.2)
Chloride: 101 mmol/L (ref 96–106)
GFR calc Af Amer: 47 mL/min/{1.73_m2} — ABNORMAL LOW (ref >59)
GFR calc non Af Amer: 40 mL/min/{1.73_m2} — ABNORMAL LOW (ref >59)
GLUCOSE: 96 mg/dL (ref 65–99)
Globulin, Total: 3 g/dL (ref 1.5–4.5)
Potassium: 4.1 mmol/L (ref 3.5–5.2)
Sodium: 142 mmol/L (ref 134–144)
TOTAL PROTEIN: 7.3 g/dL (ref 6.0–8.5)

## 2015-12-31 LAB — LIPID PANEL
CHOLESTEROL TOTAL: 125 mg/dL (ref 100–199)
Chol/HDL Ratio: 2.6 ratio units (ref 0.0–5.0)
HDL: 48 mg/dL (ref >39)
LDL CALC: 51 mg/dL (ref 0–99)
Triglycerides: 132 mg/dL (ref 0–149)
VLDL CHOLESTEROL CAL: 26 mg/dL (ref 5–40)

## 2016-04-03 ENCOUNTER — Ambulatory Visit (INDEPENDENT_AMBULATORY_CARE_PROVIDER_SITE_OTHER): Payer: Medicare Other | Admitting: Nurse Practitioner

## 2016-04-03 ENCOUNTER — Encounter: Payer: Self-pay | Admitting: Nurse Practitioner

## 2016-04-03 VITALS — BP 128/81 | HR 79 | Temp 96.8°F | Ht 70.0 in | Wt 196.0 lb

## 2016-04-03 DIAGNOSIS — E782 Mixed hyperlipidemia: Secondary | ICD-10-CM | POA: Diagnosis not present

## 2016-04-03 DIAGNOSIS — I4819 Other persistent atrial fibrillation: Secondary | ICD-10-CM

## 2016-04-03 DIAGNOSIS — K219 Gastro-esophageal reflux disease without esophagitis: Secondary | ICD-10-CM

## 2016-04-03 DIAGNOSIS — I481 Persistent atrial fibrillation: Secondary | ICD-10-CM

## 2016-04-03 DIAGNOSIS — Z6827 Body mass index (BMI) 27.0-27.9, adult: Secondary | ICD-10-CM | POA: Diagnosis not present

## 2016-04-03 DIAGNOSIS — Z23 Encounter for immunization: Secondary | ICD-10-CM

## 2016-04-03 DIAGNOSIS — I10 Essential (primary) hypertension: Secondary | ICD-10-CM

## 2016-04-03 DIAGNOSIS — E559 Vitamin D deficiency, unspecified: Secondary | ICD-10-CM | POA: Diagnosis not present

## 2016-04-03 MED ORDER — APIXABAN 2.5 MG PO TABS: ORAL_TABLET | ORAL | 1 refills | Status: DC

## 2016-04-03 MED ORDER — HYDROCHLOROTHIAZIDE 25 MG PO TABS: 25.0000 mg | ORAL_TABLET | Freq: Every day | ORAL | 1 refills | Status: DC

## 2016-04-03 MED ORDER — RANITIDINE HCL 150 MG PO TABS: ORAL_TABLET | ORAL | 0 refills | Status: DC

## 2016-04-03 MED ORDER — METOPROLOL SUCCINATE ER 100 MG PO TB24: 100.0000 mg | ORAL_TABLET | Freq: Every day | ORAL | 3 refills | Status: DC

## 2016-04-03 MED ORDER — SIMVASTATIN 20 MG PO TABS: 20.0000 mg | ORAL_TABLET | Freq: Every day | ORAL | 1 refills | Status: DC

## 2016-04-03 MED ORDER — QUINAPRIL HCL 40 MG PO TABS: 40.0000 mg | ORAL_TABLET | Freq: Every day | ORAL | 1 refills | Status: DC

## 2016-04-03 NOTE — Progress Notes (Signed)
Subjective:    Patient ID: Frank Cook, male    DOB: [birth year], 82 y.o.   MRN: [id number]  HPI   Patient here today for follow up of chronic medical problems. NO changes were made at last visit. NO complaints today,  Outpatient Encounter Prescriptions as of 04/03/2016  Medication Sig  . apixaban (ELIQUIS) 2.5 MG TABS tablet Take 1 tablet (2.5 mg total) by mouth 2 (two) times daily.  . cholecalciferol (VITAMIN D) 1000 UNITS tablet Take 1,000 Units by mouth daily.  . Glucosamine 500 MG CAPS Take 1,500 mg by mouth daily.  Marland Kitchen glucosamine-chondroitin 500-400 MG tablet Take 1 tablet by mouth 3 (three) times daily.  . hydrochlorothiazide (HYDRODIURIL) 25 MG tablet Take 1 tablet (25 mg total) by mouth daily.  . metoprolol succinate (TOPROL-XL) 100 MG 24 hr tablet Take 1 tablet (100 mg total) by mouth daily. Take with or immediately following a meal.  . Omega 3 1000 MG CAPS Take 2,000 mg by mouth daily.  . quinapril (ACCUPRIL) 40 MG tablet Take 1 tablet (40 mg total) by mouth daily. Take 1 1/2 tablets by mouth daily  . ranitidine (ZANTAC) 150 MG tablet Take 1 tablet (150 mg total) by mouth 2 (two) times daily.  . simvastatin (ZOCOR) 20 MG tablet Take 1 tablet (20 mg total) by mouth daily.   No facility-administered encounter medications on file as of 04/03/2016.     Hypertension This is a chronic problem. The current episode started more than 1 year ago. The problem is unchanged. The problem is controlled. Pertinent negatives include no blurred vision, chest pain, headaches, malaise/fatigue, neck pain, palpitations, peripheral edema or shortness of breath. There are no associated agents to hypertension. Risk factors for coronary artery disease include dyslipidemia and male gender. Past treatments include ACE inhibitors and diuretics. The current treatment provides significant improvement. There are no compliance problems.   Hyperlipidemia This is a chronic problem. The current episode started  more than 1 year ago. The problem is controlled. Recent lipid tests were reviewed and are normal. There are no known factors aggravating his hyperlipidemia. Pertinent negatives include no chest pain, focal weakness, leg pain, myalgias or shortness of breath. Current antihyperlipidemic treatment includes statins. The current treatment provides significant improvement of lipids. There are no compliance problems.  Risk factors for coronary artery disease include hypertension and male sex.  Gastrophageal Reflux He reports no chest pain, no heartburn, no hoarse voice, no nausea or no sore throat. This is a chronic problem. The current episode started more than 1 year ago. The problem occurs rarely. The problem has been resolved. Nothing aggravates the symptoms. Pertinent negatives include no fatigue or melena. There are no known risk factors. He has tried an antacid for the symptoms. The treatment provided significant relief.  Atrial fib Currently on eliquis daily- no c/o palpitations. Saw Dr. Warren Lacy in January- sees him yearly. No side effects form eliquis.  Review of Systems  Constitutional: Negative.   HENT: Negative.   Respiratory: Negative.   Cardiovascular: Negative.   Gastrointestinal: Negative.   Genitourinary: Negative.   Neurological: Negative.   Psychiatric/Behavioral: Negative.   All other systems reviewed and are negative.      Objective:   Physical Exam  Constitutional: He is oriented to person, place, and time. He appears well-developed and well-nourished.  HENT:  Head: Normocephalic.  Right Ear: External ear normal.  Left Ear: External ear normal.  Nose: Nose normal.  Mouth/Throat: Oropharynx is clear and moist.  Eyes:  EOM are normal. Pupils are equal, round, and reactive to light.  Neck: Normal range of motion. Neck supple. No JVD present. No thyromegaly present.  Cardiovascular: Normal rate, normal heart sounds and intact distal pulses.  Exam reveals no gallop and no  friction rub.   No murmur heard. Regular Irregular rhythym  Pulmonary/Chest: Effort normal and breath sounds normal. No respiratory distress. He has no wheezes. He has no rales. He exhibits no tenderness.  Abdominal: Soft. Bowel sounds are normal. He exhibits no mass. There is no tenderness.  Genitourinary: Prostate normal and penis normal.  Musculoskeletal: Normal range of motion. He exhibits no edema.  Lymphadenopathy:    He has no cervical adenopathy.  Neurological: He is alert and oriented to person, place, and time. No cranial nerve deficit.  Skin: Skin is warm and dry.  Psychiatric: He has a normal mood and affect. His behavior is normal. Judgment and thought content normal.   BP 128/81   Pulse 79   Temp (!) 96.8 F (36 C) (Oral)   Ht 5' 10"  (1.778 m)   Wt 196 lb (88.9 kg)   BMI 28.12 kg/m     Assessment & Plan:  1. Persistent atrial fibrillation ([place]) Keep follow up with cardiology - apixaban (ELIQUIS) 2.5 MG TABS tablet; Take 1 tablet (2.5 mg total) by mouth 2 (two) times daily.  Dispense: 180 tablet; Refill: 1  2. HYPERTENSION, BENIGN Low sodium diet - metoprolol succinate (TOPROL-XL) 100 MG 24 hr tablet; Take 1 tablet (100 mg total) by mouth daily. Take with or immediately following a meal.  Dispense: 90 tablet; Refill: 3 - quinapril (ACCUPRIL) 40 MG tablet; Take 1 tablet (40 mg total) by mouth daily. Take 1 1/2 tablets by mouth daily  Dispense: 135 tablet; Refill: 1 - hydrochlorothiazide (HYDRODIURIL) 25 MG tablet; Take 1 tablet (25 mg total) by mouth daily.  Dispense: 90 tablet; Refill: 1 - CMP14+EGFR  3. Gastroesophageal reflux disease without esophagitis Avoid spicy foods Do not eat 2 hours prior to bedtime - ranitidine (ZANTAC) 150 MG tablet; Take 1 tablet (150 mg total) by mouth 2 (two) times daily.  Dispense: 180 tablet; Refill: 0  4. BMI 27.0-27.9,adult Discussed diet and exercise for person with BMI >25 Will recheck weight in 3-6 months  5. Mixed  hyperlipidemia Low fat diet - simvastatin (ZOCOR) 20 MG tablet; Take 1 tablet (20 mg total) by mouth daily.  Dispense: 90 tablet; Refill: 1 - Lipid panel  6. Vitamin D deficiency     Labs pending Health maintenance reviewed Diet and exercise encouraged Continue all meds Follow up  In 3 month   [place], FNP

## 2016-04-03 NOTE — Patient Instructions (Signed)
Fall Prevention in the Home  Falls can cause injuries and can affect people from all age groups. There are many simple things that you can do to make your home safe and to help prevent falls. WHAT CAN I DO ON THE OUTSIDE OF MY HOME?  Regularly repair the edges of walkways and driveways and fix any cracks.  Remove high doorway thresholds.  Trim any shrubbery on the main path into your home.  Use bright outdoor lighting.  Clear walkways of debris and clutter, including tools and rocks.  Regularly check that handrails are securely fastened and in good repair. Both sides of any steps should have handrails.  Install guardrails along the edges of any raised decks or porches.  Have leaves, snow, and ice cleared regularly.  Use sand or salt on walkways during winter months.  In the garage, clean up any spills right away, including grease or oil spills. WHAT CAN I DO IN THE BATHROOM?  Use night lights.  Install grab bars by the toilet and in the tub and shower. Do not use towel bars as grab bars.  Use non-skid mats or decals on the floor of the tub or shower.  If you need to sit down while you are in the shower, use a plastic, non-slip stool..  Keep the floor dry. Immediately clean up any water that spills on the floor.  Remove soap buildup in the tub or shower on a regular basis.  Attach bath mats securely with double-sided non-slip rug tape.  Remove throw rugs and other tripping hazards from the floor. WHAT CAN I DO IN THE BEDROOM?  Use night lights.  Make sure that a bedside light is easy to reach.  Do not use oversized bedding that drapes onto the floor.  Have a firm chair that has side arms to use for getting dressed.  Remove throw rugs and other tripping hazards from the floor. WHAT CAN I DO IN THE KITCHEN?   Clean up any spills right away.  Avoid walking on wet floors.  Place frequently used items in easy-to-reach places.  If you need to reach for something  above you, use a sturdy step stool that has a grab bar.  Keep electrical cables out of the way.  Do not use floor polish or wax that makes floors slippery. If you have to use wax, make sure that it is non-skid floor wax.  Remove throw rugs and other tripping hazards from the floor. WHAT CAN I DO IN THE STAIRWAYS?  Do not leave any items on the stairs.  Make sure that there are handrails on both sides of the stairs. Fix handrails that are broken or loose. Make sure that handrails are as long as the stairways.  Check any carpeting to make sure that it is firmly attached to the stairs. Fix any carpet that is loose or worn.  Avoid having throw rugs at the top or bottom of stairways, or secure the rugs with carpet tape to prevent them from moving.  Make sure that you have a light switch at the top of the stairs and the bottom of the stairs. If you do not have them, have them installed. WHAT ARE SOME OTHER FALL PREVENTION TIPS?  Wear closed-toe shoes that fit well and support your feet. Wear shoes that have rubber soles or low heels.  When you use a stepladder, make sure that it is completely opened and that the sides are firmly locked. Have someone hold the ladder while you   are using it. Do not climb a closed stepladder.  Add color or contrast paint or tape to grab bars and handrails in your home. Place contrasting color strips on the first and last steps.  Use mobility aids as needed, such as canes, walkers, scooters, and crutches.  Turn on lights if it is dark. Replace any light bulbs that burn out.  Set up furniture so that there are clear paths. Keep the furniture in the same spot.  Fix any uneven floor surfaces.  Choose a carpet design that does not hide the edge of steps of a stairway.  Be aware of any and all pets.  Review your medicines with your healthcare provider. Some medicines can cause dizziness or changes in blood pressure, which increase your risk of falling. Talk  with your health care provider about other ways that you can decrease your risk of falls. This may include working with a physical therapist or trainer to improve your strength, balance, and endurance.   This information is not intended to replace advice given to you by your health care provider. Make sure you discuss any questions you have with your health care provider.   Document Released: 05/12/2002 Document Revised: 10/06/2014 Document Reviewed: 06/26/2014 Elsevier Interactive Patient Education 2016 [employer]  

## 2016-04-04 LAB — CMP14+EGFR
A/G RATIO: 1.6 (ref 1.2–2.2)
ALBUMIN: 4.5 g/dL (ref 3.5–4.7)
ALT: 24 IU/L (ref 0–44)
AST: 33 IU/L (ref 0–40)
Alkaline Phosphatase: 46 IU/L (ref 39–117)
BILIRUBIN TOTAL: 0.7 mg/dL (ref 0.0–1.2)
BUN / CREAT RATIO: 12 (ref 10–24)
BUN: 20 mg/dL (ref 8–27)
CHLORIDE: 101 mmol/L (ref 96–106)
CO2: 25 mmol/L (ref 18–29)
Calcium: 10.2 mg/dL (ref 8.6–10.2)
Creatinine, Ser: 1.67 mg/dL — ABNORMAL HIGH (ref 0.76–1.27)
GFR calc non Af Amer: 38 mL/min/{1.73_m2} — ABNORMAL LOW (ref >59)
GFR, EST AFRICAN AMERICAN: 43 mL/min/{1.73_m2} — AB (ref >59)
GLOBULIN, TOTAL: 2.9 g/dL (ref 1.5–4.5)
Glucose: 92 mg/dL (ref 65–99)
POTASSIUM: 4.1 mmol/L (ref 3.5–5.2)
Sodium: 142 mmol/L (ref 134–144)
TOTAL PROTEIN: 7.4 g/dL (ref 6.0–8.5)

## 2016-04-04 LAB — LIPID PANEL
CHOL/HDL RATIO: 2.5 ratio (ref 0.0–5.0)
Cholesterol, Total: 125 mg/dL (ref 100–199)
HDL: 50 mg/dL (ref >39)
LDL Calculated: 50 mg/dL (ref 0–99)
Triglycerides: 126 mg/dL (ref 0–149)
VLDL CHOLESTEROL CAL: 25 mg/dL (ref 5–40)

## 2016-07-11 ENCOUNTER — Ambulatory Visit (INDEPENDENT_AMBULATORY_CARE_PROVIDER_SITE_OTHER): Payer: Medicare Other | Admitting: Nurse Practitioner

## 2016-07-11 ENCOUNTER — Encounter: Payer: Self-pay | Admitting: Nurse Practitioner

## 2016-07-11 VITALS — BP 137/88 | HR 69 | Temp 96.7°F | Ht 70.0 in | Wt 197.2 lb

## 2016-07-11 DIAGNOSIS — K219 Gastro-esophageal reflux disease without esophagitis: Secondary | ICD-10-CM | POA: Diagnosis not present

## 2016-07-11 DIAGNOSIS — E559 Vitamin D deficiency, unspecified: Secondary | ICD-10-CM | POA: Diagnosis not present

## 2016-07-11 DIAGNOSIS — I481 Persistent atrial fibrillation: Secondary | ICD-10-CM

## 2016-07-11 DIAGNOSIS — I10 Essential (primary) hypertension: Secondary | ICD-10-CM

## 2016-07-11 DIAGNOSIS — E782 Mixed hyperlipidemia: Secondary | ICD-10-CM

## 2016-07-11 DIAGNOSIS — Z6827 Body mass index (BMI) 27.0-27.9, adult: Secondary | ICD-10-CM

## 2016-07-11 DIAGNOSIS — I4819 Other persistent atrial fibrillation: Secondary | ICD-10-CM

## 2016-07-11 LAB — CMP14+EGFR
A/G RATIO: 1.6 (ref 1.2–2.2)
ALT: 22 IU/L (ref 0–44)
AST: 29 IU/L (ref 0–40)
Albumin: 4.5 g/dL (ref 3.5–4.7)
Alkaline Phosphatase: 47 IU/L (ref 39–117)
BILIRUBIN TOTAL: 0.7 mg/dL (ref 0.0–1.2)
BUN/Creatinine Ratio: 13 (ref 10–24)
BUN: 21 mg/dL (ref 8–27)
CALCIUM: 10 mg/dL (ref 8.6–10.2)
CHLORIDE: 98 mmol/L (ref 96–106)
CO2: 24 mmol/L (ref 18–29)
Creatinine, Ser: 1.57 mg/dL — ABNORMAL HIGH (ref 0.76–1.27)
GFR, EST AFRICAN AMERICAN: 47 mL/min/{1.73_m2} — AB (ref >59)
GFR, EST NON AFRICAN AMERICAN: 40 mL/min/{1.73_m2} — AB (ref >59)
GLUCOSE: 89 mg/dL (ref 65–99)
Globulin, Total: 2.9 g/dL (ref 1.5–4.5)
POTASSIUM: 4.2 mmol/L (ref 3.5–5.2)
Sodium: 141 mmol/L (ref 134–144)
TOTAL PROTEIN: 7.4 g/dL (ref 6.0–8.5)

## 2016-07-11 LAB — LIPID PANEL
Chol/HDL Ratio: 2.7 ratio units (ref 0.0–5.0)
Cholesterol, Total: 130 mg/dL (ref 100–199)
HDL: 48 mg/dL (ref >39)
LDL Calculated: 53 mg/dL (ref 0–99)
TRIGLYCERIDES: 146 mg/dL (ref 0–149)
VLDL CHOLESTEROL CAL: 29 mg/dL (ref 5–40)

## 2016-07-11 MED ORDER — SIMVASTATIN 20 MG PO TABS: 20.0000 mg | ORAL_TABLET | Freq: Every day | ORAL | 1 refills | Status: DC

## 2016-07-11 MED ORDER — QUINAPRIL HCL 40 MG PO TABS: 40.0000 mg | ORAL_TABLET | Freq: Every day | ORAL | 1 refills | Status: DC

## 2016-07-11 MED ORDER — APIXABAN 2.5 MG PO TABS: ORAL_TABLET | ORAL | 1 refills | Status: DC

## 2016-07-11 MED ORDER — RANITIDINE HCL 150 MG PO TABS: ORAL_TABLET | ORAL | 0 refills | Status: DC

## 2016-07-11 MED ORDER — METOPROLOL SUCCINATE ER 100 MG PO TB24: 100.0000 mg | ORAL_TABLET | Freq: Every day | ORAL | 3 refills | Status: DC

## 2016-07-11 MED ORDER — HYDROCHLOROTHIAZIDE 25 MG PO TABS: 25.0000 mg | ORAL_TABLET | Freq: Every day | ORAL | 1 refills | Status: DC

## 2016-07-11 NOTE — Progress Notes (Signed)
Subjective:    Patient ID: Frank Cook, male    DOB: March 02, 1932, 83 y.o.   MRN: [id number]  HPI   Patient here today for follow up of chronic medical problems. Hesays that he is actually feeling well today.  Outpatient Encounter Prescriptions as of 07/11/2016  Medication Sig  . apixaban (ELIQUIS) 2.5 MG TABS tablet Take 1 tablet (2.5 mg total) by mouth 2 (two) times daily.  . cholecalciferol (VITAMIN D) 1000 UNITS tablet Take 1,000 Units by mouth daily.  . Glucosamine 500 MG CAPS Take 1,500 mg by mouth daily.  Marland Kitchen glucosamine-chondroitin 500-400 MG tablet Take 1 tablet by mouth 3 (three) times daily.  . hydrochlorothiazide (HYDRODIURIL) 25 MG tablet Take 1 tablet (25 mg total) by mouth daily.  . metoprolol succinate (TOPROL-XL) 100 MG 24 hr tablet Take 1 tablet (100 mg total) by mouth daily. Take with or immediately following a meal.  . Omega 3 1000 MG CAPS Take 2,000 mg by mouth daily.  . quinapril (ACCUPRIL) 40 MG tablet Take 1 tablet (40 mg total) by mouth daily. Take 1 1/2 tablets by mouth daily  . ranitidine (ZANTAC) 150 MG tablet Take 1 tablet (150 mg total) by mouth 2 (two) times daily.  . simvastatin (ZOCOR) 20 MG tablet Take 1 tablet (20 mg total) by mouth daily.   No facility-administered encounter medications on file as of 07/11/2016.     Hypertension This is a chronic problem. The current episode started more than 1 year ago. The problem is unchanged. The problem is controlled. Pertinent negatives include no blurred vision, chest pain, headaches, malaise/fatigue, neck pain, palpitations, peripheral edema or shortness of breath. There are no associated agents to hypertension. Risk factors for coronary artery disease include dyslipidemia and male gender. Past treatments include ACE inhibitors and diuretics. The current treatment provides significant improvement. There are no compliance problems.   Hyperlipidemia This is a chronic problem. The current episode started more than 1  year ago. The problem is controlled. Recent lipid tests were reviewed and are normal. There are no known factors aggravating his hyperlipidemia. Pertinent negatives include no chest pain, focal weakness, leg pain, myalgias or shortness of breath. Current antihyperlipidemic treatment includes statins. The current treatment provides significant improvement of lipids. There are no compliance problems.  Risk factors for coronary artery disease include hypertension and male sex.  Gastrophageal Reflux He reports no chest pain, no heartburn, no hoarse voice, no nausea or no sore throat. This is a chronic problem. The current episode started more than 1 year ago. The problem occurs rarely. The problem has been resolved. Nothing aggravates the symptoms. Pertinent negatives include no fatigue or melena. There are no known risk factors. He has tried an antacid for the symptoms. The treatment provided significant relief.  Atrial fib Currently on eliquis daily- no c/o palpitations. Saw Dr. Warren Lacy yesterday  Review of Systems  Constitutional: Negative.   HENT: Negative.   Respiratory: Negative.   Cardiovascular: Negative.   Gastrointestinal: Negative.   Genitourinary: Negative.   Neurological: Negative.   Psychiatric/Behavioral: Negative.   All other systems reviewed and are negative.      Objective:   Physical Exam  Constitutional: He is oriented to person, place, and time. He appears well-developed and well-nourished.  HENT:  Head: Normocephalic.  Right Ear: External ear normal.  Left Ear: External ear normal.  Nose: Nose normal.  Mouth/Throat: Oropharynx is clear and moist.  Eyes: EOM are normal. Pupils are equal, round, and reactive to light.  Neck: Normal range of motion. Neck supple. No JVD present. No thyromegaly present.  Cardiovascular: Normal rate, normal heart sounds and intact distal pulses.  Exam reveals no gallop and no friction rub.   No murmur heard. Regular Irregular rhythym    Pulmonary/Chest: Effort normal and breath sounds normal. No respiratory distress. He has no wheezes. He has no rales. He exhibits no tenderness.  Abdominal: Soft. Bowel sounds are normal. He exhibits no mass. There is no tenderness.  Genitourinary: Prostate normal and penis normal.  Musculoskeletal: Normal range of motion. He exhibits no edema.  Lymphadenopathy:    He has no cervical adenopathy.  Neurological: He is alert and oriented to person, place, and time. No cranial nerve deficit.  Skin: Skin is warm and dry.  Psychiatric: He has a normal mood and affect. His behavior is normal. Judgment and thought content normal.    BP 137/88   Pulse 69   Temp (!) 96.7 F (35.9 C) (Oral)   Ht _0  (1.778 m)   Wt 197 lb 3.2 oz (89.4 kg)   BMI 28.30 kg/m   Assessment & Plan:  1. Persistent atrial fibrillation (HCC) - apixaban (ELIQUIS) 2.5 MG TABS tablet; Take 1 tablet (2.5 mg total) by mouth 2 (two) times daily.  Dispense: 180 tablet; Refill: 1  2. HYPERTENSION, BENIGN Low sodium diet - quinapril (ACCUPRIL) 40 MG tablet; Take 1 tablet (40 mg total) by mouth daily. Take 1 1/2 tablets by mouth daily  Dispense: 135 tablet; Refill: 1 - hydrochlorothiazide (HYDRODIURIL) 25 MG tablet; Take 1 tablet (25 mg total) by mouth daily.  Dispense: 90 tablet; Refill: 1 - metoprolol succinate (TOPROL-XL) 100 MG 24 hr tablet; Take 1 tablet (100 mg total) by mouth daily. Take with or immediately following a meal.  Dispense: 90 tablet; Refill: 3 - CMP14+EGFR  3. Gastroesophageal reflux disease without esophagitis Avoid spicy foods Do not eat 2 hours prior to bedtime - ranitidine (ZANTAC) 150 MG tablet; Take 1 tablet (150 mg total) by mouth 2 (two) times daily.  Dispense: 180 tablet; Refill: 0  4. BMI 27.0-27.9,adult Discussed diet and exercise for person with BMI >25 Will recheck weight in 3-6 months  5. Mixed hyperlipidemia Low fat diet - simvastatin (ZOCOR) 20 MG tablet; Take 1 tablet (20 mg  total) by mouth daily.  Dispense: 90 tablet; Refill: 1 - Lipid panel  6. Vitamin D deficiency    Labs pending Health maintenance reviewed Diet and exercise encouraged Continue all meds Follow up  In 3 months   [place], FNP

## 2016-07-11 NOTE — Patient Instructions (Signed)
Atrial Fibrillation °Introduction °Atrial fibrillation is a type of heartbeat that is irregular or fast (rapid). If you have this condition, your heart keeps quivering in a weird (chaotic) way. This condition can make it so your heart cannot pump blood normally. Having this condition gives a person more risk for stroke, heart failure, and other heart problems. There are different types of atrial fibrillation. Talk with your doctor to learn about the type that you have. °Follow these instructions at home: °· Take over-the-counter and prescription medicines only as told by your doctor. °· If your doctor prescribed a blood-thinning medicine, take it exactly as told. Taking too much of it can cause bleeding. If you do not take enough of it, you will not have the protection that you need against stroke and other problems. °· Do not use any tobacco products. These include cigarettes, chewing tobacco, and e-cigarettes. If you need help quitting, ask your doctor. °· If you have apnea (obstructive sleep apnea), manage it as told by your doctor. °· Do not drink alcohol. °· Do not drink beverages that have caffeine. These include coffee, soda, and tea. °· Maintain a healthy weight. Do not use diet pills unless your doctor says they are safe for you. Diet pills may make heart problems worse. °· Follow diet instructions as told by your doctor. °· Exercise regularly as told by your doctor. °· Keep all follow-up visits as told by your doctor. This is important. °Contact a doctor if: °· You notice a change in the speed, rhythm, or strength of your heartbeat. °· You are taking a blood-thinning medicine and you notice more bruising. °· You get tired more easily when you move or exercise. °Get help right away if: °· You have pain in your chest or your belly (abdomen). °· You have sweating or weakness. °· You feel sick to your stomach (nauseous). °· You notice blood in your throw up (vomit), poop (stool), or pee (urine). °· You are  short of breath. °· You suddenly have swollen feet and ankles. °· You feel dizzy. °· Your suddenly get weak or numb in your face, arms, or legs, especially if it happens on one side of your body. °· You have trouble talking, trouble understanding, or both. °· Your face or your eyelid droops on one side. °These symptoms may be an emergency. Do not wait to see if the symptoms will go away. Get medical help right away. Call your local emergency services (911 in the [place] Do not drive yourself to the hospital.  °This information is not intended to replace advice given to you by your health care provider. Make sure you discuss any questions you have with your health care provider. °Document Released: 02/29/2008 Document Revised: 10/28/2015 Document Reviewed: 09/16/2014 °© 2017 Elsevier ° °

## 2016-07-19 ENCOUNTER — Ambulatory Visit (INDEPENDENT_AMBULATORY_CARE_PROVIDER_SITE_OTHER): Payer: Medicare Other | Admitting: Pharmacist

## 2016-07-19 ENCOUNTER — Encounter: Payer: Self-pay | Admitting: Pharmacist

## 2016-07-19 VITALS — BP 130/74 | HR 68 | Ht 70.0 in | Wt 198.0 lb

## 2016-07-19 DIAGNOSIS — Z0000 Encounter for general adult medical examination without abnormal findings: Secondary | ICD-10-CM

## 2016-07-19 NOTE — Progress Notes (Signed)
Patient ID: Frank Cook, male   DOB: June 25, 1931, 83 y.o.   MRN: [id number]    Subjective:   Frank Cook is a 83 y.o. male who presents for an Initial Medicare Annual Wellness Visit.  Social History: Occupational history: retired Medical laboratory scientific officer for [employer] Marital history: married - is primary care giver for wife No children Alcohol/Tobacco/Substances: no   Review of Systems  Review of Systems  Constitutional: Negative.   HENT: Negative.   Eyes: Negative.   Respiratory: Negative.   Cardiovascular: Negative.   Gastrointestinal: Negative.   Genitourinary: Negative.   Musculoskeletal: Negative.   Skin: Negative.   Neurological: Negative.   Endo/Heme/Allergies: Negative.   Psychiatric/Behavioral: Negative.       Current Medications (verified) Outpatient Encounter Prescriptions as of 07/19/2016  Medication Sig  . apixaban (ELIQUIS) 2.5 MG TABS tablet Take 1 tablet (2.5 mg total) by mouth 2 (two) times daily.  . cholecalciferol (VITAMIN D) 1000 UNITS tablet Take 1,000 Units by mouth daily.  . Glucosamine 500 MG CAPS Take 1,500 mg by mouth daily.  Marland Kitchen glucosamine-chondroitin 500-400 MG tablet Take 1 tablet by mouth 3 (three) times daily.  . hydrochlorothiazide (HYDRODIURIL) 25 MG tablet Take 1 tablet (25 mg total) by mouth daily.  . metoprolol succinate (TOPROL-XL) 100 MG 24 hr tablet Take 1 tablet (100 mg total) by mouth daily. Take with or immediately following a meal.  . Omega 3 1000 MG CAPS Take 2,000 mg by mouth daily.  . quinapril (ACCUPRIL) 40 MG tablet Take 1 tablet (40 mg total) by mouth daily. Take 1 1/2 tablets by mouth daily  . ranitidine (ZANTAC) 150 MG tablet Take 1 tablet (150 mg total) by mouth 2 (two) times daily.  . simvastatin (ZOCOR) 20 MG tablet Take 1 tablet (20 mg total) by mouth daily.   No facility-administered encounter medications on file as of 07/19/2016.     Allergies (verified) Patient has no known allergies.   History: Past Medical History:    Diagnosis Date  . Colon polyps   . GERD (gastroesophageal reflux disease)   . Hyperlipidemia   . Hypertension   . Osteopenia    Past Surgical History:  Procedure Laterality Date  . None     Family History  Problem Relation Age of Onset  . COPD Mother   . Cancer Father     Bone  . Cancer Brother     Bone   Social History   Occupational History  . Not on file.   Social History Main Topics  . Smoking status: Never Smoker  . Smokeless tobacco: Never Used  . Alcohol use No  . Drug use: No  . Sexual activity: No    Do you feel safe at home?  Yes Are there smokers in your home (other than you)? No  Dietary issues and exercise activities discussed: Current Exercise Habits: The patient does not participate in regular exercise at present  Current Dietary habits:  Patient cooks meals for himself and wife. He also does grocery shopping  Tries to limit fat intake    Cardiac Risk Factors include: advanced age (>61men, >69 women);male gender;obesity (BMI >30kg/m2);sedentary lifestyle;hypertension  Objective:    Today's Vitals   07/19/16 1532  BP: 130/74  Pulse: 68  Weight: 198 lb (89.8 kg)  Height: 5\' 10"  (1.778 m)  PainSc: 0-No pain   Body mass index is 28.41 kg/m.   Activities of Daily Living In your present state of health, do you have any difficulty performing  the following activities: 07/19/2016  Hearing? N  Vision? N  Difficulty concentrating or making decisions? N  Walking or climbing stairs? N  Dressing or bathing? N  Preparing Food and eating ? N  Using the Toilet? N  In the past six months, have you accidently leaked urine? N  Do you have problems with loss of bowel control? N  Managing your Medications? N  Managing your Finances? N  Housekeeping or managing your Housekeeping? Y  Some recent data might be hidden     Depression Screen PHQ 2/9 Scores 07/19/2016 07/11/2016 04/03/2016 12/30/2015  PHQ - 2 Score 0 0 0 0     Fall Risk Fall Risk   07/19/2016 07/11/2016 04/03/2016 12/30/2015 09/29/2015  Falls in the past year? No No No No No    Cognitive Function: MMSE - Mini Mental State Exam 07/19/2016  Orientation to time 5  Orientation to Place 5  Registration 3  Attention/ Calculation 4  Recall 1  Language- name 2 objects 2  Language- repeat 1  Language- follow 3 step command 3  Language- read & follow direction 1  Write a sentence 1  Copy design 1  Total score 27    Immunizations and Health Maintenance Immunization History  Administered Date(s) Administered  . Influenza, High Dose Seasonal PF 04/03/2016  . Influenza,inj,Quad PF,36+ Mos 02/24/2013, 03/16/2014, 03/29/2015  . Pneumococcal Conjugate-13 09/17/2014  . Pneumococcal Polysaccharide-23 06/06/1999  . Tdap 03/06/2011  . Zoster 04/06/2007   There are no preventive care reminders to display for this patient.  Patient Care Team: Bennie PieriniMary-Margaret Martin, FNP as PCP - General (Nurse Practitioner) Rollene RotundaJames Hochrein, MD as Consulting Physician (Cardiology)  Indicate any recent Medical Services you may have received from other than [facility] providers in the past year (date may be approximate).    Assessment:    Annual Wellness Visit    Screening Tests Health Maintenance  Topic Date Due  . TETANUS/TDAP  03/14/2021  . INFLUENZA VACCINE  Completed  . ZOSTAVAX  Completed  . PNA vac Low Risk Adult  Completed        Plan:   During the course of the visit Kaidyn was educated and counseled about the following appropriate screening and preventive services:   Vaccines to include Pneumoccal, Influenza,  Td, Zostavax - all vaccines are UTD  Colorectal cancer screening - colonoscopy is UTD  Cardiovascular disease screening - UTD - due to follow up with cardiologist - patient to call for appt  Diabetes screening - UTD and was WNL  Glaucoma screening /  Eye Exam- UTD  Nutrition counseling - continue with current diet - recommended smaller portions (goal is for BMI of  25)  Advanced Directives - UTD and copy on file  Physical Activity - handout for chair exercises given and reviewed - recommended he try to do daily.  Patient is also advised to contact office if he enters the Medicare coverage gap later in the year as we might be able to assist with getting Eliquis from manufacturer.     Patient Instructions (the written plan) were given to the patient.   Henrene Pastorammy Bonner Larue, PharmD   07/19/2016

## 2016-07-19 NOTE — Patient Instructions (Addendum)
  Mr. Frank Cook , Thank you for taking time to come for your Medicare Wellness Visit. I appreciate your ongoing commitment to your health goals. Please review the following plan we discussed and let me know if I can assist you in the future.    Try to stay active - do chair exercises daily  Make follow up visit with Dr Kirtland BouchardHocherin [phone]  This is a list of the screening recommended for you and due dates:  Health Maintenance  Topic Date Due  . Tetanus Vaccine  03/14/2021  . Flu Shot  Completed  . Shingles Vaccine  Completed  . Pneumonia vaccines  Completed

## 2016-09-05 ENCOUNTER — Encounter: Payer: Self-pay | Admitting: Cardiology

## 2016-09-17 NOTE — Progress Notes (Signed)
HPI The patient presented for evaluation of an abnormal EKG. The computer reading of this in October of 2016 suggested atrial fibrillation.  However, this EKG was actually sinus rhythm with PACs.  A more recent EKG in July of last year demonstrated atrial fibrillation.  Holter has demonstrated rate control.  He has been treated with Eliquis.  He does not feel the atrial fib at all.  The patient denies any new symptoms such as chest discomfort, neck or arm discomfort. There has been no new shortness of breath, PND or orthopnea. There have been no reported palpitations, presyncope or syncope.    No Known Allergies  Current Outpatient Prescriptions  Medication Sig Dispense Refill  . apixaban (ELIQUIS) 2.5 MG TABS tablet Take 1 tablet (2.5 mg total) by mouth 2 (two) times daily. 180 tablet 1  . Cholecalciferol (VITAMIN D3) 5000 units CAPS Take 5,000 Units by mouth daily.    . Glucosamine 500 MG CAPS Take 1,500 mg by mouth daily.    . hydrochlorothiazide (HYDRODIURIL) 25 MG tablet Take 1 tablet (25 mg total) by mouth daily. 90 tablet 1  . metoprolol succinate (TOPROL-XL) 100 MG 24 hr tablet Take 1 tablet (100 mg total) by mouth daily. Take with or immediately following a meal. 90 tablet 3  . Omega 3 1000 MG CAPS Take 2,000 mg by mouth daily.    . quinapril (ACCUPRIL) 40 MG tablet 40 mg. Take 1 1/2 tablets by mouth daily    . ranitidine (ZANTAC) 150 MG tablet Take 1 tablet (150 mg total) by mouth 2 (two) times daily. 180 tablet 0  . simvastatin (ZOCOR) 20 MG tablet Take 1 tablet (20 mg total) by mouth daily. 90 tablet 1   No current facility-administered medications for this visit.     Past Medical History:  Diagnosis Date  . Colon polyps   . GERD (gastroesophageal reflux disease)   . Hyperlipidemia   . Hypertension   . Osteopenia     Past Surgical History:  Procedure Laterality Date  . None       ROS:  Mild calf pain with and without activity but it is stable and does not bother  him.  He has no pain at night  As stated in the HPI and negative for all other systems.  PHYSICAL EXAM BP 104/72   Pulse 78   Ht  (1.778 m)   Wt 195 lb (88.5 kg)   BMI 27.98 kg/m   GENERAL:  Well appearing NECK:  No jugular venous distention, waveform within normal limits, carotid upstroke brisk and symmetric, no bruits, no thyromegaly LUNGS:  Clear to auscultation bilaterally BACK:  No CVA tenderness CHEST:  Unremarkable HEART:  PMI not displaced or sustained,S1 and S2 within normal limits, no S3, no clicks, no rubs, no murmurs, irregular  ABD:  Flat, positive bowel sounds normal in frequency in pitch, no bruits, no rebound, no guarding, no midline pulsatile mass, no hepatomegaly, no splenomegaly EXT:  2 plus pulses upper and decreased DP/PT bilateral, no edema, no cyanosis no clubbing SKIN:  No rashes no nodules  Lab Results  Component Value Date   CREATININE 1.57 (H) 07/11/2016     EKG:  Atrial fib  rate 78, axis within normal limits, RBBB, no acute ST-T wave changes.  Low voltage limb leads borderline.  RBBB new since previous. 09/20/2016   ASSESSMENT AND PLAN  ATRIAL FIB:    [name] has a CHA2DS2 - VASc score of 3 with  a risk of stroke of 3.2%.  The patient  tolerates this rhythm and rate control and anticoagulation. We will continue with the meds as listed.  HTN:   Blood pressure is at target.  He will continue with current meds.    CKD:  His creatinine last month was 1.57  which was stable. I reviewed the labs.  He can continue the meds as listed.  PVD:  I suspect some PVD.  However, I inspected his feet and he has no ulcers.  He really does not have significant claudication.  I will follow this with continued risk reduction.  Of note his lipid are followed very closely and well controlled.

## 2016-09-20 ENCOUNTER — Ambulatory Visit (INDEPENDENT_AMBULATORY_CARE_PROVIDER_SITE_OTHER): Payer: Medicare Other | Admitting: Cardiology

## 2016-09-20 ENCOUNTER — Encounter: Payer: Self-pay | Admitting: Cardiology

## 2016-09-20 VITALS — BP 104/72 | HR 78 | Ht 70.0 in | Wt 195.0 lb

## 2016-09-20 DIAGNOSIS — I482 Chronic atrial fibrillation, unspecified: Secondary | ICD-10-CM

## 2016-09-20 DIAGNOSIS — M79605 Pain in left leg: Secondary | ICD-10-CM

## 2016-09-20 DIAGNOSIS — I10 Essential (primary) hypertension: Secondary | ICD-10-CM

## 2016-09-20 DIAGNOSIS — M79604 Pain in right leg: Secondary | ICD-10-CM | POA: Diagnosis not present

## 2016-09-20 NOTE — Patient Instructions (Signed)
Medication Instructions:  The current medical regimen is effective;  continue present plan and medications.  Follow-Up: Follow up in 1 year with Dr. Hochrein in [place].  You will receive a letter in the mail 2 months before you are due.  Please call us when you receive this letter to schedule your follow up appointment.  If you need a refill on your cardiac medications before your next appointment, please call your pharmacy.  Thank you for choosing [facility]!!     

## 2016-10-02 ENCOUNTER — Other Ambulatory Visit: Payer: Self-pay | Admitting: Nurse Practitioner

## 2016-10-02 DIAGNOSIS — K219 Gastro-esophageal reflux disease without esophagitis: Secondary | ICD-10-CM

## 2016-10-09 ENCOUNTER — Encounter: Payer: Self-pay | Admitting: Nurse Practitioner

## 2016-10-09 ENCOUNTER — Ambulatory Visit (INDEPENDENT_AMBULATORY_CARE_PROVIDER_SITE_OTHER): Payer: Medicare Other | Admitting: Nurse Practitioner

## 2016-10-09 VITALS — BP 124/86 | HR 82 | Temp 96.5°F | Ht 70.0 in | Wt 198.0 lb

## 2016-10-09 DIAGNOSIS — E559 Vitamin D deficiency, unspecified: Secondary | ICD-10-CM

## 2016-10-09 DIAGNOSIS — I482 Chronic atrial fibrillation, unspecified: Secondary | ICD-10-CM

## 2016-10-09 DIAGNOSIS — I10 Essential (primary) hypertension: Secondary | ICD-10-CM

## 2016-10-09 DIAGNOSIS — K219 Gastro-esophageal reflux disease without esophagitis: Secondary | ICD-10-CM

## 2016-10-09 DIAGNOSIS — E782 Mixed hyperlipidemia: Secondary | ICD-10-CM | POA: Diagnosis not present

## 2016-10-09 DIAGNOSIS — Z6827 Body mass index (BMI) 27.0-27.9, adult: Secondary | ICD-10-CM | POA: Diagnosis not present

## 2016-10-09 NOTE — Progress Notes (Signed)
Subjective:    Patient ID: Frank Cook, male    DOB: 08/07/1931, 83 y.o.   MRN: [id number]  HPI ALEKZANDER CARDELL is here today for follow up of chronic medical problem.  Outpatient Encounter Prescriptions as of 10/09/2016  Medication Sig  . apixaban (ELIQUIS) 2.5 MG TABS tablet Take 1 tablet (2.5 mg total) by mouth 2 (two) times daily.  . Cholecalciferol (VITAMIN D3) 5000 units CAPS Take 5,000 Units by mouth daily.  . Glucosamine 500 MG CAPS Take 1,500 mg by mouth daily.  . hydrochlorothiazide (HYDRODIURIL) 25 MG tablet Take 1 tablet (25 mg total) by mouth daily.  . metoprolol succinate (TOPROL-XL) 100 MG 24 hr tablet Take 1 tablet (100 mg total) by mouth daily. Take with or immediately following a meal.  . Omega 3 1000 MG CAPS Take 2,000 mg by mouth daily.  . quinapril (ACCUPRIL) 40 MG tablet 40 mg. Take 1 1/2 tablets by mouth daily  . ranitidine (ZANTAC) 150 MG tablet Take 1 tablet (150 mg total) by mouth 2 (two) times daily.  . simvastatin (ZOCOR) 20 MG tablet Take 1 tablet (20 mg total) by mouth daily.   No facility-administered encounter medications on file as of 10/09/2016.     1. Chronic atrial fibrillation (HCC)  No c/o palpitations- on eliquis without any bleeding or excessive bruising  2. HYPERTENSION, BENIGN  No c/o chesp pian,SOB or Ha- does not check blood pressures at home  3. Gastroesophageal reflux disease without esophagitis  Zantac works well for him  4. BMI 27.0-27.9,adult   no recent change in weight  5. Mixed hyperlipidemia  trie sto wa=tch diet but not always successful  6. Vitamin D deficiency   not taking any supplements    New complaints: None today    Review of Systems  Constitutional: Negative for diaphoresis.  Eyes: Negative for pain.  Respiratory: Negative for shortness of breath.   Cardiovascular: Negative for chest pain, palpitations and leg swelling.  Gastrointestinal: Negative for abdominal pain.  Endocrine: Negative for polydipsia.  Skin:  Negative for rash.  Neurological: Negative for dizziness, weakness and headaches.  Hematological: Does not bruise/bleed easily.       Objective:   Physical Exam  Constitutional: He is oriented to person, place, and time. He appears well-developed and well-nourished.  HENT:  Head: Normocephalic.  Right Ear: External ear normal.  Left Ear: External ear normal.  Nose: Nose normal.  Mouth/Throat: Oropharynx is clear and moist.  Eyes: EOM are normal. Pupils are equal, round, and reactive to light.  Neck: Normal range of motion. Neck supple. No JVD present. No thyromegaly present.  Cardiovascular: Normal rate, normal heart sounds and intact distal pulses.  Exam reveals no gallop and no friction rub.   No murmur heard. Heart irregular  Pulmonary/Chest: Effort normal and breath sounds normal. No respiratory distress. He has no wheezes. He has no rales. He exhibits no tenderness.  Abdominal: Soft. Bowel sounds are normal. He exhibits no mass. There is no tenderness.  Musculoskeletal: Normal range of motion. He exhibits no edema.  Lymphadenopathy:    He has no cervical adenopathy.  Neurological: He is alert and oriented to person, place, and time. No cranial nerve deficit.  Skin: Skin is warm and dry.  Psychiatric: He has a normal mood and affect. His behavior is normal. Judgment and thought content normal.   BP 124/86   Pulse 82   Temp (!) 96.5 F (35.8 C) (Oral)   Ht 5' 10" (1.778 m)  Wt 198 lb (89.8 kg)   BMI 28.41 kg/m       Assessment & Plan:   1. Chronic atrial fibrillation ([facility])   2. HYPERTENSION, BENIGN   3. Gastroesophageal reflux disease without esophagitis   4. BMI 27.0-27.9,adult   5. Mixed hyperlipidemia   6. Vitamin D deficiency    Orders Placed This Encounter  Procedures  . CMP14+EGFR  . Lipid panel   Continue current meds Labs results pending Fall prevention Follow up in 53month  Mary-Margaret MHassell Done FNorth Palouse

## 2016-10-10 LAB — CMP14+EGFR
A/G RATIO: 1.5 (ref 1.2–2.2)
ALBUMIN: 4.5 g/dL (ref 3.5–4.7)
ALT: 20 IU/L (ref 0–44)
AST: 23 IU/L (ref 0–40)
Alkaline Phosphatase: 46 IU/L (ref 39–117)
BILIRUBIN TOTAL: 0.6 mg/dL (ref 0.0–1.2)
BUN / CREAT RATIO: 13 (ref 10–24)
BUN: 19 mg/dL (ref 8–27)
CHLORIDE: 100 mmol/L (ref 96–106)
CO2: 24 mmol/L (ref 18–29)
Calcium: 9.8 mg/dL (ref 8.6–10.2)
Creatinine, Ser: 1.47 mg/dL — ABNORMAL HIGH (ref 0.76–1.27)
GFR calc non Af Amer: 44 mL/min/{1.73_m2} — ABNORMAL LOW (ref >59)
GFR, EST AFRICAN AMERICAN: 51 mL/min/{1.73_m2} — AB (ref >59)
GLOBULIN, TOTAL: 3 g/dL (ref 1.5–4.5)
Glucose: 85 mg/dL (ref 65–99)
POTASSIUM: 4.5 mmol/L (ref 3.5–5.2)
SODIUM: 143 mmol/L (ref 134–144)
TOTAL PROTEIN: 7.5 g/dL (ref 6.0–8.5)

## 2016-10-10 LAB — LIPID PANEL
CHOL/HDL RATIO: 2.6 ratio (ref 0.0–5.0)
Cholesterol, Total: 130 mg/dL (ref 100–199)
HDL: 50 mg/dL (ref >39)
LDL Calculated: 53 mg/dL (ref 0–99)
Triglycerides: 134 mg/dL (ref 0–149)
VLDL Cholesterol Cal: 27 mg/dL (ref 5–40)

## 2017-01-05 ENCOUNTER — Other Ambulatory Visit: Payer: Self-pay | Admitting: Nurse Practitioner

## 2017-01-05 DIAGNOSIS — K219 Gastro-esophageal reflux disease without esophagitis: Secondary | ICD-10-CM

## 2017-01-09 ENCOUNTER — Ambulatory Visit (INDEPENDENT_AMBULATORY_CARE_PROVIDER_SITE_OTHER): Payer: Medicare Other

## 2017-01-09 ENCOUNTER — Encounter: Payer: Self-pay | Admitting: Nurse Practitioner

## 2017-01-09 ENCOUNTER — Ambulatory Visit (INDEPENDENT_AMBULATORY_CARE_PROVIDER_SITE_OTHER): Payer: Medicare Other | Admitting: Nurse Practitioner

## 2017-01-09 VITALS — BP 121/78 | HR 77 | Temp 96.6°F | Ht 70.0 in | Wt 193.0 lb

## 2017-01-09 DIAGNOSIS — M79672 Pain in left foot: Secondary | ICD-10-CM

## 2017-01-09 DIAGNOSIS — K219 Gastro-esophageal reflux disease without esophagitis: Secondary | ICD-10-CM

## 2017-01-09 DIAGNOSIS — E559 Vitamin D deficiency, unspecified: Secondary | ICD-10-CM

## 2017-01-09 DIAGNOSIS — I10 Essential (primary) hypertension: Secondary | ICD-10-CM | POA: Diagnosis not present

## 2017-01-09 DIAGNOSIS — Z6827 Body mass index (BMI) 27.0-27.9, adult: Secondary | ICD-10-CM | POA: Diagnosis not present

## 2017-01-09 DIAGNOSIS — E782 Mixed hyperlipidemia: Secondary | ICD-10-CM | POA: Diagnosis not present

## 2017-01-09 DIAGNOSIS — I482 Chronic atrial fibrillation, unspecified: Secondary | ICD-10-CM

## 2017-01-09 MED ORDER — METOPROLOL SUCCINATE ER 100 MG PO TB24: 100.0000 mg | ORAL_TABLET | Freq: Every day | ORAL | 3 refills | Status: DC

## 2017-01-09 MED ORDER — RANITIDINE HCL 150 MG PO TABS: ORAL_TABLET | ORAL | 1 refills | Status: DC

## 2017-01-09 MED ORDER — HYDROCHLOROTHIAZIDE 25 MG PO TABS: 25.0000 mg | ORAL_TABLET | Freq: Every day | ORAL | 1 refills | Status: DC

## 2017-01-09 MED ORDER — SIMVASTATIN 20 MG PO TABS: 20.0000 mg | ORAL_TABLET | Freq: Every day | ORAL | 1 refills | Status: DC

## 2017-01-09 MED ORDER — APIXABAN 2.5 MG PO TABS: ORAL_TABLET | ORAL | 1 refills | Status: DC

## 2017-01-09 MED ORDER — QUINAPRIL HCL 40 MG PO TABS: 40.0000 mg | ORAL_TABLET | Freq: Every day | ORAL | 1 refills | Status: DC

## 2017-01-09 NOTE — Progress Notes (Signed)
Subjective:    Patient ID: Frank Cook, male    DOB: Sep 18, 1931, 83 y.o.   MRN: [id number]  HPI Frank Cook is here today for follow up of chronic medical problem.  Outpatient Encounter Prescriptions as of 01/09/2017  Medication Sig  . apixaban (ELIQUIS) 2.5 MG TABS tablet Take 1 tablet (2.5 mg total) by mouth 2 (two) times daily.  . Cholecalciferol (VITAMIN D3) 5000 units CAPS Take 5,000 Units by mouth daily.  . Glucosamine 500 MG CAPS Take 1,500 mg by mouth daily.  . hydrochlorothiazide (HYDRODIURIL) 25 MG tablet Take 1 tablet (25 mg total) by mouth daily.  . metoprolol succinate (TOPROL-XL) 100 MG 24 hr tablet Take 1 tablet (100 mg total) by mouth daily. Take with or immediately following a meal.  . Omega 3 1000 MG CAPS Take 2,000 mg by mouth daily.  . quinapril (ACCUPRIL) 40 MG tablet 40 mg. Take 1 1/2 tablets by mouth daily  . ranitidine (ZANTAC) 150 MG tablet TAKE  (1)  TABLET TWICE A DAY.  . simvastatin (ZOCOR) 20 MG tablet Take 1 tablet (20 mg total) by mouth daily.   No facility-administered encounter medications on file as of 01/09/2017.     1. HYPERTENSION, BENIGN  Patient managed with metoprolol, quinapril, and HCTZ.  Patient does check blood pressure at home intermittently.  2. Chronic atrial fibrillation (HCC)  Thrombus prophylaxis with Eliquis.  No issues at this time.  Patient follows with cardiology every 6 months.  3. Gastroesophageal reflux disease without esophagitis Sometimes managed with ranitidine as needed.   4. Mixed hyperlipidemia  Managed with simvastatin and watching fat intake.  5. Vitamin D deficiency  Patient takes a vitamin D supplement.  6. BMI 27.0-27.9,adult  No significant weight gain or loss since previous visit.    New complaints: Patient fell about 2 weeks ago and hurt his left foot.  Foot is still hurting and swelling.  Patient sent to x-ray for assessment.  Social history:    Review of Systems  Constitutional: Negative for  activity change, appetite change and fatigue.  Respiratory: Negative for cough, shortness of breath and wheezing.   Cardiovascular: Negative for chest pain and palpitations.  Gastrointestinal: Negative for abdominal pain.  Musculoskeletal:       Left foot pain s/p fall in the kitchen about 2 weeks ago   Neurological: Negative for dizziness, light-headedness and headaches.  All other systems reviewed and are negative.      Objective:   Physical Exam  Constitutional: He is oriented to person, place, and time. He appears well-developed and well-nourished. No distress.  HENT:  Head: Normocephalic.  Right Ear: External ear normal.  Left Ear: External ear normal.  Mouth/Throat: Oropharynx is clear and moist.  Eyes: Pupils are equal, round, and reactive to light.  Neck: Normal range of motion. Neck supple. No thyromegaly present.  Cardiovascular: Normal rate, regular rhythm, normal heart sounds and intact distal pulses.   No murmur heard. Pulmonary/Chest: Effort normal and breath sounds normal. No respiratory distress. He has no wheezes.  Abdominal: Soft. Bowel sounds are normal. He exhibits no distension. There is no tenderness.  Musculoskeletal: Normal range of motion.  Lymphadenopathy:    He has no cervical adenopathy.  Neurological: He is alert and oriented to person, place, and time.  Skin: Skin is warm and dry.  Psychiatric: He has a normal mood and affect. His behavior is normal. Judgment and thought content normal.   BP 121/78   Pulse 77  Temp (!) 96.6 F (35.9 C) (Oral)   Ht 5' 10"  (1.778 m)   Wt 193 lb (87.5 kg)   BMI 27.69 kg/m  Left foot xray- no fracture-Preliminary reading by Ronnald Collum, FNP  [facility]        Assessment & Plan:  1. HYPERTENSION, BENIGN Low sodium diet - hydrochlorothiazide (HYDRODIURIL) 25 MG tablet; Take 1 tablet (25 mg total) by mouth daily.  Dispense: 90 tablet; Refill: 1 - metoprolol succinate (TOPROL-XL) 100 MG 24 hr tablet; Take 1 tablet  (100 mg total) by mouth daily. Take with or immediately following a meal.  Dispense: 90 tablet; Refill: 3 - quinapril (ACCUPRIL) 40 MG tablet; Take 1 tablet (40 mg total) by mouth daily. Take 1 1/2 tablets by mouth daily  Dispense: 90 tablet; Refill: 1 - CMP14+EGFR  2. Chronic atrial fibrillation (HCC) Avoid caffeine Keep follow up with cardiology - apixaban (ELIQUIS) 2.5 MG TABS tablet; Take 1 tablet (2.5 mg total) by mouth 2 (two) times daily.  Dispense: 180 tablet; Refill: 1   3. Gastroesophageal reflux disease without esophagitis Avoid spicy foods Do not eat 2 hours prior to bedtime - ranitidine (ZANTAC) 150 MG tablet; TAKE  (1)  TABLET TWICE A DAY.  Dispense: 180 tablet; Refill: 1  4. Mixed hyperlipidemia Low fat diet - simvastatin (ZOCOR) 20 MG tablet; Take 1 tablet (20 mg total) by mouth daily.  Dispense: 90 tablet; Refill: 1 - Lipid panel  5. Vitamin D deficiency  6. BMI 27.0-27.9,adult Discussed diet and exercise for person with BMI >25 Will recheck weight in 3-6 months  7. Left foot pain Wear good support shoes - DG Foot Complete Left; Future    Labs pending Health maintenance reviewed Diet and exercise encouraged Continue all meds Follow up  In 3 months    [place], FNP

## 2017-01-09 NOTE — Patient Instructions (Signed)
Foot Pain Many things can cause foot pain. Some common causes are:  An injury.  A sprain.  Arthritis.  Blisters.  Bunions.  Follow these instructions at home: Pay attention to any changes in your symptoms. Take these actions to help with your discomfort:  If directed, put ice on the affected area: ? Put ice in a plastic bag. ? Place a towel between your skin and the bag. ? Leave the ice on for 15-20 minutes, 3?4 times a day for 2 days.  Take over-the-counter and prescription medicines only as told by your health care provider.  Wear comfortable, supportive shoes that fit you well. Do not wear high heels.  Do not stand or walk for long periods of time.  Do not lift a lot of weight. This can put added pressure on your feet.  Do stretches to relieve foot pain and stiffness as told by your health care provider.  Rub your foot gently.  Keep your feet clean and dry.  Contact a health care provider if:  Your pain does not get better after a few days of self-care.  Your pain gets worse.  You cannot stand on your foot. Get help right away if:  Your foot is numb or tingling.  Your foot or toes are swollen.  Your foot or toes turn white or blue.  You have warmth and redness along your foot. This information is not intended to replace advice given to you by your health care provider. Make sure you discuss any questions you have with your health care provider. Document Released: 06/18/2015 Document Revised: 10/28/2015 Document Reviewed: 06/17/2014 Elsevier Interactive Patient Education  2018 Elsevier Inc.  

## 2017-01-10 LAB — CMP14+EGFR
ALBUMIN: 4.5 g/dL (ref 3.5–4.7)
ALT: 22 IU/L (ref 0–44)
AST: 29 IU/L (ref 0–40)
Albumin/Globulin Ratio: 1.6 (ref 1.2–2.2)
Alkaline Phosphatase: 43 IU/L (ref 39–117)
BUN / CREAT RATIO: 12 (ref 10–24)
BUN: 20 mg/dL (ref 8–27)
Bilirubin Total: 0.6 mg/dL (ref 0.0–1.2)
CALCIUM: 10.4 mg/dL — AB (ref 8.6–10.2)
CO2: 20 mmol/L (ref 20–29)
CREATININE: 1.68 mg/dL — AB (ref 0.76–1.27)
Chloride: 102 mmol/L (ref 96–106)
GFR calc Af Amer: 43 mL/min/{1.73_m2} — ABNORMAL LOW (ref >59)
GFR, EST NON AFRICAN AMERICAN: 37 mL/min/{1.73_m2} — AB (ref >59)
GLOBULIN, TOTAL: 2.8 g/dL (ref 1.5–4.5)
Glucose: 96 mg/dL (ref 65–99)
Potassium: 3.8 mmol/L (ref 3.5–5.2)
SODIUM: 141 mmol/L (ref 134–144)
TOTAL PROTEIN: 7.3 g/dL (ref 6.0–8.5)

## 2017-01-10 LAB — LIPID PANEL
CHOL/HDL RATIO: 2.5 ratio (ref 0.0–5.0)
Cholesterol, Total: 122 mg/dL (ref 100–199)
HDL: 49 mg/dL (ref >39)
LDL CALC: 47 mg/dL (ref 0–99)
TRIGLYCERIDES: 129 mg/dL (ref 0–149)
VLDL CHOLESTEROL CAL: 26 mg/dL (ref 5–40)

## 2017-04-02 DIAGNOSIS — Z23 Encounter for immunization: Secondary | ICD-10-CM | POA: Diagnosis not present

## 2017-04-12 ENCOUNTER — Ambulatory Visit (INDEPENDENT_AMBULATORY_CARE_PROVIDER_SITE_OTHER): Payer: Medicare Other | Admitting: Nurse Practitioner

## 2017-04-12 ENCOUNTER — Encounter: Payer: Self-pay | Admitting: Nurse Practitioner

## 2017-04-12 VITALS — BP 116/77 | HR 70 | Temp 96.7°F | Ht 70.0 in | Wt 186.0 lb

## 2017-04-12 DIAGNOSIS — E559 Vitamin D deficiency, unspecified: Secondary | ICD-10-CM

## 2017-04-12 DIAGNOSIS — K219 Gastro-esophageal reflux disease without esophagitis: Secondary | ICD-10-CM | POA: Diagnosis not present

## 2017-04-12 DIAGNOSIS — I482 Chronic atrial fibrillation, unspecified: Secondary | ICD-10-CM

## 2017-04-12 DIAGNOSIS — E782 Mixed hyperlipidemia: Secondary | ICD-10-CM

## 2017-04-12 DIAGNOSIS — I10 Essential (primary) hypertension: Secondary | ICD-10-CM

## 2017-04-12 DIAGNOSIS — Z6827 Body mass index (BMI) 27.0-27.9, adult: Secondary | ICD-10-CM

## 2017-04-12 LAB — CMP14+EGFR
ALBUMIN: 4.3 g/dL (ref 3.5–4.7)
ALT: 21 IU/L (ref 0–44)
AST: 28 IU/L (ref 0–40)
Albumin/Globulin Ratio: 1.5 (ref 1.2–2.2)
Alkaline Phosphatase: 44 IU/L (ref 39–117)
BILIRUBIN TOTAL: 0.5 mg/dL (ref 0.0–1.2)
BUN / CREAT RATIO: 12 (ref 10–24)
BUN: 21 mg/dL (ref 8–27)
CALCIUM: 9.9 mg/dL (ref 8.6–10.2)
CHLORIDE: 101 mmol/L (ref 96–106)
CO2: 27 mmol/L (ref 20–29)
CREATININE: 1.7 mg/dL — AB (ref 0.76–1.27)
GFR, EST AFRICAN AMERICAN: 42 mL/min/{1.73_m2} — AB (ref >59)
GFR, EST NON AFRICAN AMERICAN: 36 mL/min/{1.73_m2} — AB (ref >59)
GLUCOSE: 103 mg/dL — AB (ref 65–99)
Globulin, Total: 2.8 g/dL (ref 1.5–4.5)
Potassium: 4.1 mmol/L (ref 3.5–5.2)
Sodium: 141 mmol/L (ref 134–144)
TOTAL PROTEIN: 7.1 g/dL (ref 6.0–8.5)

## 2017-04-12 LAB — LIPID PANEL
Chol/HDL Ratio: 2.7 ratio (ref 0.0–5.0)
Cholesterol, Total: 125 mg/dL (ref 100–199)
HDL: 47 mg/dL
LDL Calculated: 54 mg/dL (ref 0–99)
Triglycerides: 120 mg/dL (ref 0–149)
VLDL Cholesterol Cal: 24 mg/dL (ref 5–40)

## 2017-04-12 NOTE — Addendum Note (Signed)
Addended by: Bennie PieriniMARTIN, MARY-MARGARET on: 04/12/2017 12:15 PM   Modules accepted: Orders

## 2017-04-12 NOTE — Progress Notes (Addendum)
Subjective:    Patient ID: Frank Cook, male    DOB: [birth year], 83 y.o.   MRN: [id number]  HPI  Frank Cook is here today for follow up of chronic medical problem.  Outpatient Encounter Medications as of 04/12/2017  Medication Sig  . apixaban (ELIQUIS) 2.5 MG TABS tablet Take 1 tablet (2.5 mg total) by mouth 2 (two) times daily.  . Cholecalciferol (VITAMIN D3) 5000 units CAPS Take 5,000 Units by mouth daily.  . Glucosamine 500 MG CAPS Take 1,500 mg by mouth daily.  . hydrochlorothiazide (HYDRODIURIL) 25 MG tablet Take 1 tablet (25 mg total) by mouth daily.  . metoprolol succinate (TOPROL-XL) 100 MG 24 hr tablet Take 1 tablet (100 mg total) by mouth daily. Take with or immediately following a meal.  . Omega 3 1000 MG CAPS Take 2,000 mg by mouth daily.  . quinapril (ACCUPRIL) 40 MG tablet Take 1 tablet (40 mg total) by mouth daily. Take 1 1/2 tablets by mouth daily  . ranitidine (ZANTAC) 150 MG tablet TAKE  (1)  TABLET TWICE A DAY.  . simvastatin (ZOCOR) 20 MG tablet Take 1 tablet (20 mg total) by mouth daily.   No facility-administered encounter medications on file as of 04/12/2017.     1. HYPERTENSION, BENIGN  No c/o chest pain, sob or headache. Does not check blood pressure at home. BP Readings from Last 3 Encounters:  04/12/17 116/77  01/09/17 121/78  10/09/16 124/86     2. Chronic atrial fibrillation (HCC)  Says that he does not feel palpitations  3. Gastroesophageal reflux disease without esophagitis  Takes zantac on as needed basis  4. Vitamin D deficiency  Does not always remember to take his vitamin d   5. Mixed hyperlipidemia  Not watching diet  6. BMI 27.0-27.9,adult  No recent weight changes    New complaints: None today  Social history: His wife is currently in nursing home after a fall at home- she is getting physical therapy.    Review of Systems  Constitutional: Negative for activity change and appetite change.  HENT: Negative.   Eyes: Negative  for pain.  Respiratory: Negative for shortness of breath.   Cardiovascular: Negative for chest pain, palpitations and leg swelling.  Gastrointestinal: Negative for abdominal pain.  Endocrine: Negative for polydipsia.  Genitourinary: Negative.   Skin: Negative for rash.  Neurological: Negative for dizziness, weakness and headaches.  Hematological: Does not bruise/bleed easily.  Psychiatric/Behavioral: Negative.   All other systems reviewed and are negative.      Objective:   Physical Exam  Constitutional: He is oriented to person, place, and time. He appears well-developed and well-nourished.  HENT:  Head: Normocephalic.  Right Ear: External ear normal.  Left Ear: External ear normal.  Nose: Nose normal.  Mouth/Throat: Oropharynx is clear and moist.  Eyes: EOM are normal. Pupils are equal, round, and reactive to light.  Neck: Normal range of motion. Neck supple. No JVD present. No thyromegaly present.  Cardiovascular: Normal rate, regular rhythm, normal heart sounds and intact distal pulses. Exam reveals no gallop and no friction rub.  No murmur heard. Pulmonary/Chest: Effort normal and breath sounds normal. No respiratory distress. He has no wheezes. He has no rales. He exhibits no tenderness.  Abdominal: Soft. Bowel sounds are normal. He exhibits no mass. There is no tenderness.  Genitourinary: Prostate normal and penis normal.  Musculoskeletal: Normal range of motion. He exhibits no edema.  Lymphadenopathy:    He has no cervical adenopathy.  Neurological: He is alert and oriented to person, place, and time. No cranial nerve deficit.  Skin: Skin is warm and dry.  Psychiatric: He has a normal mood and affect. His behavior is normal. Judgment and thought content normal.   BP 116/77   Pulse 70   Temp (!) 96.7 F (35.9 C) (Oral)   Ht 5' 10"  (1.778 m)   Wt 186 lb (84.4 kg)   BMI 26.69 kg/m       Assessment & Plan:  1. HYPERTENSION, BENIGN Low sodium diet  2. Chronic  atrial fibrillation (HCC) Continue eliquis  3. Gastroesophageal reflux disease without esophagitis Avoid spicy foods Do not eat 2 hours prior to bedtime  4. Vitamin D deficiency  5. Mixed hyperlipidemia Low fat diet  6. BMI 27.0-27.9,adult Discussed diet and exercise for person with BMI >25 Will recheck weight in 3-6 months  Orders Placed This Encounter  Procedures  . CMP14+EGFR  . Lipid panel     Labs pending Health maintenance reviewed Diet and exercise encouraged Continue all meds Follow up  In 3 months   [place], FNP

## 2017-04-12 NOTE — Patient Instructions (Signed)

## 2017-07-09 ENCOUNTER — Other Ambulatory Visit: Payer: Self-pay | Admitting: Nurse Practitioner

## 2017-07-09 DIAGNOSIS — I10 Essential (primary) hypertension: Secondary | ICD-10-CM

## 2017-07-09 DIAGNOSIS — E782 Mixed hyperlipidemia: Secondary | ICD-10-CM

## 2017-07-13 ENCOUNTER — Ambulatory Visit (INDEPENDENT_AMBULATORY_CARE_PROVIDER_SITE_OTHER): Payer: Medicare Other | Admitting: Nurse Practitioner

## 2017-07-13 ENCOUNTER — Encounter: Payer: Self-pay | Admitting: Nurse Practitioner

## 2017-07-13 VITALS — BP 118/75 | HR 89 | Temp 96.9°F | Ht 70.0 in | Wt 182.0 lb

## 2017-07-13 DIAGNOSIS — I482 Chronic atrial fibrillation, unspecified: Secondary | ICD-10-CM

## 2017-07-13 DIAGNOSIS — K219 Gastro-esophageal reflux disease without esophagitis: Secondary | ICD-10-CM

## 2017-07-13 DIAGNOSIS — I10 Essential (primary) hypertension: Secondary | ICD-10-CM | POA: Diagnosis not present

## 2017-07-13 DIAGNOSIS — E782 Mixed hyperlipidemia: Secondary | ICD-10-CM

## 2017-07-13 DIAGNOSIS — E559 Vitamin D deficiency, unspecified: Secondary | ICD-10-CM

## 2017-07-13 DIAGNOSIS — Z6827 Body mass index (BMI) 27.0-27.9, adult: Secondary | ICD-10-CM | POA: Diagnosis not present

## 2017-07-13 MED ORDER — RANITIDINE HCL 150 MG PO TABS: ORAL_TABLET | ORAL | 1 refills | Status: DC

## 2017-07-13 MED ORDER — HYDROCHLOROTHIAZIDE 25 MG PO TABS: 25.0000 mg | ORAL_TABLET | Freq: Every day | ORAL | 0 refills | Status: DC

## 2017-07-13 MED ORDER — SIMVASTATIN 20 MG PO TABS: 20.0000 mg | ORAL_TABLET | Freq: Every day | ORAL | 1 refills | Status: DC

## 2017-07-13 MED ORDER — METOPROLOL SUCCINATE ER 100 MG PO TB24: 100.0000 mg | ORAL_TABLET | Freq: Every day | ORAL | 3 refills | Status: DC

## 2017-07-13 MED ORDER — APIXABAN 2.5 MG PO TABS: ORAL_TABLET | ORAL | 1 refills | Status: DC

## 2017-07-13 MED ORDER — QUINAPRIL HCL 40 MG PO TABS: ORAL_TABLET | ORAL | 1 refills | Status: DC

## 2017-07-13 NOTE — Patient Instructions (Signed)

## 2017-07-13 NOTE — Progress Notes (Signed)
Subjective:    Patient ID: Frank Cook, male    DOB: [birth year], 84 y.o.   MRN: [id number]  HPI  Frank Cook is here today for follow up of chronic medical problem.  Outpatient Encounter Medications as of 07/13/2017  Medication Sig  . apixaban (ELIQUIS) 2.5 MG TABS tablet Take 1 tablet (2.5 mg total) by mouth 2 (two) times daily.  . Cholecalciferol (VITAMIN D3) 5000 units CAPS Take 5,000 Units by mouth daily.  . Glucosamine 500 MG CAPS Take 1,500 mg by mouth daily.  . hydrochlorothiazide (HYDRODIURIL) 25 MG tablet TAKE 1 TABLET DAILY  . metoprolol succinate (TOPROL-XL) 100 MG 24 hr tablet Take 1 tablet (100 mg total) by mouth daily. Take with or immediately following a meal.  . Omega 3 1000 MG CAPS Take 2,000 mg by mouth daily.  . quinapril (ACCUPRIL) 40 MG tablet TAKE 1 & 1/2 TABLETS ONCE DAILY  . ranitidine (ZANTAC) 150 MG tablet TAKE  (1)  TABLET TWICE A DAY.  . simvastatin (ZOCOR) 20 MG tablet TAKE 1 TABLET DAILY     1. HYPERTENSION, BENIGN  No c/o chest pain, sob or heaache. Does not check blood pressures at home. BP Readings from Last 3 Encounters:  04/12/17 116/77  01/09/17 121/78  10/09/16 124/86     2. Chronic atrial fibrillation ([place])  He denies feeling heart palpitations.  3. Gastroesophageal reflux disease without esophagitis  Takes zantac when needed- 3-4 x a week  4. Mixed hyperlipidemia  Not watching diet very closely  5. Vitamin D deficiency  Takes vitamin d OTC most days  6. 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 She was in nursing home due to fall at his last visit, and is still there. He says he is just not able to take care of her by hisself anymore.    Review of Systems  Constitutional: Negative for activity change and appetite change.  HENT: Negative.   Eyes: Negative for pain.  Respiratory: Negative for shortness of breath.   Cardiovascular: Negative for  chest pain, palpitations and leg swelling.  Gastrointestinal: Negative for abdominal pain.  Endocrine: Negative for polydipsia.  Genitourinary: Negative.   Skin: Negative for rash.  Neurological: Negative for dizziness, weakness and headaches.  Hematological: Does not bruise/bleed easily.  Psychiatric/Behavioral: Negative.   All other systems reviewed and are negative.      Objective:   Physical Exam  Constitutional: He is oriented to person, place, and time. He appears well-developed and well-nourished.  HENT:  Head: Normocephalic.  Right Ear: External ear normal.  Left Ear: External ear normal.  Nose: Nose normal.  Mouth/Throat: Oropharynx is clear and moist.  Eyes: EOM are normal. Pupils are equal, round, and reactive to light.  Neck: Normal range of motion. Neck supple. No JVD present. No thyromegaly present.  Cardiovascular: Normal rate, regular rhythm, normal heart sounds and intact distal pulses. Exam reveals no gallop and no friction rub.  No murmur heard. Pulmonary/Chest: Effort normal and breath sounds normal. No respiratory distress. He has no wheezes. He has no rales. He exhibits no tenderness.  Abdominal: Soft. Bowel sounds are normal. He exhibits no mass. There is no tenderness.  Genitourinary: Prostate normal and penis normal.  Musculoskeletal: Normal range of motion. He exhibits no edema.  Lymphadenopathy:    He has no cervical adenopathy.  Neurological: He is alert and oriented to person, place, and time. No  cranial nerve deficit.  Skin: Skin is warm and dry.  Psychiatric: He has a normal mood and affect. His behavior is normal. Judgment and thought content normal.   BP 118/75   Pulse 89   Temp (!) 96.9 F (36.1 C) (Oral)   Ht 5' 10"  (1.778 m)   Wt 182 lb (82.6 kg)   BMI 26.11 kg/m       Assessment & Plan:  1. HYPERTENSION, BENIGN Low sodium diet - quinapril (ACCUPRIL) 40 MG tablet; TAKE 1 & 1/2 TABLETS ONCE DAILY  Dispense: 135 tablet; Refill: 1 -  hydrochlorothiazide (HYDRODIURIL) 25 MG tablet; Take 1 tablet (25 mg total) by mouth daily.  Dispense: 90 tablet; Refill: 0 - metoprolol succinate (TOPROL-XL) 100 MG 24 hr tablet; Take 1 tablet (100 mg total) by mouth daily. Take with or immediately following a meal.  Dispense: 90 tablet; Refill: 3 - CMP14+EGFR - Lipid panel  2. Chronic atrial fibrillation (HCC) Avoid caffeine - apixaban (ELIQUIS) 2.5 MG TABS tablet; Take 1 tablet (2.5 mg total) by mouth 2 (two) times daily.  Dispense: 180 tablet; Refill: 1  3. Gastroesophageal reflux disease without esophagitis Avoid spicy foods Do not eat 2 hours prior to bedtime - ranitidine (ZANTAC) 150 MG tablet; TAKE  (1)  TABLET TWICE A DAY.  Dispense: 180 tablet; Refill: 1  4. Mixed hyperlipidemia Low fat diet - simvastatin (ZOCOR) 20 MG tablet; Take 1 tablet (20 mg total) by mouth daily.  Dispense: 90 tablet; Refill: 1  5. Vitamin D deficiency  6. BMI 27.0-27.9,adult Discussed diet and exercise for person with BMI >25 Will recheck weight in 3-6 months     Labs pending Health maintenance reviewed Diet and exercise encouraged Continue all meds Follow up  In 3 months   [place], FNP

## 2017-07-14 LAB — CMP14+EGFR
ALK PHOS: 51 IU/L (ref 39–117)
ALT: 15 IU/L (ref 0–44)
AST: 24 IU/L (ref 0–40)
Albumin/Globulin Ratio: 1.5 (ref 1.2–2.2)
Albumin: 4.3 g/dL (ref 3.5–4.7)
BILIRUBIN TOTAL: 0.5 mg/dL (ref 0.0–1.2)
BUN/Creatinine Ratio: 16 (ref 10–24)
BUN: 21 mg/dL (ref 8–27)
CHLORIDE: 100 mmol/L (ref 96–106)
CO2: 23 mmol/L (ref 20–29)
Calcium: 9.9 mg/dL (ref 8.6–10.2)
Creatinine, Ser: 1.35 mg/dL — ABNORMAL HIGH (ref 0.76–1.27)
GFR calc non Af Amer: 48 mL/min/{1.73_m2} — ABNORMAL LOW (ref >59)
GFR, EST AFRICAN AMERICAN: 56 mL/min/{1.73_m2} — AB (ref >59)
GLUCOSE: 92 mg/dL (ref 65–99)
Globulin, Total: 2.8 g/dL (ref 1.5–4.5)
Potassium: 4.3 mmol/L (ref 3.5–5.2)
Sodium: 142 mmol/L (ref 134–144)
TOTAL PROTEIN: 7.1 g/dL (ref 6.0–8.5)

## 2017-07-14 LAB — LIPID PANEL
CHOLESTEROL TOTAL: 128 mg/dL (ref 100–199)
Chol/HDL Ratio: 2.6 ratio (ref 0.0–5.0)
HDL: 49 mg/dL (ref >39)
LDL Calculated: 55 mg/dL (ref 0–99)
Triglycerides: 120 mg/dL (ref 0–149)
VLDL CHOLESTEROL CAL: 24 mg/dL (ref 5–40)

## 2017-07-25 ENCOUNTER — Ambulatory Visit: Payer: Self-pay | Admitting: Pharmacist

## 2017-07-26 ENCOUNTER — Ambulatory Visit (INDEPENDENT_AMBULATORY_CARE_PROVIDER_SITE_OTHER): Payer: Medicare Other | Admitting: *Deleted

## 2017-07-26 VITALS — BP 116/71 | HR 80 | Ht 70.0 in | Wt 181.8 lb

## 2017-07-26 DIAGNOSIS — Z0000 Encounter for general adult medical examination without abnormal findings: Secondary | ICD-10-CM

## 2017-07-26 NOTE — Patient Instructions (Signed)
  Mr. Frank Cook , Thank you for taking time to come for your Medicare Wellness Visit. I appreciate your ongoing commitment to your health goals. Please review the following plan we discussed and let me know if I can assist you in the future.   These are the goals we discussed: Goals    . Exercise 3x per week (30 min per time)       This is a list of the screening recommended for you and due dates:  Health Maintenance  Topic Date Due  . Tetanus Vaccine  03/14/2021  . Flu Shot  Completed  . Pneumonia vaccines  Completed

## 2017-07-26 NOTE — Progress Notes (Addendum)
Subjective:   Frank Cook is a 84 y.o. male who presents for Medicare Annual/Subsequent preventive examination. 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 He enjoys watching nascar. He does walk back and forth to the mailbox daily. He states his diet is semi healthy. He lives by himself as his wife has recently been placed in a nursing home. They did not have any children but he does have a niece and nephew that help him. He does not have any pets, he does have stairs in the home going into the basement, and he does not have any rugs in the home. He states his health is the same as it was last year at this time. He has not had any hospitalizations or surgeries in the last year.   Review of Systems:   Cardiac Risk Factors include: advanced age (>155men, 61>65 women);hypertension;male gender;Other (see comment), Risk factor comments: A Fib     Objective:    Vitals: BP 116/71   Pulse 80   Ht 5\' 10"  (1.778 m)   Wt 181 lb 12.8 oz (82.5 kg)   BMI 26.09 kg/m   Body mass index is 26.09 kg/m.  Advanced Directives 07/26/2017 07/19/2016  Does Patient Have a Medical Advance Directive? Yes Yes  Type of Estate agentAdvance Directive Healthcare Power of [place];Living [place] Healthcare Power of [place];Living will  Does patient want to make changes to medical advance directive? - No - Patient declined  Copy of Healthcare Power of Attorney in Chart? Yes Yes    Tobacco Social History   Tobacco Use  Smoking Status Never Smoker  Smokeless Tobacco Never Used  Tobacco Comment   Never smoker      Counseling given: No Comment: Never smoker  Patient has never smoked   Clinical Intake:     Pain : No/denies pain     Nutritional Status: BMI 25 -29 Overweight Nutritional Risks: None Diabetes: No  How often do you need to have someone help you when you read instructions, pamphlets, or other written materials from your doctor or pharmacy?: 1 - Never  Interpreter Needed?: No     Past Medical History:  Diagnosis Date  . Atrial fibrillation (HCC)   . Colon polyps   . GERD (gastroesophageal reflux disease)   . Hyperlipidemia   . Hypertension   . Osteopenia    Past Surgical History:  Procedure Laterality Date  . None     Family History  Problem Relation Age of Onset  . COPD Mother   . Cancer Father        Bone  . Cancer Brother        Bone   Social History   Socioeconomic History  . Marital status: Married    Spouse name: None  . Number of children: 0  . Years of education: None  . Highest education level: 12th grade  Social Needs  . Financial resource strain: Not hard at all  . Food insecurity - worry: Never true  . Food insecurity - inability: Never true  . Transportation needs - medical: No  . Transportation needs - non-medical: No  Occupational History  . None  Tobacco Use  . Smoking status: Never Smoker  . Smokeless tobacco: Never Used  . Tobacco comment: Never smoker   Substance and Sexual Activity  . Alcohol use: No  . Drug use: No  . Sexual activity: No  Other Topics Concern  . None  Social History Narrative  Lives with byself.  Wife is now in nursing home    Outpatient Encounter Medications as of 07/26/2017  Medication Sig  . apixaban (ELIQUIS) 2.5 MG TABS tablet Take 1 tablet (2.5 mg total) by mouth 2 (two) times daily.  . Cholecalciferol (VITAMIN D3) 5000 units CAPS Take 5,000 Units by mouth daily.  . Glucosamine 500 MG CAPS Take 1,500 mg by mouth daily.  . hydrochlorothiazide (HYDRODIURIL) 25 MG tablet Take 1 tablet (25 mg total) by mouth daily.  . metoprolol succinate (TOPROL-XL) 100 MG 24 hr tablet Take 1 tablet (100 mg total) by mouth daily. Take with or immediately following a meal.  . Omega 3 1000 MG CAPS Take 2,000 mg by mouth daily.  . quinapril (ACCUPRIL) 40 MG tablet TAKE 1 & 1/2 TABLETS ONCE DAILY  . ranitidine (ZANTAC) 150 MG tablet TAKE  (1)  TABLET TWICE A DAY.  . simvastatin (ZOCOR) 20 MG tablet Take 1  tablet (20 mg total) by mouth daily.   No facility-administered encounter medications on file as of 07/26/2017.     Activities of Daily Living In your present state of health, do you have any difficulty performing the following activities: 07/26/2017  Hearing? N  Vision? N  Difficulty concentrating or making decisions? N  Walking or climbing stairs? N  Dressing or bathing? N  Doing errands, shopping? N  Preparing Food and eating ? N  Using the Toilet? N  In the past six months, have you accidently leaked urine? N  Do you have problems with loss of bowel control? N  Managing your Medications? N  Managing your Finances? N  Housekeeping or managing your Housekeeping? N  Some recent data might be hidden    Patient Care Team: Bennie Pierini, FNP as PCP - General (Nurse Practitioner) Rollene Rotunda, MD as Consulting Physician (Cardiology)   Assessment:   This is a routine wellness examination for Frank Cook.  Exercise Activities and Dietary recommendations Current Exercise Habits: The patient does not participate in regular exercise at present  Goals    . Exercise 3x per week (30 min per time)       Fall Risk Fall Risk  07/26/2017 07/13/2017 04/12/2017 01/09/2017 10/09/2016  Falls in the past year? Yes Yes No Yes No  Number falls in past yr: 1 1 - 1 -  Injury with Fall? No No - Yes -  Comment - - - foot pain -   Is the patient's home free of loose throw rugs in walkways, pet beds, electrical cords, etc?   yes      Grab bars in the bathroom? yes      Handrails on the stairs?   yes      Adequate lighting?   yes  Timed Get Up and Go Performed:   Depression Screen PHQ 2/9 Scores 07/26/2017 07/13/2017 04/12/2017 01/09/2017  PHQ - 2 Score 0 0 0 0    Cognitive Function MMSE - Mini Mental State Exam 07/26/2017 07/19/2016  Orientation to time 5 5  Orientation to Place 5 5  Registration 3 3  Attention/ Calculation 5 4  Recall 3 1  Language- name 2 objects 2 2  Language- repeat 1 1    Language- follow 3 step command 3 3  Language- read & follow direction 1 1  Write a sentence 1 1  Copy design 1 1  Total score 30 27        Immunization History  Administered Date(s) Administered  . Influenza, High Dose Seasonal PF  04/03/2016, 04/02/2017  . Influenza,inj,Quad PF,6+ Mos 02/24/2013, 03/16/2014, 03/29/2015  . Pneumococcal Conjugate-13 09/17/2014  . Pneumococcal Polysaccharide-23 06/06/1999  . Tdap 03/06/2011  . Zoster 04/06/2007    Qualifies for Shingles Vaccine? Yes   Screening Tests Health Maintenance  Topic Date Due  . TETANUS/TDAP  03/14/2021  . INFLUENZA VACCINE  Completed  . PNA vac Low Risk Adult  Completed   Cancer Screenings: Lung: Low Dose CT Chest recommended if Age 26-80 years, 30 pack-year currently smoking OR have quit w/in 15years. Patient does not qualify. Colorectal: Up to date   Additional Screenings:  Hepatitis B/HIV/Syphillis: Hepatitis C Screening:     Plan:   Patient to follow up with Mary-Margaret Daphine Deutscher, FNP as planned.  Patient will call Dr. Lindaann Slough office for yearly check.  Patient will call and schedule appointment for a eye exam  I have personally reviewed and noted the following in the patient's chart:   . Medical and social history . Use of alcohol, tobacco or illicit drugs  . Current medications and supplements . Functional ability and status . Nutritional status . Physical activity . Advanced directives . List of other physicians . Hospitalizations, surgeries, and ER visits in previous 12 months . Vitals . Screenings to include cognitive, depression, and falls . Referrals and appointments  In addition, I have reviewed and discussed with patient certain preventive protocols, quality metrics, and best practice recommendations. A written personalized care plan for preventive services as well as general preventive health recommendations were provided to patient.     Sherron Monday, LPN  1/61/0960 I have  reviewed and agree with the above AWV documentation.   Mary-Margaret Daphine Deutscher, FNP

## 2017-10-11 ENCOUNTER — Ambulatory Visit (INDEPENDENT_AMBULATORY_CARE_PROVIDER_SITE_OTHER): Payer: Medicare Other | Admitting: Nurse Practitioner

## 2017-10-11 ENCOUNTER — Encounter: Payer: Self-pay | Admitting: Nurse Practitioner

## 2017-10-11 VITALS — BP 137/85 | HR 66 | Temp 96.8°F | Ht 70.0 in | Wt 186.0 lb

## 2017-10-11 DIAGNOSIS — E559 Vitamin D deficiency, unspecified: Secondary | ICD-10-CM

## 2017-10-11 DIAGNOSIS — E782 Mixed hyperlipidemia: Secondary | ICD-10-CM | POA: Diagnosis not present

## 2017-10-11 DIAGNOSIS — I482 Chronic atrial fibrillation, unspecified: Secondary | ICD-10-CM

## 2017-10-11 DIAGNOSIS — I10 Essential (primary) hypertension: Secondary | ICD-10-CM

## 2017-10-11 DIAGNOSIS — K219 Gastro-esophageal reflux disease without esophagitis: Secondary | ICD-10-CM

## 2017-10-11 DIAGNOSIS — Z6827 Body mass index (BMI) 27.0-27.9, adult: Secondary | ICD-10-CM

## 2017-10-11 MED ORDER — HYDROCHLOROTHIAZIDE 25 MG PO TABS: 25.0000 mg | ORAL_TABLET | Freq: Every day | ORAL | 0 refills | Status: DC

## 2017-10-11 MED ORDER — APIXABAN 2.5 MG PO TABS: ORAL_TABLET | ORAL | 1 refills | Status: DC

## 2017-10-11 MED ORDER — SIMVASTATIN 20 MG PO TABS: 20.0000 mg | ORAL_TABLET | Freq: Every day | ORAL | 1 refills | Status: DC

## 2017-10-11 MED ORDER — METOPROLOL SUCCINATE ER 100 MG PO TB24: 100.0000 mg | ORAL_TABLET | Freq: Every day | ORAL | 3 refills | Status: DC

## 2017-10-11 MED ORDER — QUINAPRIL HCL 40 MG PO TABS: ORAL_TABLET | ORAL | 1 refills | Status: DC

## 2017-10-11 MED ORDER — RANITIDINE HCL 150 MG PO TABS: ORAL_TABLET | ORAL | 1 refills | Status: DC

## 2017-10-11 NOTE — Patient Instructions (Signed)

## 2017-10-11 NOTE — Progress Notes (Signed)
Subjective:    Patient ID: Frank Cook, male    DOB: 27-Jun-1931, 84 y.o.   MRN: [id number]   Chief Complaint: Medical Management of Chronic Issues   HPI:  1. HYPERTENSION, BENIGN  No c/o chest pain, sob or headache. Does not check blood pressure at home. BP Readings from Last 3 Encounters:  10/11/17 137/85  07/26/17 116/71  07/13/17 118/75     2. Chronic atrial fibrillation (HCC)  denies any palpitations.  3. Gastroesophageal reflux disease without esophagitis  Takes zantac daily which keeps symptoms under control  4. Vitamin D deficiency  Takes vitamin d daily  5. Mixed hyperlipidemia  He tries not to eat much fried foods  6. BMI 27.0-27.9,adult  No recent weight changes    Outpatient Encounter Medications as of 10/11/2017  Medication Sig  . apixaban (ELIQUIS) 2.5 MG TABS tablet Take 1 tablet (2.5 mg total) by mouth 2 (two) times daily.  . Cholecalciferol (VITAMIN D3) 5000 units CAPS Take 5,000 Units by mouth daily.  . Glucosamine 500 MG CAPS Take 1,500 mg by mouth daily.  . hydrochlorothiazide (HYDRODIURIL) 25 MG tablet Take 1 tablet (25 mg total) by mouth daily.  . metoprolol succinate (TOPROL-XL) 100 MG 24 hr tablet Take 1 tablet (100 mg total) by mouth daily. Take with or immediately following a meal.  . Omega 3 1000 MG CAPS Take 2,000 mg by mouth daily.  . quinapril (ACCUPRIL) 40 MG tablet TAKE 1 & 1/2 TABLETS ONCE DAILY  . ranitidine (ZANTAC) 150 MG tablet TAKE  (1)  TABLET TWICE A DAY.  . simvastatin (ZOCOR) 20 MG tablet Take 1 tablet (20 mg total) by mouth daily.       New complaints: None today  Social history: Wife is still in nursing home and he goes daily to see her.    Review of Systems  Constitutional: Negative for activity change and appetite change.  HENT: Negative.   Eyes: Negative for pain.  Respiratory: Negative for shortness of breath.   Cardiovascular: Negative for chest pain, palpitations and leg swelling.  Gastrointestinal:  Negative for abdominal pain.  Endocrine: Negative for polydipsia.  Genitourinary: Negative.   Skin: Negative for rash.  Neurological: Negative for dizziness, weakness and headaches.  Hematological: Does not bruise/bleed easily.  Psychiatric/Behavioral: Negative.   All other systems reviewed and are negative.      Objective:   Physical Exam  Constitutional: He is oriented to person, place, and time.  HENT:  Head: Normocephalic.  Nose: Nose normal.  Mouth/Throat: Oropharynx is clear and moist.  Eyes: Pupils are equal, round, and reactive to light. EOM are normal.  Neck: Normal range of motion and phonation normal. Neck supple. No JVD present. Carotid bruit is not present. No thyroid mass and no thyromegaly present.  Cardiovascular: Normal rate and regular rhythm.  Pulmonary/Chest: Effort normal and breath sounds normal. No respiratory distress.  Abdominal: Soft. Normal appearance, normal aorta and bowel sounds are normal. There is no tenderness.  Musculoskeletal: Normal range of motion.  Lymphadenopathy:    He has no cervical adenopathy.  Neurological: He is alert and oriented to person, place, and time.  Skin: Skin is warm and dry.  Psychiatric: Judgment normal.   BP 137/85   Pulse 66   Temp (!) 96.8 F (36 C) (Oral)   Ht 5' 10"  (1.778 m)   Wt 186 lb (84.4 kg)   BMI 26.69 kg/m         Assessment & Plan:  Frank Wirt  Cook comes in today with chief complaint of Medical Management of Chronic Issues   Diagnosis and orders addressed:  1. HYPERTENSION, BENIGN Low sodium diet - hydrochlorothiazide (HYDRODIURIL) 25 MG tablet; Take 1 tablet (25 mg total) by mouth daily.  Dispense: 90 tablet; Refill: 0 - metoprolol succinate (TOPROL-XL) 100 MG 24 hr tablet; Take 1 tablet (100 mg total) by mouth daily. Take with or immediately following a meal.  Dispense: 90 tablet; Refill: 3 - quinapril (ACCUPRIL) 40 MG tablet; TAKE 1 & 1/2 TABLETS ONCE DAILY  Dispense: 135 tablet; Refill: 1 -  CMP14+EGFR  2. Chronic atrial fibrillation (HCC) Avoid caffeine - apixaban (ELIQUIS) 2.5 MG TABS tablet; Take 1 tablet (2.5 mg total) by mouth 2 (two) times daily.  Dispense: 180 tablet; Refill: 1  3. Gastroesophageal reflux disease without esophagitis Avoid spicy foods Do not eat 2 hours prior to bedtime - ranitidine (ZANTAC) 150 MG tablet; TAKE  (1)  TABLET TWICE A DAY.  Dispense: 180 tablet; Refill: 1  4. Vitamin D deficiency Take daily vitamin with vitamin c=d a nd calcium  5. Mixed hyperlipidemia Low fat diet - simvastatin (ZOCOR) 20 MG tablet; Take 1 tablet (20 mg total) by mouth daily.  Dispense: 90 tablet; Refill: 1 - Lipid panel  6. BMI 27.0-27.9,adult Discussed diet and exercise for person with BMI >25 Will recheck weight in 3-6 months   Labs pending Health Maintenance reviewed Diet and exercise encouraged  Follow up plan: 3 months   Mary-Margaret Hassell Done, FNP

## 2017-10-12 LAB — LIPID PANEL
CHOL/HDL RATIO: 2.5 ratio (ref 0.0–5.0)
Cholesterol, Total: 127 mg/dL (ref 100–199)
HDL: 51 mg/dL (ref >39)
LDL Calculated: 54 mg/dL (ref 0–99)
TRIGLYCERIDES: 109 mg/dL (ref 0–149)
VLDL CHOLESTEROL CAL: 22 mg/dL (ref 5–40)

## 2017-10-12 LAB — CMP14+EGFR
ALT: 17 IU/L (ref 0–44)
AST: 21 IU/L (ref 0–40)
Albumin/Globulin Ratio: 1.5 (ref 1.2–2.2)
Albumin: 4.4 g/dL (ref 3.5–4.7)
Alkaline Phosphatase: 49 IU/L (ref 39–117)
BUN/Creatinine Ratio: 12 (ref 10–24)
BUN: 18 mg/dL (ref 8–27)
Bilirubin Total: 0.6 mg/dL (ref 0.0–1.2)
CO2: 26 mmol/L (ref 20–29)
Calcium: 10.3 mg/dL — ABNORMAL HIGH (ref 8.6–10.2)
Chloride: 101 mmol/L (ref 96–106)
Creatinine, Ser: 1.45 mg/dL — ABNORMAL HIGH (ref 0.76–1.27)
GFR calc Af Amer: 51 mL/min/1.73 — ABNORMAL LOW
GFR calc non Af Amer: 44 mL/min/1.73 — ABNORMAL LOW
Globulin, Total: 2.9 g/dL (ref 1.5–4.5)
Glucose: 99 mg/dL (ref 65–99)
Potassium: 4.2 mmol/L (ref 3.5–5.2)
Sodium: 143 mmol/L (ref 134–144)
Total Protein: 7.3 g/dL (ref 6.0–8.5)

## 2017-10-29 NOTE — Progress Notes (Signed)
HPI The patient presented for evaluation of an abnormal EKG. The computer reading of this in October of 2016 suggested atrial fibrillation.  However, this EKG was actually sinus rhythm with PACs.  A more recent EKG in July of 2017 demonstrated atrial fibrillation.  Holter has demonstrated rate control.  He was treated with Eliquis.  He does not feel the atrial fib at all.  The patient denies any new symptoms such as chest discomfort, neck or arm discomfort. There has been no new shortness of breath, PND or orthopnea. There have been no reported palpitations, presyncope or syncope.  He had to put his wife in [place].     No Known Allergies  Current Outpatient Medications  Medication Sig Dispense Refill  . apixaban (ELIQUIS) 2.5 MG TABS tablet Take 1 tablet (2.5 mg total) by mouth 2 (two) times daily. 180 tablet 1  . Cholecalciferol (VITAMIN D3) 5000 units CAPS Take 5,000 Units by mouth daily.    . Glucosamine 500 MG CAPS Take 1,500 mg by mouth daily.    . hydrochlorothiazide (HYDRODIURIL) 25 MG tablet Take 1 tablet (25 mg total) by mouth daily. 90 tablet 0  . metoprolol succinate (TOPROL-XL) 100 MG 24 hr tablet Take 1 tablet (100 mg total) by mouth daily. Take with or immediately following a meal. 90 tablet 3  . Omega 3 1000 MG CAPS Take 2,000 mg by mouth daily.    . quinapril (ACCUPRIL) 40 MG tablet TAKE 1 & 1/2 TABLETS ONCE DAILY 135 tablet 1  . ranitidine (ZANTAC) 150 MG tablet TAKE  (1)  TABLET TWICE A DAY. 180 tablet 1  . simvastatin (ZOCOR) 20 MG tablet Take 1 tablet (20 mg total) by mouth daily. 90 tablet 1   No current facility-administered medications for this visit.     Past Medical History:  Diagnosis Date  . Atrial fibrillation (HCC)   . Colon polyps   . GERD (gastroesophageal reflux disease)   . Hyperlipidemia   . Hypertension   . Osteopenia     Past Surgical History:  Procedure Laterality Date  . None       ROS:  As stated in the HPI and negative for all  other systems.  PHYSICAL EXAM BP 118/80   Pulse 77   Ht  (1.778 m)   Wt 188 lb (85.3 kg)   BMI 26.98 kg/m   GENERAL:  Well appearing NECK:  No jugular venous distention, waveform within normal limits, carotid upstroke brisk and symmetric, no bruits, no thyromegaly LUNGS:  Clear to auscultation bilaterally CHEST:  Unremarkable HEART:  PMI not displaced or sustained,S1 and S2 within normal limits, no S3, no clicks, no rubs, no murmurs, irregular ABD:  Flat, positive bowel sounds normal in frequency in pitch, no bruits, no rebound, no guarding, no midline pulsatile mass, no hepatomegaly, no splenomegaly EXT:  2 plus pulses upper and decreased DP/PT bilateral, no edema, no cyanosis no clubbing   Lab Results  Component Value Date   CREATININE 1.45 (H) 10/11/2017     EKG: Atrial fibrillation, rate 77, axis within normal limits, right bundle branch block, no change from previous  10/31/2017   ASSESSMENT AND PLAN  ATRIAL FIB:    [name] has a CHA2DS2 - VASc score of 3 with a risk of stroke of 3.2%.   No change in therapy.   HTN:   Blood pressure is at target.  No change in therapy.   CKD:  His creatinine as above  was stable.    PVD:   He has decreased lower extremity pulses.  However, he has no symptoms.  He has no non healing ulcers.  No further testing is planned.

## 2017-10-31 ENCOUNTER — Encounter: Payer: Self-pay | Admitting: Cardiology

## 2017-10-31 ENCOUNTER — Ambulatory Visit (INDEPENDENT_AMBULATORY_CARE_PROVIDER_SITE_OTHER): Payer: Medicare Other | Admitting: Cardiology

## 2017-10-31 VITALS — BP 118/80 | HR 77 | Ht 70.0 in | Wt 188.0 lb

## 2017-10-31 DIAGNOSIS — I482 Chronic atrial fibrillation, unspecified: Secondary | ICD-10-CM

## 2017-10-31 DIAGNOSIS — I10 Essential (primary) hypertension: Secondary | ICD-10-CM | POA: Diagnosis not present

## 2017-10-31 NOTE — Patient Instructions (Signed)

## 2018-01-17 ENCOUNTER — Encounter: Payer: Self-pay | Admitting: Nurse Practitioner

## 2018-01-17 ENCOUNTER — Ambulatory Visit (INDEPENDENT_AMBULATORY_CARE_PROVIDER_SITE_OTHER): Payer: Medicare Other | Admitting: Nurse Practitioner

## 2018-01-17 VITALS — BP 128/78 | HR 91 | Temp 96.8°F | Ht 70.0 in | Wt 186.0 lb

## 2018-01-17 DIAGNOSIS — Z6827 Body mass index (BMI) 27.0-27.9, adult: Secondary | ICD-10-CM

## 2018-01-17 DIAGNOSIS — K219 Gastro-esophageal reflux disease without esophagitis: Secondary | ICD-10-CM | POA: Diagnosis not present

## 2018-01-17 DIAGNOSIS — I482 Chronic atrial fibrillation, unspecified: Secondary | ICD-10-CM

## 2018-01-17 DIAGNOSIS — E559 Vitamin D deficiency, unspecified: Secondary | ICD-10-CM

## 2018-01-17 DIAGNOSIS — E782 Mixed hyperlipidemia: Secondary | ICD-10-CM

## 2018-01-17 DIAGNOSIS — I10 Essential (primary) hypertension: Secondary | ICD-10-CM | POA: Diagnosis not present

## 2018-01-17 LAB — CMP14+EGFR
ALT: 17 IU/L (ref 0–44)
AST: 23 IU/L (ref 0–40)
Albumin/Globulin Ratio: 1.5 (ref 1.2–2.2)
Albumin: 4.4 g/dL (ref 3.5–4.7)
Alkaline Phosphatase: 43 IU/L (ref 39–117)
BUN/Creatinine Ratio: 13 (ref 10–24)
BUN: 18 mg/dL (ref 8–27)
Bilirubin Total: 0.6 mg/dL (ref 0.0–1.2)
CO2: 24 mmol/L (ref 20–29)
CREATININE: 1.41 mg/dL — AB (ref 0.76–1.27)
Calcium: 10.1 mg/dL (ref 8.6–10.2)
Chloride: 102 mmol/L (ref 96–106)
GFR, EST AFRICAN AMERICAN: 53 mL/min/{1.73_m2} — AB (ref >59)
GFR, EST NON AFRICAN AMERICAN: 45 mL/min/{1.73_m2} — AB (ref >59)
GLUCOSE: 87 mg/dL (ref 65–99)
Globulin, Total: 3 g/dL (ref 1.5–4.5)
POTASSIUM: 4.3 mmol/L (ref 3.5–5.2)
Sodium: 143 mmol/L (ref 134–144)
TOTAL PROTEIN: 7.4 g/dL (ref 6.0–8.5)

## 2018-01-17 LAB — LIPID PANEL
CHOL/HDL RATIO: 2.7 ratio (ref 0.0–5.0)
Cholesterol, Total: 127 mg/dL (ref 100–199)
HDL: 47 mg/dL (ref >39)
LDL Calculated: 56 mg/dL (ref 0–99)
TRIGLYCERIDES: 122 mg/dL (ref 0–149)
VLDL CHOLESTEROL CAL: 24 mg/dL (ref 5–40)

## 2018-01-17 NOTE — Patient Instructions (Signed)

## 2018-01-17 NOTE — Progress Notes (Signed)
Subjective:    Patient ID: Frank Cook, male    DOB: [birth year], 84 y.o.   MRN: [id number]   Chief Complaint: Medical Management of Chronic Issues   HPI:  1. HYPERTENSION, BENIGN  No c/oo chest pain, sob or headache. Does not check blood pressure at home. BP Readings from Last 3 Encounters:  01/17/18 128/78  10/31/17 118/80  10/11/17 137/85     2. Chronic atrial fibrillation (HCC)  No palpitations. Is on eliquis. Last saw cardiologist on 10/31/17 according to chart no chenge to plan of care . To follow up in 1 year.  3. Gastroesophageal reflux disease without esophagitis  takes zantac daily which reallly helps if he does not take then he gets symptoms.  4. Vitamin D deficiency  Takes daily vitamin d supplement  5. Mixed hyperlipidemia  Not really watching diet-   6. BMI 27.0-27.9,adult  No recent weight changes    Outpatient Encounter Medications as of 01/17/2018  Medication Sig  . apixaban (ELIQUIS) 2.5 MG TABS tablet Take 1 tablet (2.5 mg total) by mouth 2 (two) times daily.  . Cholecalciferol (VITAMIN D3) 5000 units CAPS Take 5,000 Units by mouth daily.  . Glucosamine 500 MG CAPS Take 1,500 mg by mouth daily.  . hydrochlorothiazide (HYDRODIURIL) 25 MG tablet Take 1 tablet (25 mg total) by mouth daily.  . metoprolol succinate (TOPROL-XL) 100 MG 24 hr tablet Take 1 tablet (100 mg total) by mouth daily. Take with or immediately following a meal.  . Omega 3 1000 MG CAPS Take 2,000 mg by mouth daily.  . quinapril (ACCUPRIL) 40 MG tablet TAKE 1 & 1/2 TABLETS ONCE DAILY  . ranitidine (ZANTAC) 150 MG tablet TAKE  (1)  TABLET TWICE A DAY.  . simvastatin (ZOCOR) 20 MG tablet Take 1 tablet (20 mg total) by mouth daily.      New complaints: None today  Social history: Wife still in nursing home. Says her dementia has gotten no worse. Still able to recognize him, he visits her almost everyday.   Review of Systems  Constitutional: Negative for activity change and appetite  change.  HENT: Negative.   Eyes: Negative for pain.  Respiratory: Negative for shortness of breath.   Cardiovascular: Negative for chest pain, palpitations and leg swelling.  Gastrointestinal: Negative for abdominal pain.  Endocrine: Negative for polydipsia.  Genitourinary: Negative.   Musculoskeletal: Positive for gait problem.  Skin: Negative for rash.  Neurological: Negative for dizziness, weakness and headaches.  Hematological: Does not bruise/bleed easily.  Psychiatric/Behavioral: Negative.   All other systems reviewed and are negative.      Objective:   Physical Exam  Constitutional: He is oriented to person, place, and time. He appears well-developed and well-nourished. No distress.  HENT:  Head: Normocephalic.  Nose: Nose normal.  Mouth/Throat: Oropharynx is clear and moist.  Eyes: Pupils are equal, round, and reactive to light. EOM are normal.  Neck: Normal range of motion and phonation normal. Neck supple. No JVD present. Carotid bruit is not present. No thyroid mass and no thyromegaly present.  Cardiovascular: Normal rate and regular rhythm.  Pulmonary/Chest: Effort normal and breath sounds normal. No respiratory distress.  Abdominal: Soft. Normal appearance, normal aorta and bowel sounds are normal. There is no tenderness.  Musculoskeletal: Normal range of motion.  Gait slow and steady  Lymphadenopathy:    He has no cervical adenopathy.  Neurological: He is alert and oriented to person, place, and time.  Skin: Skin is warm and dry.  Psychiatric: He has a normal mood and affect. His behavior is normal. Judgment and thought content normal.  Nursing note and vitals reviewed.  BP 128/78   Pulse 91   Temp (!) 96.8 F (36 C) (Oral)   Ht 5' 10"  (1.778 m)   Wt 186 lb (84.4 kg)   BMI 26.69 kg/m         Assessment & Plan:  Frank Cook comes in today with chief complaint of Medical Management of Chronic Issues   Diagnosis and orders addressed:  1.  HYPERTENSION, BENIGN Dash diet - CMP14+EGFR  2. Chronic atrial fibrillation (HCC) Avoid caffeine Keep follow up appointment with cardiology  3. Gastroesophageal reflux disease without esophagitis Avoid spicy foods Do not eat 2 hours prior to bedtime  4. Vitamin D deficiency  5. Mixed hyperlipidemia Low fat diet - Lipid panel  6. BMI 27.0-27.9,adult Discussed diet and exercise for person with BMI >25 Will recheck weight in 3-6 months   Labs pending Health Maintenance reviewed Diet and exercise encouraged  Follow up plan: 3 months   Mary-Margaret Hassell Done, FNP

## 2018-02-20 DIAGNOSIS — H2513 Age-related nuclear cataract, bilateral: Secondary | ICD-10-CM | POA: Diagnosis not present

## 2018-03-21 DIAGNOSIS — Z23 Encounter for immunization: Secondary | ICD-10-CM | POA: Diagnosis not present

## 2018-04-23 ENCOUNTER — Encounter: Payer: Self-pay | Admitting: Nurse Practitioner

## 2018-04-23 ENCOUNTER — Ambulatory Visit (INDEPENDENT_AMBULATORY_CARE_PROVIDER_SITE_OTHER): Payer: Medicare Other | Admitting: Nurse Practitioner

## 2018-04-23 VITALS — BP 132/77 | HR 75 | Temp 96.8°F | Ht 70.0 in | Wt 188.0 lb

## 2018-04-23 DIAGNOSIS — I4811 Longstanding persistent atrial fibrillation: Secondary | ICD-10-CM | POA: Diagnosis not present

## 2018-04-23 DIAGNOSIS — E559 Vitamin D deficiency, unspecified: Secondary | ICD-10-CM

## 2018-04-23 DIAGNOSIS — E782 Mixed hyperlipidemia: Secondary | ICD-10-CM

## 2018-04-23 DIAGNOSIS — Z6827 Body mass index (BMI) 27.0-27.9, adult: Secondary | ICD-10-CM

## 2018-04-23 DIAGNOSIS — I10 Essential (primary) hypertension: Secondary | ICD-10-CM

## 2018-04-23 DIAGNOSIS — K219 Gastro-esophageal reflux disease without esophagitis: Secondary | ICD-10-CM

## 2018-04-23 MED ORDER — RANITIDINE HCL 150 MG PO TABS: ORAL_TABLET | ORAL | 1 refills | Status: DC

## 2018-04-23 MED ORDER — QUINAPRIL HCL 40 MG PO TABS: ORAL_TABLET | ORAL | 1 refills | Status: DC

## 2018-04-23 MED ORDER — APIXABAN 2.5 MG PO TABS: ORAL_TABLET | ORAL | 1 refills | Status: DC

## 2018-04-23 MED ORDER — METOPROLOL SUCCINATE ER 100 MG PO TB24: 100.0000 mg | ORAL_TABLET | Freq: Every day | ORAL | 1 refills | Status: DC

## 2018-04-23 MED ORDER — SIMVASTATIN 20 MG PO TABS: 20.0000 mg | ORAL_TABLET | Freq: Every day | ORAL | 1 refills | Status: DC

## 2018-04-23 MED ORDER — HYDROCHLOROTHIAZIDE 25 MG PO TABS: 25.0000 mg | ORAL_TABLET | Freq: Every day | ORAL | 1 refills | Status: DC

## 2018-04-23 NOTE — Patient Instructions (Signed)

## 2018-04-23 NOTE — Progress Notes (Signed)
Subjective:    Patient ID: Frank Cook, male    DOB: [birth year], 84 y.o.   MRN: [id number]   Chief Complaint: Medical Management of Chronic Issues   HPI:  1. Longstanding persistent atrial fibrillation  He is currently on eliquis.  Has no bleeding problems.  2. HYPERTENSION, BENIGN  No c/o chest pain, sob or headache. Does not check blood pressure at home. BP Readings from Last 3 Encounters:  04/23/18 132/77  01/17/18 128/78  10/31/17 118/80     3. Gastroesophageal reflux disease without esophagitis  Patient is on zantac daily. Works well to keep symptoms  Under ocntrol  4. Mixed hyperlipidemia  Does not really watch diet. Does no exercises  5. Vitamin D deficiency  Takes vitamin d supplements daily  6. BMI 27.0-27.9,adult  No recent weight changes    Outpatient Encounter Medications as of 04/23/2018  Medication Sig  . apixaban (ELIQUIS) 2.5 MG TABS tablet Take 1 tablet (2.5 mg total) by mouth 2 (two) times daily.  . Cholecalciferol (VITAMIN D3) 5000 units CAPS Take 5,000 Units by mouth daily.  . Glucosamine 500 MG CAPS Take 1,500 mg by mouth daily.  . hydrochlorothiazide (HYDRODIURIL) 25 MG tablet Take 1 tablet (25 mg total) by mouth daily.  . metoprolol succinate (TOPROL-XL) 100 MG 24 hr tablet Take 1 tablet (100 mg total) by mouth daily. Take with or immediately following a meal.  . Omega 3 1000 MG CAPS Take 2,000 mg by mouth daily.  . quinapril (ACCUPRIL) 40 MG tablet TAKE 1 & 1/2 TABLETS ONCE DAILY  . ranitidine (ZANTAC) 150 MG tablet TAKE  (1)  TABLET TWICE A DAY.  . simvastatin (ZOCOR) 20 MG tablet Take 1 tablet (20 mg total) by mouth daily.      New complaints: None today  Social history: Lives by hisself. His wife live sin nursig hime. She is still able to recognize him. He goes over there everyday to see her.   Review of Systems  Constitutional: Negative for activity change and appetite change.  HENT: Negative.   Eyes: Negative for pain.    Respiratory: Negative for shortness of breath.   Cardiovascular: Negative for chest pain, palpitations and leg swelling.  Gastrointestinal: Negative for abdominal pain.  Endocrine: Negative for polydipsia.  Genitourinary: Negative.   Skin: Negative for rash.  Neurological: Negative for dizziness, weakness and headaches.  Hematological: Does not bruise/bleed easily.  Psychiatric/Behavioral: Negative.   All other systems reviewed and are negative.      Objective:   Physical Exam  Constitutional: He is oriented to person, place, and time. He appears well-developed and well-nourished.  HENT:  Head: Normocephalic.  Nose: Nose normal.  Mouth/Throat: Oropharynx is clear and moist.  Eyes: Pupils are equal, round, and reactive to light. EOM are normal.  Neck: Normal range of motion and phonation normal. Neck supple. No JVD present. Carotid bruit is not present. No thyroid mass and no thyromegaly present.  Cardiovascular: Normal rate and regular rhythm.  Pulmonary/Chest: Effort normal and breath sounds normal. No respiratory distress.  Abdominal: Soft. Normal appearance, normal aorta and bowel sounds are normal. There is no tenderness.  Musculoskeletal: Normal range of motion.  Lymphadenopathy:    He has no cervical adenopathy.  Neurological: He is alert and oriented to person, place, and time.  Skin: Skin is warm and dry.  Psychiatric: He has a normal mood and affect. His behavior is normal. Judgment and thought content normal.  Nursing note and vitals reviewed.  BP 132/77   Pulse 75   Temp (!) 96.8 F (36 C) (Oral)   Ht 5' 10"  (1.778 m)   Wt 188 lb (85.3 kg)   BMI 26.98 kg/m        Assessment & Plan:  Frank Cook comes in today with chief complaint of Medical Management of Chronic Issues   Diagnosis and orders addressed:  1. Longstanding persistent atrial fibrillation  2. HYPERTENSION, BENIGN Low sodium diet - hydrochlorothiazide (HYDRODIURIL) 25 MG tablet; Take 1  tablet (25 mg total) by mouth daily.  Dispense: 90 tablet; Refill: 1 - metoprolol succinate (TOPROL-XL) 100 MG 24 hr tablet; Take 1 tablet (100 mg total) by mouth daily. Take with or immediately following a meal.  Dispense: 90 tablet; Refill: 1 - quinapril (ACCUPRIL) 40 MG tablet; TAKE 1 & 1/2 TABLETS ONCE DAILY  Dispense: 135 tablet; Refill: 1 - CMP14+EGFR  3. Gastroesophageal reflux disease without esophagitis Avoid spicy foods Do not eat 2 hours prior to bedtime - ranitidine (ZANTAC) 150 MG tablet; TAKE  (1)  TABLET TWICE A DAY.  Dispense: 180 tablet; Refill: 1  4. Mixed hyperlipidemia Low fat diet - simvastatin (ZOCOR) 20 MG tablet; Take 1 tablet (20 mg total) by mouth daily.  Dispense: 90 tablet; Refill: 1 - Lipid panel  5. Vitamin D deficiency  6. BMI 27.0-27.9,adult Discussed diet and exercise for person with BMI >25 Will recheck weight in 3-6 months   Labs pending Health Maintenance reviewed Diet and exercise encouraged  Follow up plan: 3 months   Mary-Margaret Hassell Done, FNP

## 2018-04-24 LAB — LIPID PANEL
CHOL/HDL RATIO: 2.6 ratio (ref 0.0–5.0)
Cholesterol, Total: 127 mg/dL (ref 100–199)
HDL: 48 mg/dL (ref >39)
LDL Calculated: 55 mg/dL (ref 0–99)
Triglycerides: 119 mg/dL (ref 0–149)
VLDL Cholesterol Cal: 24 mg/dL (ref 5–40)

## 2018-04-24 LAB — CMP14+EGFR
A/G RATIO: 1.6 (ref 1.2–2.2)
ALT: 17 IU/L (ref 0–44)
AST: 27 IU/L (ref 0–40)
Albumin: 4.5 g/dL (ref 3.5–4.7)
Alkaline Phosphatase: 46 IU/L (ref 39–117)
BILIRUBIN TOTAL: 0.6 mg/dL (ref 0.0–1.2)
BUN/Creatinine Ratio: 14 (ref 10–24)
BUN: 22 mg/dL (ref 8–27)
CALCIUM: 10 mg/dL (ref 8.6–10.2)
CO2: 23 mmol/L (ref 20–29)
Chloride: 99 mmol/L (ref 96–106)
Creatinine, Ser: 1.58 mg/dL — ABNORMAL HIGH (ref 0.76–1.27)
GFR, EST AFRICAN AMERICAN: 46 mL/min/{1.73_m2} — AB (ref >59)
GFR, EST NON AFRICAN AMERICAN: 40 mL/min/{1.73_m2} — AB (ref >59)
GLOBULIN, TOTAL: 2.8 g/dL (ref 1.5–4.5)
Glucose: 81 mg/dL (ref 65–99)
POTASSIUM: 4.3 mmol/L (ref 3.5–5.2)
SODIUM: 141 mmol/L (ref 134–144)
Total Protein: 7.3 g/dL (ref 6.0–8.5)

## 2018-07-25 ENCOUNTER — Encounter: Payer: Self-pay | Admitting: Nurse Practitioner

## 2018-07-25 ENCOUNTER — Ambulatory Visit (INDEPENDENT_AMBULATORY_CARE_PROVIDER_SITE_OTHER): Payer: Medicare Other | Admitting: Nurse Practitioner

## 2018-07-25 VITALS — BP 137/85 | HR 81 | Temp 97.6°F | Ht 70.0 in | Wt 194.0 lb

## 2018-07-25 DIAGNOSIS — I4811 Longstanding persistent atrial fibrillation: Secondary | ICD-10-CM | POA: Diagnosis not present

## 2018-07-25 DIAGNOSIS — I10 Essential (primary) hypertension: Secondary | ICD-10-CM

## 2018-07-25 DIAGNOSIS — E559 Vitamin D deficiency, unspecified: Secondary | ICD-10-CM | POA: Diagnosis not present

## 2018-07-25 DIAGNOSIS — K219 Gastro-esophageal reflux disease without esophagitis: Secondary | ICD-10-CM | POA: Diagnosis not present

## 2018-07-25 DIAGNOSIS — E782 Mixed hyperlipidemia: Secondary | ICD-10-CM

## 2018-07-25 DIAGNOSIS — Z6827 Body mass index (BMI) 27.0-27.9, adult: Secondary | ICD-10-CM | POA: Diagnosis not present

## 2018-07-25 MED ORDER — SIMVASTATIN 20 MG PO TABS: 20.0000 mg | ORAL_TABLET | Freq: Every day | ORAL | 1 refills | Status: DC

## 2018-07-25 MED ORDER — METOPROLOL SUCCINATE ER 100 MG PO TB24: 100.0000 mg | ORAL_TABLET | Freq: Every day | ORAL | 1 refills | Status: DC

## 2018-07-25 MED ORDER — APIXABAN 2.5 MG PO TABS: ORAL_TABLET | ORAL | 1 refills | Status: DC

## 2018-07-25 MED ORDER — HYDROCHLOROTHIAZIDE 25 MG PO TABS: 25.0000 mg | ORAL_TABLET | Freq: Every day | ORAL | 1 refills | Status: DC

## 2018-07-25 MED ORDER — QUINAPRIL HCL 40 MG PO TABS: ORAL_TABLET | ORAL | 1 refills | Status: DC

## 2018-07-25 MED ORDER — RANITIDINE HCL 150 MG PO TABS: ORAL_TABLET | ORAL | 1 refills | Status: DC

## 2018-07-25 NOTE — Patient Instructions (Signed)
Fall Prevention in the Home, Adult  Falls can cause injuries. They can happen to people of all ages. There are many things you can do to make your home safe and to help prevent falls. Ask for help when making these changes, if needed.  What actions can I take to prevent falls?  General Instructions  · Use good lighting in all rooms. Replace any light bulbs that burn out.  · Turn on the lights when you go into a dark area. Use night-lights.  · Keep items that you use often in easy-to-reach places. Lower the shelves around your home if necessary.  · Set up your furniture so you have a clear path. Avoid moving your furniture around.  · Do not have throw rugs and other things on the floor that can make you trip.  · Avoid walking on wet floors.  · If any of your floors are uneven, fix them.  · Add color or contrast paint or tape to clearly mark and help you see:  ? Any grab bars or handrails.  ? First and last steps of stairways.  ? Where the edge of each step is.  · If you use a stepladder:  ? Make sure that it is fully opened. Do not climb a closed stepladder.  ? Make sure that both sides of the stepladder are locked into place.  ? Ask someone to hold the stepladder for you while you use it.  · If there are any pets around you, be aware of where they are.  What can I do in the bathroom?         · Keep the floor dry. Clean up any water that spills onto the floor as soon as it happens.  · Remove soap buildup in the tub or shower regularly.  · Use non-skid mats or decals on the floor of the tub or shower.  · Attach bath mats securely with double-sided, non-slip rug tape.  · If you need to sit down in the shower, use a plastic, non-slip stool.  · Install grab bars by the toilet and in the tub and shower. Do not use towel bars as grab bars.  What can I do in the bedroom?  · Make sure that you have a light by your bed that is easy to reach.  · Do not use any sheets or blankets that are too big for your bed. They should  not hang down onto the floor.  · Have a firm chair that has side arms. You can use this for support while you get dressed.  What can I do in the kitchen?  · Clean up any spills right away.  · If you need to reach something above you, use a strong step stool that has a grab bar.  · Keep electrical cords out of the way.  · Do not use floor polish or wax that makes floors slippery. If you must use wax, use non-skid floor wax.  What can I do with my stairs?  · Do not leave any items on the stairs.  · Make sure that you have a light switch at the top of the stairs and the bottom of the stairs. If you do not have them, ask someone to add them for you.  · Make sure that there are handrails on both sides of the stairs, and use them. Fix handrails that are broken or loose. Make sure that handrails are as long as the stairways.  ·   Install non-slip stair treads on all stairs in your home.  · Avoid having throw rugs at the top or bottom of the stairs. If you do have throw rugs, attach them to the floor with carpet tape.  · Choose a carpet that does not hide the edge of the steps on the stairway.  · Check any carpeting to make sure that it is firmly attached to the stairs. Fix any carpet that is loose or worn.  What can I do on the outside of my home?  · Use bright outdoor lighting.  · Regularly fix the edges of walkways and driveways and fix any cracks.  · Remove anything that might make you trip as you walk through a door, such as a raised step or threshold.  · Trim any bushes or trees on the path to your home.  · Regularly check to see if handrails are loose or broken. Make sure that both sides of any steps have handrails.  · Install guardrails along the edges of any raised decks and porches.  · Clear walking paths of anything that might make someone trip, such as tools or rocks.  · Have any leaves, snow, or ice cleared regularly.  · Use sand or salt on walking paths during winter.  · Clean up any spills in your garage right  away. This includes grease or oil spills.  What other actions can I take?  · Wear shoes that:  ? Have a low heel. Do not wear high heels.  ? Have rubber bottoms.  ? Are comfortable and fit you well.  ? Are closed at the toe. Do not wear open-toe sandals.  · Use tools that help you move around (mobility aids) if they are needed. These include:  ? Canes.  ? Walkers.  ? Scooters.  ? Crutches.  · Review your medicines with your doctor. Some medicines can make you feel dizzy. This can increase your chance of falling.  Ask your doctor what other things you can do to help prevent falls.  Where to find more information  · Centers for Disease Control and Prevention, [employer]: https://cdc.gov  · National Institute on Aging: https://go4life.nia.nih.gov  Contact a doctor if:  · You are afraid of falling at home.  · You feel weak, drowsy, or dizzy at home.  · You fall at home.  Summary  · There are many simple things that you can do to make your home safe and to help prevent falls.  · Ways to make your home safe include removing tripping hazards and installing grab bars in the bathroom.  · Ask for help when making these changes in your home.  This information is not intended to replace advice given to you by your health care provider. Make sure you discuss any questions you have with your health care provider.  Document Released: 03/18/2009 Document Revised: 01/04/2017 Document Reviewed: 01/04/2017  [employer] Interactive Patient Education © 2019 [employer]

## 2018-07-25 NOTE — Progress Notes (Signed)
Subjective:    Patient ID: Frank Cook, male    DOB: 10-26-1931, 85 y.o.   MRN: [id number]   Chief Complaint: Medical Management of Chronic Issues   HPI:  1. HYPERTENSION, BENIGN   no c/o chest pain, sob or headache.  Does not check blood pressure at home. BP Readings from Last 3 Encounters:  07/25/18 137/85  04/23/18 132/77  01/17/18 128/78     2. Mixed hyperlipidemia  Does not watch diet and does no exercise. Has problems with his gait.  3. Gastroesophageal reflux disease without esophagitis  Takes zantac daily  4. Longstanding persistent atrial fibrillation  Is on eliquis and has no bleeding  5. Vitamin D deficiency  Takes daily vitamin d supplement  6. BMI 27.0-27.9,adult  No recent weight chnages    Outpatient Encounter Medications as of 07/25/2018  Medication Sig  . apixaban (ELIQUIS) 2.5 MG TABS tablet Take 1 tablet (2.5 mg total) by mouth 2 (two) times daily.  . Cholecalciferol (VITAMIN D3) 5000 units CAPS Take 5,000 Units by mouth daily.  . Glucosamine 500 MG CAPS Take 1,500 mg by mouth daily.  . hydrochlorothiazide (HYDRODIURIL) 25 MG tablet Take 1 tablet (25 mg total) by mouth daily.  . metoprolol succinate (TOPROL-XL) 100 MG 24 hr tablet Take 1 tablet (100 mg total) by mouth daily. Take with or immediately following a meal.  . Omega 3 1000 MG CAPS Take 2,000 mg by mouth daily.  . quinapril (ACCUPRIL) 40 MG tablet TAKE 1 & 1/2 TABLETS ONCE DAILY  . ranitidine (ZANTAC) 150 MG tablet TAKE  (1)  TABLET TWICE A DAY.  . simvastatin (ZOCOR) 20 MG tablet Take 1 tablet (20 mg total) by mouth daily.       New complaints: None today  Social history: Wife is still in nursing home and he tries to go see her everyday.   Review of Systems  Constitutional: Negative for activity change and appetite change.  HENT: Negative.   Eyes: Negative for pain.  Respiratory: Negative for shortness of breath.   Cardiovascular: Negative for chest pain, palpitations and leg  swelling.  Gastrointestinal: Negative for abdominal pain.  Endocrine: Negative for polydipsia.  Genitourinary: Negative.   Skin: Negative for rash.  Neurological: Negative for dizziness, weakness and headaches.  Hematological: Does not bruise/bleed easily.  Psychiatric/Behavioral: Negative.   All other systems reviewed and are negative.      Objective:   Physical Exam Vitals signs and nursing note reviewed.  Constitutional:      Appearance: Normal appearance. He is well-developed.  HENT:     Head: Normocephalic.     Nose: Nose normal.  Eyes:     Pupils: Pupils are equal, round, and reactive to light.  Neck:     Musculoskeletal: Normal range of motion and neck supple.     Thyroid: No thyroid mass or thyromegaly.     Vascular: No carotid bruit or JVD.     Trachea: Phonation normal.  Cardiovascular:     Rate and Rhythm: Normal rate and regular rhythm.  Pulmonary:     Effort: Pulmonary effort is normal. No respiratory distress.     Breath sounds: Normal breath sounds.  Abdominal:     General: Bowel sounds are normal.     Palpations: Abdomen is soft.     Tenderness: There is no abdominal tenderness.  Musculoskeletal: Normal range of motion.     Comments: Gait slow and steady  Lymphadenopathy:     Cervical: No cervical adenopathy.  Skin:    General: Skin is warm and dry.  Neurological:     Mental Status: He is alert and oriented to person, place, and time.  Psychiatric:        Behavior: Behavior normal.        Thought Content: Thought content normal.        Judgment: Judgment normal.    BP 137/85   Pulse 81   Temp 97.6 F (36.4 C) (Oral)   Ht 5' 10" (1.778 m)   Wt 194 lb (88 kg)   BMI 27.84 kg/m         Assessment & Plan:  Frank Cook comes in today with chief complaint of Medical Management of Chronic Issues   Diagnosis and orders addressed:  1. HYPERTENSION, BENIGN Low sodium diet - hydrochlorothiazide (HYDRODIURIL) 25 MG tablet; Take 1 tablet (25  mg total) by mouth daily.  Dispense: 90 tablet; Refill: 1 - metoprolol succinate (TOPROL-XL) 100 MG 24 hr tablet; Take 1 tablet (100 mg total) by mouth daily. Take with or immediately following a meal.  Dispense: 90 tablet; Refill: 1 - quinapril (ACCUPRIL) 40 MG tablet; TAKE 1 & 1/2 TABLETS ONCE DAILY  Dispense: 135 tablet; Refill: 1 - CMP14+EGFR  2. Mixed hyperlipidemia Low fat diet - simvastatin (ZOCOR) 20 MG tablet; Take 1 tablet (20 mg total) by mouth daily.  Dispense: 90 tablet; Refill: 1 - Lipid panel  3. Gastroesophageal reflux disease without esophagitis Avoid spicy foods Do not eat 2 hours prior to bedtime - ranitidine (ZANTAC) 150 MG tablet; TAKE  (1)  TABLET TWICE A DAY.  Dispense: 180 tablet; Refill: 1  4. Longstanding persistent atrial fibrillation Continue eliquis as rx  5. Vitamin D deficiency Daily vitamin d supplement  6. BMI 27.0-27.9,adult Discussed diet and exercise for person with BMI >25 Will recheck weight in 3-6 months   Labs pending Health Maintenance reviewed Diet and exercise encouraged  Follow up plan: 3 months   Mary-Margaret Hassell Done, FNP

## 2018-07-26 LAB — CMP14+EGFR
ALK PHOS: 46 IU/L (ref 39–117)
ALT: 18 IU/L (ref 0–44)
AST: 24 IU/L (ref 0–40)
Albumin/Globulin Ratio: 1.6 (ref 1.2–2.2)
Albumin: 4.4 g/dL (ref 3.6–4.6)
BUN/Creatinine Ratio: 13 (ref 10–24)
BUN: 20 mg/dL (ref 8–27)
Bilirubin Total: 0.4 mg/dL (ref 0.0–1.2)
CHLORIDE: 101 mmol/L (ref 96–106)
CO2: 25 mmol/L (ref 20–29)
Calcium: 10 mg/dL (ref 8.6–10.2)
Creatinine, Ser: 1.54 mg/dL — ABNORMAL HIGH (ref 0.76–1.27)
GFR calc Af Amer: 47 mL/min/{1.73_m2} — ABNORMAL LOW (ref >59)
GFR calc non Af Amer: 41 mL/min/{1.73_m2} — ABNORMAL LOW (ref >59)
Globulin, Total: 2.7 g/dL (ref 1.5–4.5)
Glucose: 99 mg/dL (ref 65–99)
Potassium: 4.4 mmol/L (ref 3.5–5.2)
Sodium: 141 mmol/L (ref 134–144)
Total Protein: 7.1 g/dL (ref 6.0–8.5)

## 2018-07-26 LAB — LIPID PANEL
Chol/HDL Ratio: 2.7 ratio (ref 0.0–5.0)
Cholesterol, Total: 138 mg/dL (ref 100–199)
HDL: 51 mg/dL (ref >39)
LDL Calculated: 69 mg/dL (ref 0–99)
Triglycerides: 92 mg/dL (ref 0–149)
VLDL Cholesterol Cal: 18 mg/dL (ref 5–40)

## 2018-07-31 ENCOUNTER — Ambulatory Visit (INDEPENDENT_AMBULATORY_CARE_PROVIDER_SITE_OTHER): Payer: Medicare Other | Admitting: *Deleted

## 2018-07-31 ENCOUNTER — Encounter: Payer: Self-pay | Admitting: *Deleted

## 2018-07-31 VITALS — BP 123/84 | HR 86 | Ht 70.0 in | Wt 194.0 lb

## 2018-07-31 DIAGNOSIS — Z0000 Encounter for general adult medical examination without abnormal findings: Secondary | ICD-10-CM

## 2018-07-31 NOTE — Patient Instructions (Addendum)
Please work on your goal of exercising 3 times per week for about 30 minutes each session.   Please get your Shingrix ( Shingles vaccine) in the future.   Please continue to move carefully to avoid falls, and use your cane as needed.  Thank you for coming in for your Annual Wellness Visit today!!  Preventive Care 65 Years and Older, Male Preventive care refers to lifestyle choices and visits with your health care provider that can promote health and wellness. What does preventive care include?   A yearly physical exam. This is also called an annual well check.  Dental exams once or twice a year.  Routine eye exams. Ask your health care provider how often you should have your eyes checked.  Personal lifestyle choices, including: ? Daily care of your teeth and gums. ? Regular physical activity. ? Eating a healthy diet. ? Avoiding tobacco and drug use. ? Limiting alcohol use. ? Practicing safe sex. ? Taking low doses of aspirin every day. ? Taking vitamin and mineral supplements as recommended by your health care provider. What happens during an annual well check? The services and screenings done by your health care provider during your annual well check will depend on your age, overall health, lifestyle risk factors, and family history of disease. Counseling Your health care provider may ask you questions about your:  Alcohol use.  Tobacco use.  Drug use.  Emotional well-being.  Home and relationship well-being.  Sexual activity.  Eating habits.  History of falls.  Memory and ability to understand (cognition).  Work and work Statistician. Screening You may have the following tests or measurements:  Height, weight, and BMI.  Blood pressure.  Lipid and cholesterol levels. These may be checked every 5 years, or more frequently if you are over 7 years old.  Skin check.  Lung cancer screening. You may have this screening every year starting at age 44 if you  have a 30-pack-year history of smoking and currently smoke or have quit within the past 15 years.  Colorectal cancer screening. All adults should have this screening starting at age 47 and continuing until age 102. You will have tests every 1-10 years, depending on your results and the type of screening test. People at increased risk should start screening at an earlier age. Screening tests may include: ? Guaiac-based fecal occult blood testing. ? Fecal immunochemical test (FIT). ? Stool DNA test. ? Virtual colonoscopy. ? Sigmoidoscopy. During this test, a flexible tube with a tiny camera (sigmoidoscope) is used to examine your rectum and lower colon. The sigmoidoscope is inserted through your anus into your rectum and lower colon. ? Colonoscopy. During this test, a long, thin, flexible tube with a tiny camera (colonoscope) is used to examine your entire colon and rectum.  Prostate cancer screening. Recommendations will vary depending on your family history and other risks.  Hepatitis C blood test.  Hepatitis B blood test.  Sexually transmitted disease (STD) testing.  Diabetes screening. This is done by checking your blood sugar (glucose) after you have not eaten for a while (fasting). You may have this done every 1-3 years.  Abdominal aortic aneurysm (AAA) screening. You may need this if you are a current or former smoker.  Osteoporosis. You may be screened starting at age 70 if you are at high risk. Talk with your health care provider about your test results, treatment options, and if necessary, the need for more tests. Vaccines Your health care provider may recommend certain vaccines,  such as:  Influenza vaccine. This is recommended every year.  Tetanus, diphtheria, and acellular pertussis (Tdap, Td) vaccine. You may need a Td booster every 10 years.  Varicella vaccine. You may need this if you have not been vaccinated.  Zoster vaccine. You may need this after age 1.  Measles,  mumps, and rubella (MMR) vaccine. You may need at least one dose of MMR if you were born in 1957 or later. You may also need a second dose.  Pneumococcal 13-valent conjugate (PCV13) vaccine. One dose is recommended after age 83.  Pneumococcal polysaccharide (PPSV23) vaccine. One dose is recommended after age 83.  Meningococcal vaccine. You may need this if you have certain conditions.  Hepatitis A vaccine. You may need this if you have certain conditions or if you travel or work in places where you may be exposed to hepatitis A.  Hepatitis B vaccine. You may need this if you have certain conditions or if you travel or work in places where you may be exposed to hepatitis B.  Haemophilus influenzae type b (Hib) vaccine. You may need this if you have certain risk factors. Talk to your health care provider about which screenings and vaccines you need and how often you need them. This information is not intended to replace advice given to you by your health care provider. Make sure you discuss any questions you have with your health care provider. Document Released: 06/18/2015 Document Revised: 07/12/2017 Document Reviewed: 03/23/2015 Elsevier Interactive Patient Education  2019 [employer].

## 2018-08-01 ENCOUNTER — Encounter: Payer: Self-pay | Admitting: *Deleted

## 2018-08-01 NOTE — Progress Notes (Addendum)
Subjective:   Frank Cook is a 85 y.o. male who presents for a subsequent Medicare Annual Wellness Visit.  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  He enjoys watching racing on television.  His wife is in the nursing home, and he visits her daily.  He lives alone and does not have children.   Patient Care Team: Bennie Pierini, FNP as PCP - General (Nurse Practitioner) Rollene Rotunda, MD as Consulting Physician (Cardiology)  Hospitalizations, surgeries, and ER visits in previous 12 months No hospitalizations, ER visits, or surgeries this past year.   Review of Systems    Patient reports that his overall health is unchanged compared to last year.  Cardiac Risk Factors include: dyslipidemia;hypertension;male gender;sedentary lifestyle   All other systems negative       Current Medications (verified) Outpatient Encounter Medications as of 07/31/2018  Medication Sig  . apixaban (ELIQUIS) 2.5 MG TABS tablet Take 1 tablet (2.5 mg total) by mouth 2 (two) times daily.  . Cholecalciferol (VITAMIN D3) 5000 units CAPS Take 5,000 Units by mouth daily.  . Glucosamine 500 MG CAPS Take 1,500 mg by mouth daily.  . hydrochlorothiazide (HYDRODIURIL) 25 MG tablet Take 1 tablet (25 mg total) by mouth daily.  . metoprolol succinate (TOPROL-XL) 100 MG 24 hr tablet Take 1 tablet (100 mg total) by mouth daily. Take with or immediately following a meal.  . Omega 3 1000 MG CAPS Take 2,000 mg by mouth daily.  . quinapril (ACCUPRIL) 40 MG tablet TAKE 1 & 1/2 TABLETS ONCE DAILY  . ranitidine (ZANTAC) 150 MG tablet TAKE  (1)  TABLET TWICE A DAY.  . simvastatin (ZOCOR) 20 MG tablet Take 1 tablet (20 mg total) by mouth daily.   No facility-administered encounter medications on file as of 07/31/2018.     Allergies (verified) Patient has no known allergies.   History: Past Medical History:  Diagnosis Date  . Atrial fibrillation (HCC)   . Colon polyps   . GERD  (gastroesophageal reflux disease)   . Hyperlipidemia   . Hypertension   . Osteopenia    Past Surgical History:  Procedure Laterality Date  . None     Family History  Problem Relation Age of Onset  . COPD Mother   . Cancer Father        Bone  . Cancer Brother        Bone  . Arthritis Brother   . Hypertension Brother    Social History   Socioeconomic History  . Marital status: Married    Spouse name: Not on file  . Number of children: 0  . Years of education: Not on file  . Highest education level: High school graduate  Occupational History  . Occupation: Retired    Associate Professor: [employer]  Social Needs  . Financial resource strain: Not hard at all  . Food insecurity:    Worry: Never true    Inability: Never true  . Transportation needs:    Medical: No    Non-medical: No  Tobacco Use  . Smoking status: Never Smoker  . Smokeless tobacco: Never Used  . Tobacco comment: Never smoker   Substance and Sexual Activity  . Alcohol use: No  . Drug use: No  . Sexual activity: Not on file  Lifestyle  . Physical activity:    Days per week: 0 days    Minutes per session: 0 min  . Stress: Not at all  Relationships  . Social connections:  Talks on phone: More than three times a week    Gets together: More than three times a week    Attends religious service: Never    Active member of club or organization: No    Attends meetings of clubs or organizations: Never    Relationship status: Married  Other Topics Concern  . Not on file  Social History Narrative   Lives alone.  Wife is now in nursing home.     Clinical Intake:     Pain Score: 0-No pain                  Activities of Daily Living In your present state of health, do you have any difficulty performing the following activities: 07/31/2018  Hearing? N  Vision? N  Difficulty concentrating or making decisions? N  Walking or climbing stairs? Y  Comment Due to balance, uses cane as needed  Dressing or  bathing? N  Doing errands, shopping? N  Preparing Food and eating ? N  Using the Toilet? N  In the past six months, have you accidently leaked urine? N  Do you have problems with loss of bowel control? N  Managing your Medications? N  Managing your Finances? N  Housekeeping or managing your Housekeeping? N  Some recent data might be hidden     Exercise Current Exercise Habits: The patient does not participate in regular exercise at present  Diet Consumes 3 meals a day and 0 snacks a day.  The patient feels that he mostly follow a Regular diet.  Diet History  Usually has cereal for breakfast, and vegetables for lunch and supper.  Does not eat a significant amount of meat as he does not enjoy it.  Recommended a diet of mostly non-starchy vegetables, fruits, whole grains, and lean proteins when he does eat meat.  Patient has access to all the food he needs.   Depression Screen PHQ 2/9 Scores 07/31/2018 07/25/2018 04/23/2018 01/17/2018 10/11/2017 07/26/2017 07/13/2017  PHQ - 2 Score 0 0 0 0 0 0 0     Fall Risk Fall Risk  07/31/2018 07/25/2018 04/23/2018 01/17/2018 10/11/2017  Falls in the past year? 0 0 0 Yes Yes  Number falls in past yr: - - - 1 1  Injury with Fall? - - - No No  Comment - - - - -     Objective:    Today's Vitals   07/31/18 1119  BP: 123/84  Pulse: 86  Weight: 194 lb (88 kg)  Height: 5\' 10"  (1.778 m)  PainSc: 0-No pain   Body mass index is 27.84 kg/m.  Advanced Directives 07/31/2018 07/26/2017 07/19/2016  Does Patient Have a Medical Advance Directive? Yes Yes Yes  Type of Advance Directive Living will Healthcare Power of [place];Living will Healthcare Power of [place];Living will  Does patient want to make changes to medical advance directive? No - Patient declined - No - Patient declined  Copy of Healthcare Power of Attorney in Chart? - Yes Yes    Hearing/Vision  No hearing or vision deficits noted during visit.  Cognitive Function: MMSE - Mini Mental  State Exam 07/31/2018 07/26/2017 07/19/2016  Orientation to time 5 5 5   Orientation to Place 5 5 5   Registration 3 3 3   Attention/ Calculation 5 5 4   Recall 2 3 1   Language- name 2 objects 2 2 2   Language- repeat 1 1 1   Language- follow 3 step command 3 3 3   Language- read & follow direction 1  1 1  Write a sentence 1 1 1   Copy design 1 1 1   Total score 29 30 27           Immunizations and Health Maintenance Immunization History  Administered Date(s) Administered  . Influenza, High Dose Seasonal PF 04/03/2016, 04/02/2017  . Influenza,inj,Quad PF,6+ Mos 02/24/2013, 03/16/2014, 03/29/2015  . Influenza-Unspecified 03/13/2018  . Pneumococcal Conjugate-13 09/17/2014  . Pneumococcal Polysaccharide-23 06/06/1999  . Tdap 03/06/2011  . Zoster 04/06/2007   Shingrix declined today  There are no preventive care reminders to display for this patient. Health Maintenance  Topic Date Due  . TETANUS/TDAP  03/14/2021  . INFLUENZA VACCINE  Completed  . PNA vac Low Risk Adult  Completed        Assessment:   This is a routine wellness examination for Stony.    Plan:    Goals    . Exercise 3x per week (30 min per time)        Health Maintenance & Additional Screening Recommendations: Advanced directives: has an advanced directive - a copy HAS NOT been provided.  Lung: Low Dose CT Chest recommended if Age 52-80 years, 30 pack-year currently smoking OR have quit w/in 15years. Patient does not qualify. Hepatitis C Screening recommended: not applicable     Keep f/u with Bennie Pierini, FNP and any other specialty appointments you may have Continue current medications Move carefully to avoid falls. Use assistive devices like a cane or walker if needed. Aim for at least 150 minutes of moderate activity a week.  Read or work on puzzles daily Stay connected with friends and family  I have personally reviewed and noted the following in the patient's chart:   . Medical and  social history . Use of alcohol, tobacco or illicit drugs  . Current medications and supplements . Functional ability and status . Nutritional status . Physical activity . Advanced directives . List of other physicians . Hospitalizations, surgeries, and ER visits in previous 12 months . Vitals . Screenings to include cognitive, depression, and falls . Referrals and appointments  In addition, I have reviewed and discussed with patient certain preventive protocols, quality metrics, and best practice recommendations. A written personalized care plan for preventive services as well as general preventive health recommendations were provided to patient.     Lilia Argue, RN  08/01/2018   I have reviewed and agree with the above AWV documentation.   Mary-Margaret Daphine Deutscher, FNP

## 2018-10-23 ENCOUNTER — Other Ambulatory Visit: Payer: Self-pay

## 2018-10-24 ENCOUNTER — Other Ambulatory Visit: Payer: Self-pay

## 2018-10-24 ENCOUNTER — Encounter: Payer: Self-pay | Admitting: Nurse Practitioner

## 2018-10-24 ENCOUNTER — Ambulatory Visit (INDEPENDENT_AMBULATORY_CARE_PROVIDER_SITE_OTHER): Payer: Medicare Other | Admitting: Nurse Practitioner

## 2018-10-24 VITALS — BP 157/88 | HR 80 | Temp 96.8°F | Ht 70.0 in | Wt 192.0 lb

## 2018-10-24 DIAGNOSIS — E559 Vitamin D deficiency, unspecified: Secondary | ICD-10-CM | POA: Diagnosis not present

## 2018-10-24 DIAGNOSIS — K219 Gastro-esophageal reflux disease without esophagitis: Secondary | ICD-10-CM | POA: Diagnosis not present

## 2018-10-24 DIAGNOSIS — E782 Mixed hyperlipidemia: Secondary | ICD-10-CM

## 2018-10-24 DIAGNOSIS — I10 Essential (primary) hypertension: Secondary | ICD-10-CM | POA: Diagnosis not present

## 2018-10-24 DIAGNOSIS — I4811 Longstanding persistent atrial fibrillation: Secondary | ICD-10-CM | POA: Diagnosis not present

## 2018-10-24 DIAGNOSIS — Z6827 Body mass index (BMI) 27.0-27.9, adult: Secondary | ICD-10-CM

## 2018-10-24 MED ORDER — METOPROLOL SUCCINATE ER 100 MG PO TB24: 100.0000 mg | ORAL_TABLET | Freq: Every day | ORAL | 1 refills | Status: DC

## 2018-10-24 MED ORDER — OMEPRAZOLE 20 MG PO CPDR: 20.0000 mg | DELAYED_RELEASE_CAPSULE | Freq: Every day | ORAL | 1 refills | Status: DC

## 2018-10-24 MED ORDER — APIXABAN 2.5 MG PO TABS: ORAL_TABLET | ORAL | 1 refills | Status: DC

## 2018-10-24 MED ORDER — HYDROCHLOROTHIAZIDE 25 MG PO TABS: 25.0000 mg | ORAL_TABLET | Freq: Every day | ORAL | 1 refills | Status: DC

## 2018-10-24 MED ORDER — SIMVASTATIN 20 MG PO TABS: 20.0000 mg | ORAL_TABLET | Freq: Every day | ORAL | 1 refills | Status: DC

## 2018-10-24 MED ORDER — QUINAPRIL HCL 40 MG PO TABS: ORAL_TABLET | ORAL | 1 refills | Status: DC

## 2018-10-24 NOTE — Patient Instructions (Signed)
Fall Prevention in the Home, Adult  Falls can cause injuries. They can happen to people of all ages. There are many things you can do to make your home safe and to help prevent falls. Ask for help when making these changes, if needed.  What actions can I take to prevent falls?  General Instructions  · Use good lighting in all rooms. Replace any light bulbs that burn out.  · Turn on the lights when you go into a dark area. Use night-lights.  · Keep items that you use often in easy-to-reach places. Lower the shelves around your home if necessary.  · Set up your furniture so you have a clear path. Avoid moving your furniture around.  · Do not have throw rugs and other things on the floor that can make you trip.  · Avoid walking on wet floors.  · If any of your floors are uneven, fix them.  · Add color or contrast paint or tape to clearly mark and help you see:  ? Any grab bars or handrails.  ? First and last steps of stairways.  ? Where the edge of each step is.  · If you use a stepladder:  ? Make sure that it is fully opened. Do not climb a closed stepladder.  ? Make sure that both sides of the stepladder are locked into place.  ? Ask someone to hold the stepladder for you while you use it.  · If there are any pets around you, be aware of where they are.  What can I do in the bathroom?         · Keep the floor dry. Clean up any water that spills onto the floor as soon as it happens.  · Remove soap buildup in the tub or shower regularly.  · Use non-skid mats or decals on the floor of the tub or shower.  · Attach bath mats securely with double-sided, non-slip rug tape.  · If you need to sit down in the shower, use a plastic, non-slip stool.  · Install grab bars by the toilet and in the tub and shower. Do not use towel bars as grab bars.  What can I do in the bedroom?  · Make sure that you have a light by your bed that is easy to reach.  · Do not use any sheets or blankets that are too big for your bed. They should  not hang down onto the floor.  · Have a firm chair that has side arms. You can use this for support while you get dressed.  What can I do in the kitchen?  · Clean up any spills right away.  · If you need to reach something above you, use a strong step stool that has a grab bar.  · Keep electrical cords out of the way.  · Do not use floor polish or wax that makes floors slippery. If you must use wax, use non-skid floor wax.  What can I do with my stairs?  · Do not leave any items on the stairs.  · Make sure that you have a light switch at the top of the stairs and the bottom of the stairs. If you do not have them, ask someone to add them for you.  · Make sure that there are handrails on both sides of the stairs, and use them. Fix handrails that are broken or loose. Make sure that handrails are as long as the stairways.  ·   Install non-slip stair treads on all stairs in your home.  · Avoid having throw rugs at the top or bottom of the stairs. If you do have throw rugs, attach them to the floor with carpet tape.  · Choose a carpet that does not hide the edge of the steps on the stairway.  · Check any carpeting to make sure that it is firmly attached to the stairs. Fix any carpet that is loose or worn.  What can I do on the outside of my home?  · Use bright outdoor lighting.  · Regularly fix the edges of walkways and driveways and fix any cracks.  · Remove anything that might make you trip as you walk through a door, such as a raised step or threshold.  · Trim any bushes or trees on the path to your home.  · Regularly check to see if handrails are loose or broken. Make sure that both sides of any steps have handrails.  · Install guardrails along the edges of any raised decks and porches.  · Clear walking paths of anything that might make someone trip, such as tools or rocks.  · Have any leaves, snow, or ice cleared regularly.  · Use sand or salt on walking paths during winter.  · Clean up any spills in your garage right  away. This includes grease or oil spills.  What other actions can I take?  · Wear shoes that:  ? Have a low heel. Do not wear high heels.  ? Have rubber bottoms.  ? Are comfortable and fit you well.  ? Are closed at the toe. Do not wear open-toe sandals.  · Use tools that help you move around (mobility aids) if they are needed. These include:  ? Canes.  ? Walkers.  ? Scooters.  ? Crutches.  · Review your medicines with your doctor. Some medicines can make you feel dizzy. This can increase your chance of falling.  Ask your doctor what other things you can do to help prevent falls.  Where to find more information  · Centers for Disease Control and Prevention, [employer]: https://cdc.gov  · National Institute on Aging: https://go4life.nia.nih.gov  Contact a doctor if:  · You are afraid of falling at home.  · You feel weak, drowsy, or dizzy at home.  · You fall at home.  Summary  · There are many simple things that you can do to make your home safe and to help prevent falls.  · Ways to make your home safe include removing tripping hazards and installing grab bars in the bathroom.  · Ask for help when making these changes in your home.  This information is not intended to replace advice given to you by your health care provider. Make sure you discuss any questions you have with your health care provider.  Document Released: 03/18/2009 Document Revised: 01/04/2017 Document Reviewed: 01/04/2017  [employer] Interactive Patient Education © 2019 [employer]

## 2018-10-24 NOTE — Progress Notes (Signed)
Subjective:    Patient ID: Frank Cook, male    DOB: 1931/09/07, 85 y.o.   MRN: [id number]   Chief Complaint: Medical Management of Chronic Issues    HPI:  1. HYPERTENSION, BENIGN No c/o chest pain, sob or headache. Does not check blood pressure at home. BP Readings from Last 3 Encounters:  10/24/18 (!) 157/88  07/31/18 123/84  07/25/18 137/85     2. Mixed hyperlipidemia Does not watch diet and does no exercise.  3. Longstanding persistent atrial fibrillation He is currently on eliquis with no bleeding  4. Gastroesophageal reflux disease without esophagitis Was on zantac but has been taken off the market. He needs something to take its place.  5. Vitamin D deficiency Takes a weekly vitamin d suplement  6. BMI 27.0-27.9,adult No recent weight changes    Outpatient Encounter Medications as of 10/24/2018  Medication Sig  . apixaban (ELIQUIS) 2.5 MG TABS tablet Take 1 tablet (2.5 mg total) by mouth 2 (two) times daily.  . Cholecalciferol (VITAMIN D3) 5000 units CAPS Take 5,000 Units by mouth daily.  . Glucosamine 500 MG CAPS Take 1,500 mg by mouth daily.  . hydrochlorothiazide (HYDRODIURIL) 25 MG tablet Take 1 tablet (25 mg total) by mouth daily.  . metoprolol succinate (TOPROL-XL) 100 MG 24 hr tablet Take 1 tablet (100 mg total) by mouth daily. Take with or immediately following a meal.  . Omega 3 1000 MG CAPS Take 2,000 mg by mouth daily.  . quinapril (ACCUPRIL) 40 MG tablet TAKE 1 & 1/2 TABLETS ONCE DAILY  . ranitidine (ZANTAC) 150 MG tablet TAKE  (1)  TABLET TWICE A DAY.  . simvastatin (ZOCOR) 20 MG tablet Take 1 tablet (20 mg total) by mouth daily.      New complaints: None today  Social history: His wife is in a nursing home. He has not seen her since July 17, 2018. He has faced timed her several times but that is all. He is a little anxious about this.      Review of Systems  Constitutional: Negative for activity change and appetite change.   HENT: Negative.   Eyes: Negative for pain.  Respiratory: Negative for shortness of breath.   Cardiovascular: Negative for chest pain, palpitations and leg swelling.  Gastrointestinal: Negative for abdominal pain.  Endocrine: Negative for polydipsia.  Genitourinary: Negative.   Musculoskeletal: Positive for gait problem.  Skin: Negative for rash.  Neurological: Negative for dizziness, weakness and headaches.  Hematological: Does not bruise/bleed easily.  Psychiatric/Behavioral: Negative.   All other systems reviewed and are negative.      Objective:   Physical Exam Vitals signs and nursing note reviewed.  Constitutional:      Appearance: Normal appearance. He is well-developed.  HENT:     Head: Normocephalic.     Nose: Nose normal.  Eyes:     Pupils: Pupils are equal, round, and reactive to light.  Neck:     Musculoskeletal: Normal range of motion and neck supple.     Thyroid: No thyroid mass or thyromegaly.     Vascular: No carotid bruit or JVD.     Trachea: Phonation normal.  Cardiovascular:     Rate and Rhythm: Normal rate and regular rhythm.     Heart sounds: Normal heart sounds.  Pulmonary:     Effort: Pulmonary effort is normal. No respiratory distress.     Breath sounds: Normal breath sounds.  Abdominal:     General: Bowel sounds are normal.  Palpations: Abdomen is soft.     Tenderness: There is no abdominal tenderness.  Musculoskeletal: Normal range of motion.  Lymphadenopathy:     Cervical: No cervical adenopathy.  Skin:    General: Skin is warm and dry.  Neurological:     Mental Status: He is alert and oriented to person, place, and time.  Psychiatric:        Behavior: Behavior normal.        Thought Content: Thought content normal.        Judgment: Judgment normal.    BP (!) 157/88   Pulse 80   Temp (!) 96.8 F (36 C) (Oral)   Ht 5\' 10"  (1.778 m)   Wt 192 lb (87.1 kg)   BMI 27.55 kg/m         Assessment & Plan:  Frank Cook comes in  today with chief complaint of Medical Management of Chronic Issues   Diagnosis and orders addressed:  1. HYPERTENSION, BENIGN Low sodium diet - hydrochlorothiazide (HYDRODIURIL) 25 MG tablet; Take 1 tablet (25 mg total) by mouth daily.  Dispense: 90 tablet; Refill: 1 - metoprolol succinate (TOPROL-XL) 100 MG 24 hr tablet; Take 1 tablet (100 mg total) by mouth daily. Take with or immediately following a meal.  Dispense: 90 tablet; Refill: 1 - quinapril (ACCUPRIL) 40 MG tablet; TAKE 1 & 1/2 TABLETS ONCE DAILY  Dispense: 135 tablet; Refill: 1  2. Mixed hyperlipidemia Low fat diet - simvastatin (ZOCOR) 20 MG tablet; Take 1 tablet (20 mg total) by mouth daily.  Dispense: 90 tablet; Refill: 1  3. Longstanding persistent atrial fibrillation Avoid caffeine  4. Gastroesophageal reflux disease without esophagitis Avoid spicy foods Do not eat 2 hours prior to bedtime  5. Vitamin D deficiency Continue vitamin d supplement  6. BMI 27.0-27.9,adult Discussed diet and exercise for person with BMI >25 Will recheck weight in 3-6 months   Labs pending Health Maintenance reviewed Diet and exercise encouraged  Follow up plan: 3 months   Mary-Margaret Daphine Deutscher, FNP

## 2018-10-25 LAB — CMP14+EGFR
ALT: 18 IU/L (ref 0–44)
AST: 28 IU/L (ref 0–40)
Albumin/Globulin Ratio: 1.7 (ref 1.2–2.2)
Albumin: 4.5 g/dL (ref 3.6–4.6)
Alkaline Phosphatase: 46 IU/L (ref 39–117)
BUN/Creatinine Ratio: 13 (ref 10–24)
BUN: 17 mg/dL (ref 8–27)
Bilirubin Total: 0.6 mg/dL (ref 0.0–1.2)
CO2: 24 mmol/L (ref 20–29)
Calcium: 10 mg/dL (ref 8.6–10.2)
Chloride: 99 mmol/L (ref 96–106)
Creatinine, Ser: 1.36 mg/dL — ABNORMAL HIGH (ref 0.76–1.27)
GFR calc Af Amer: 55 mL/min/{1.73_m2} — ABNORMAL LOW (ref >59)
GFR calc non Af Amer: 47 mL/min/{1.73_m2} — ABNORMAL LOW (ref >59)
Globulin, Total: 2.7 g/dL (ref 1.5–4.5)
Glucose: 93 mg/dL (ref 65–99)
Potassium: 4.4 mmol/L (ref 3.5–5.2)
Sodium: 139 mmol/L (ref 134–144)
Total Protein: 7.2 g/dL (ref 6.0–8.5)

## 2018-10-25 LAB — LIPID PANEL
Chol/HDL Ratio: 2.7 ratio (ref 0.0–5.0)
Cholesterol, Total: 125 mg/dL (ref 100–199)
HDL: 46 mg/dL (ref >39)
LDL Calculated: 52 mg/dL (ref 0–99)
Triglycerides: 133 mg/dL (ref 0–149)
VLDL Cholesterol Cal: 27 mg/dL (ref 5–40)

## 2019-01-30 ENCOUNTER — Other Ambulatory Visit: Payer: Self-pay

## 2019-01-30 ENCOUNTER — Ambulatory Visit: Payer: Self-pay | Admitting: Nurse Practitioner

## 2019-01-31 ENCOUNTER — Other Ambulatory Visit: Payer: Self-pay

## 2019-01-31 ENCOUNTER — Ambulatory Visit (INDEPENDENT_AMBULATORY_CARE_PROVIDER_SITE_OTHER): Payer: Medicare Other | Admitting: Nurse Practitioner

## 2019-01-31 ENCOUNTER — Encounter: Payer: Self-pay | Admitting: Nurse Practitioner

## 2019-01-31 VITALS — BP 135/83 | HR 67 | Temp 97.3°F | Ht 70.0 in | Wt 189.0 lb

## 2019-01-31 DIAGNOSIS — Z6827 Body mass index (BMI) 27.0-27.9, adult: Secondary | ICD-10-CM

## 2019-01-31 DIAGNOSIS — K219 Gastro-esophageal reflux disease without esophagitis: Secondary | ICD-10-CM | POA: Diagnosis not present

## 2019-01-31 DIAGNOSIS — E782 Mixed hyperlipidemia: Secondary | ICD-10-CM

## 2019-01-31 DIAGNOSIS — I10 Essential (primary) hypertension: Secondary | ICD-10-CM | POA: Diagnosis not present

## 2019-01-31 DIAGNOSIS — E559 Vitamin D deficiency, unspecified: Secondary | ICD-10-CM | POA: Diagnosis not present

## 2019-01-31 DIAGNOSIS — I4811 Longstanding persistent atrial fibrillation: Secondary | ICD-10-CM | POA: Diagnosis not present

## 2019-01-31 MED ORDER — OMEPRAZOLE 20 MG PO CPDR: 20.0000 mg | DELAYED_RELEASE_CAPSULE | Freq: Every day | ORAL | 1 refills | Status: DC

## 2019-01-31 MED ORDER — QUINAPRIL HCL 40 MG PO TABS: ORAL_TABLET | ORAL | 1 refills | Status: DC

## 2019-01-31 MED ORDER — HYDROCHLOROTHIAZIDE 25 MG PO TABS: 25.0000 mg | ORAL_TABLET | Freq: Every day | ORAL | 1 refills | Status: DC

## 2019-01-31 MED ORDER — SIMVASTATIN 20 MG PO TABS: 20.0000 mg | ORAL_TABLET | Freq: Every day | ORAL | 1 refills | Status: DC

## 2019-01-31 MED ORDER — METOPROLOL SUCCINATE ER 100 MG PO TB24: 100.0000 mg | ORAL_TABLET | Freq: Every day | ORAL | 1 refills | Status: DC

## 2019-01-31 MED ORDER — APIXABAN 2.5 MG PO TABS: ORAL_TABLET | ORAL | 1 refills | Status: DC

## 2019-01-31 NOTE — Progress Notes (Signed)
Subjective:    Patient ID: Frank Cook, male    DOB: 05/31/1932, 85 y.o.   MRN: [id number]   Chief Complaint: Medical Management of Chronic Issues    HPI:  1. HYPERTENSION, BENIGN No c/o chat pain, sob or headache. Does no check bloo dpressure at home. BP Readings from Last 3 Encounters:  01/31/19 135/83  10/24/18 (!) 157/88  07/31/18 123/84     2. Mixed hyperlipidemia Tries to watch diet. Does not do much exercise. Lab Results  Component Value Date   CHOL 125 10/24/2018   HDL 46 10/24/2018   LDLCALC 52 10/24/2018   TRIG 133 10/24/2018   CHOLHDL 2.7 10/24/2018     3. Longstanding persistent atrial fibrillation Denies any palpitations. He is currently on eliquis and denies any bleeding  4. Gastroesophageal reflux disease without esophagitis Is on daily omeprazole and is working well for him  5. Vitamin D deficiency Takes a daily vitamin d supplement  6. BMI 27.0-27.9,adult No recent weight changes    Outpatient Encounter Medications as of 01/31/2019  Medication Sig  . apixaban (ELIQUIS) 2.5 MG TABS tablet Take 1 tablet (2.5 mg total) by mouth 2 (two) times daily.  . Cholecalciferol (VITAMIN D3) 5000 units CAPS Take 5,000 Units by mouth daily.  . Glucosamine 500 MG CAPS Take 1,500 mg by mouth daily.  . hydrochlorothiazide (HYDRODIURIL) 25 MG tablet Take 1 tablet (25 mg total) by mouth daily.  . metoprolol succinate (TOPROL-XL) 100 MG 24 hr tablet Take 1 tablet (100 mg total) by mouth daily. Take with or immediately following a meal.  . Omega 3 1000 MG CAPS Take 2,000 mg by mouth daily.  Marland Kitchen omeprazole (PRILOSEC) 20 MG capsule Take 1 capsule (20 mg total) by mouth daily.  . quinapril (ACCUPRIL) 40 MG tablet TAKE 1 & 1/2 TABLETS ONCE DAILY  . simvastatin (ZOCOR) 20 MG tablet Take 1 tablet (20 mg total) by mouth daily.     Past Surgical History:  Procedure Laterality Date  . None      Family History  Problem Relation Age of Onset  . COPD Mother   .  Cancer Father        Bone  . Cancer Brother        Bone  . Arthritis Brother   . Hypertension Brother     New complaints: None today  Social history: His wife is at the [place] center and he has not been able to see her since march 2020.  Controlled substance contract: n/a    Review of Systems  Constitutional: Negative for activity change and appetite change.  HENT: Negative.   Eyes: Negative for pain.  Respiratory: Negative for shortness of breath.   Cardiovascular: Negative for chest pain, palpitations and leg swelling.  Gastrointestinal: Negative for abdominal pain.  Endocrine: Negative for polydipsia.  Genitourinary: Negative.   Skin: Negative for rash.  Neurological: Negative for dizziness, weakness and headaches.  Hematological: Does not bruise/bleed easily.  Psychiatric/Behavioral: Negative.   All other systems reviewed and are negative.      Objective:   Physical Exam Vitals signs and nursing note reviewed.  Constitutional:      Appearance: Normal appearance. He is well-developed.  HENT:     Head: Normocephalic.     Nose: Nose normal.  Eyes:     Pupils: Pupils are equal, round, and reactive to light.  Neck:     Musculoskeletal: Normal range of motion and neck supple.     Thyroid: No  thyroid mass or thyromegaly.     Vascular: No carotid bruit or JVD.     Trachea: Phonation normal.  Cardiovascular:     Rate and Rhythm: Normal rate and regular rhythm.  Pulmonary:     Effort: Pulmonary effort is normal. No respiratory distress.     Breath sounds: Normal breath sounds.  Abdominal:     General: Bowel sounds are normal.     Palpations: Abdomen is soft.     Tenderness: There is no abdominal tenderness.  Musculoskeletal: Normal range of motion.  Lymphadenopathy:     Cervical: No cervical adenopathy.  Skin:    General: Skin is warm and dry.  Neurological:     Mental Status: He is alert and oriented to person, place, and time.  Psychiatric:         Behavior: Behavior normal.        Thought Content: Thought content normal.        Judgment: Judgment normal.     BP 135/83   Pulse 67   Temp (!) 97.3 F (36.3 C) (Oral)   Ht 5\' 10"  (1.778 m)   Wt 189 lb (85.7 kg)   BMI 27.12 kg/m        Assessment & Plan:  Elizebeth KollerJoe W Lagerquist comes in today with chief complaint of Medical Management of Chronic Issues   Diagnosis and orders addressed:  1. HYPERTENSION, BENIGN Low sodium diet - hydrochlorothiazide (HYDRODIURIL) 25 MG tablet; Take 1 tablet (25 mg total) by mouth daily.  Dispense: 90 tablet; Refill: 1 - metoprolol succinate (TOPROL-XL) 100 MG 24 hr tablet; Take 1 tablet (100 mg total) by mouth daily. Take with or immediately following a meal.  Dispense: 90 tablet; Refill: 1 - quinapril (ACCUPRIL) 40 MG tablet; TAKE 1 & 1/2 TABLETS ONCE DAILY  Dispense: 135 tablet; Refill: 1  2. Mixed hyperlipidemia Low fat diet - simvastatin (ZOCOR) 20 MG tablet; Take 1 tablet (20 mg total) by mouth daily.  Dispense: 90 tablet; Refill: 1  3. Longstanding persistent atrial fibrillation - apixaban (ELIQUIS) 2.5 MG TABS tablet; Take 1 tablet (2.5 mg total) by mouth 2 (two) times daily.  Dispense: 180 tablet; Refill: 1  4. Gastroesophageal reflux disease without esophagitis Avoid spicy foods Do not eat 2 hours prior to bedtime - omeprazole (PRILOSEC) 20 MG capsule; Take 1 capsule (20 mg total) by mouth daily.  Dispense: 90 capsule; Refill: 1  5. Vitamin D deficiency continue daily vitamin d supplement  6. BMI 27.0-27.9,adult Discussed diet and exercise for person with BMI >25 Will recheck weight in 3-6 months   Labs pending Health Maintenance reviewed Diet and exercise encouraged  Follow up plan: 3 months   Mary-Margaret Daphine DeutscherMartin, FNP

## 2019-01-31 NOTE — Patient Instructions (Signed)

## 2019-02-01 LAB — LIPID PANEL
Chol/HDL Ratio: 2.9 ratio (ref 0.0–5.0)
Cholesterol, Total: 129 mg/dL (ref 100–199)
HDL: 44 mg/dL (ref >39)
LDL Calculated: 48 mg/dL (ref 0–99)
Triglycerides: 184 mg/dL — ABNORMAL HIGH (ref 0–149)
VLDL Cholesterol Cal: 37 mg/dL (ref 5–40)

## 2019-02-01 LAB — CMP14+EGFR
ALT: 16 IU/L (ref 0–44)
AST: 21 IU/L (ref 0–40)
Albumin/Globulin Ratio: 1.6 (ref 1.2–2.2)
Albumin: 4.4 g/dL (ref 3.6–4.6)
Alkaline Phosphatase: 45 IU/L (ref 39–117)
BUN/Creatinine Ratio: 14 (ref 10–24)
BUN: 21 mg/dL (ref 8–27)
Bilirubin Total: 0.5 mg/dL (ref 0.0–1.2)
CO2: 22 mmol/L (ref 20–29)
Calcium: 9.7 mg/dL (ref 8.6–10.2)
Chloride: 102 mmol/L (ref 96–106)
Creatinine, Ser: 1.5 mg/dL — ABNORMAL HIGH (ref 0.76–1.27)
GFR calc Af Amer: 48 mL/min/{1.73_m2} — ABNORMAL LOW (ref >59)
GFR calc non Af Amer: 42 mL/min/{1.73_m2} — ABNORMAL LOW (ref >59)
Globulin, Total: 2.8 g/dL (ref 1.5–4.5)
Glucose: 175 mg/dL — ABNORMAL HIGH (ref 65–99)
Potassium: 3.8 mmol/L (ref 3.5–5.2)
Sodium: 139 mmol/L (ref 134–144)
Total Protein: 7.2 g/dL (ref 6.0–8.5)

## 2019-02-17 DIAGNOSIS — I739 Peripheral vascular disease, unspecified: Secondary | ICD-10-CM | POA: Insufficient documentation

## 2019-02-17 NOTE — Progress Notes (Signed)
HPI The patient presented for evaluation of an abnormal EKG. The computer reading of this in October of 2016 suggested atrial fibrillation.  However, this EKG was actually sinus rhythm with PACs.  A more recent EKG in July of 2017 demonstrated atrial fibrillation.  Holter has demonstrated rate control.  He was treated with Eliquis.  The patient denies any new symptoms such as chest discomfort, neck or arm discomfort. There has been no new shortness of breath, PND or orthopnea. There have been no reported palpitations, presyncope or syncope.    His wife is in a nursing home and had to be moved to [place] because she has COVID 19.    Of note today we spent a long time talking about that he should not be walking up and down the stairs doing his laundry.  He lives alone and is very unsteady on his feet.  I think he came up with an alternative and can drive around the back of the house and walk to the basement.  Alternatively his niece could do his laundry for him.  No Known Allergies  Current Outpatient Medications  Medication Sig Dispense Refill  . apixaban (ELIQUIS) 2.5 MG TABS tablet Take 1 tablet (2.5 mg total) by mouth 2 (two) times daily. 180 tablet 1  . Cholecalciferol (VITAMIN D3) 5000 units CAPS Take 5,000 Units by mouth daily.    . Glucosamine 500 MG CAPS Take 1,500 mg by mouth daily.    . hydrochlorothiazide (HYDRODIURIL) 25 MG tablet Take 1 tablet (25 mg total) by mouth daily. 90 tablet 1  . metoprolol succinate (TOPROL-XL) 100 MG 24 hr tablet Take 1 tablet (100 mg total) by mouth daily. Take with or immediately following a meal. 90 tablet 1  . Omega 3 1000 MG CAPS Take 2,000 mg by mouth daily.    Marland Kitchen. omeprazole (PRILOSEC) 20 MG capsule Take 1 capsule (20 mg total) by mouth daily. 90 capsule 1  . quinapril (ACCUPRIL) 40 MG tablet TAKE 1 & 1/2 TABLETS ONCE DAILY (Patient taking differently: Take by mouth daily. TAKE 1 & 1/2 TABLETS ONCE DAILY) 135 tablet 1  . simvastatin (ZOCOR)  20 MG tablet Take 1 tablet (20 mg total) by mouth daily. 90 tablet 1   No current facility-administered medications for this visit.     Past Medical History:  Diagnosis Date  . Atrial fibrillation (HCC)   . Colon polyps   . GERD (gastroesophageal reflux disease)   . Hyperlipidemia   . Hypertension   . Osteopenia     Past Surgical History:  Procedure Laterality Date  . None      ROS:  As stated in the HPI and negative for all other systems.  PHYSICAL EXAM BP 140/82   Pulse 83   Ht 5\' 10"  (1.778 m)   Wt 189 lb (85.7 kg)   BMI 27.12 kg/m   GENERAL:  Well appearing NECK:  No jugular venous distention, waveform within normal limits, carotid upstroke brisk and symmetric, no bruits, no thyromegaly LUNGS:  Clear to auscultation bilaterally CHEST:  Unremarkable HEART:  PMI not displaced or sustained,S1 and S2 within normal limits, no S3,clicks, no rubs, no murmurs, irregular  ABD:  Flat, positive bowel sounds normal in frequency in pitch, no bruits, no rebound, no guarding, no midline pulsatile mass, no hepatomegaly, no splenomegaly EXT:  2 plus pulses throughout, trace edema, no cyanosis no clubbing    Lab Results  Component Value Date   CREATININE 1.50 (H)  01/31/2019     EKG: Atrial fibrillation, rate 83, axis within normal limits, right bundle branch block, no change from previous  02/19/2019   ASSESSMENT AND PLAN  ATRIAL FIB:    Mr. Melchor Kirchgessner Gobin has a CHA2DS2 - VASc score of 3 with a risk of stroke of 3.2%.  No change in therapy.    I will check a CBC as he has not had one on awhile.    HTN:   Blood pressure is controlled.  No change in therapy.   CKD:  His creatinine was stable elevated as above.

## 2019-02-19 ENCOUNTER — Other Ambulatory Visit: Payer: Medicare Other

## 2019-02-19 ENCOUNTER — Ambulatory Visit (INDEPENDENT_AMBULATORY_CARE_PROVIDER_SITE_OTHER): Payer: Medicare Other | Admitting: Cardiology

## 2019-02-19 ENCOUNTER — Other Ambulatory Visit: Payer: Self-pay

## 2019-02-19 ENCOUNTER — Encounter: Payer: Self-pay | Admitting: Cardiology

## 2019-02-19 VITALS — BP 140/82 | HR 83 | Ht 70.0 in | Wt 189.0 lb

## 2019-02-19 DIAGNOSIS — I4811 Longstanding persistent atrial fibrillation: Secondary | ICD-10-CM

## 2019-02-19 DIAGNOSIS — Z79899 Other long term (current) drug therapy: Secondary | ICD-10-CM

## 2019-02-19 DIAGNOSIS — I739 Peripheral vascular disease, unspecified: Secondary | ICD-10-CM

## 2019-02-19 DIAGNOSIS — I10 Essential (primary) hypertension: Secondary | ICD-10-CM | POA: Diagnosis not present

## 2019-02-19 NOTE — Patient Instructions (Addendum)
Medication Instructions:  The current medical regimen is effective;  continue present plan and medications.  If you need a refill on your cardiac medications before your next appointment, please call your pharmacy.   Please have blood work today at [facility] ([facility])  Follow-Up: Follow up in 1 year with Dr. Percival Spanish.  You will receive a letter in the mail 2 months before you are due.  Please call us when you receive this letter to schedule your follow up appointment.  Thank you for choosing [facility]!!

## 2019-02-20 LAB — CBC
Hematocrit: 43.9 % (ref 37.5–51.0)
Hemoglobin: 14.8 g/dL (ref 13.0–17.7)
MCH: 29.7 pg (ref 26.6–33.0)
MCHC: 33.7 g/dL (ref 31.5–35.7)
MCV: 88 fL (ref 79–97)
Platelets: 230 10*3/uL (ref 150–450)
RBC: 4.98 x10E6/uL (ref 4.14–5.80)
RDW: 13.7 % (ref 11.6–15.4)
WBC: 7.2 10*3/uL (ref 3.4–10.8)

## 2019-03-18 DIAGNOSIS — Z23 Encounter for immunization: Secondary | ICD-10-CM | POA: Diagnosis not present

## 2019-04-29 ENCOUNTER — Other Ambulatory Visit: Payer: Self-pay

## 2019-04-29 ENCOUNTER — Encounter: Payer: Self-pay | Admitting: Nurse Practitioner

## 2019-04-29 ENCOUNTER — Ambulatory Visit (INDEPENDENT_AMBULATORY_CARE_PROVIDER_SITE_OTHER): Payer: Medicare Other | Admitting: Nurse Practitioner

## 2019-04-29 DIAGNOSIS — Z6827 Body mass index (BMI) 27.0-27.9, adult: Secondary | ICD-10-CM

## 2019-04-29 DIAGNOSIS — I4811 Longstanding persistent atrial fibrillation: Secondary | ICD-10-CM | POA: Diagnosis not present

## 2019-04-29 DIAGNOSIS — K219 Gastro-esophageal reflux disease without esophagitis: Secondary | ICD-10-CM | POA: Diagnosis not present

## 2019-04-29 DIAGNOSIS — E782 Mixed hyperlipidemia: Secondary | ICD-10-CM | POA: Diagnosis not present

## 2019-04-29 DIAGNOSIS — E559 Vitamin D deficiency, unspecified: Secondary | ICD-10-CM

## 2019-04-29 DIAGNOSIS — I10 Essential (primary) hypertension: Secondary | ICD-10-CM | POA: Diagnosis not present

## 2019-04-29 MED ORDER — APIXABAN 2.5 MG PO TABS: ORAL_TABLET | ORAL | 1 refills | Status: DC

## 2019-04-29 MED ORDER — OMEPRAZOLE 20 MG PO CPDR: 20.0000 mg | DELAYED_RELEASE_CAPSULE | Freq: Every day | ORAL | 1 refills | Status: DC

## 2019-04-29 MED ORDER — SIMVASTATIN 20 MG PO TABS: 20.0000 mg | ORAL_TABLET | Freq: Every day | ORAL | 1 refills | Status: DC

## 2019-04-29 MED ORDER — QUINAPRIL HCL 40 MG PO TABS: ORAL_TABLET | ORAL | 1 refills | Status: DC

## 2019-04-29 MED ORDER — HYDROCHLOROTHIAZIDE 25 MG PO TABS: 25.0000 mg | ORAL_TABLET | Freq: Every day | ORAL | 1 refills | Status: DC

## 2019-04-29 MED ORDER — METOPROLOL SUCCINATE ER 100 MG PO TB24: 100.0000 mg | ORAL_TABLET | Freq: Every day | ORAL | 1 refills | Status: DC

## 2019-04-29 NOTE — Progress Notes (Signed)
Virtual Visit via telephone Note Due to COVID-19 pandemic this visit was conducted virtually. This visit type was conducted due to national recommendations for restrictions regarding the COVID-19 Pandemic (e.g. social distancing, sheltering in place) in an effort to limit this patient's exposure and mitigate transmission in our community. All issues noted in this document were discussed and addressed.  A physical exam was not performed with this format.  I connected with Frank Cook on 04/29/19 at 2:10 by telephone and verified that I am speaking with the correct person using two identifiers. Frank Cook is currently located at our office and no one is currently with him during visit. The provider, Mary-Margaret Hassell Done, FNP is located in their office at time of visit.  I discussed the limitations, risks, security and privacy concerns of performing an evaluation and management service by telephone and the availability of in person appointments. I also discussed with the patient that there may be a patient responsible charge related to this service. The patient expressed understanding and agreed to proceed.   History and Present Illness:    Chief Complaint: Medical Management of Chronic Issues    HPI:  1. HYPERTENSION, BENIGN No c/o chest pain, sob or headches. He does not check blood pressure at home. BP Readings from Last 3 Encounters:  02/19/19 140/82  01/31/19 135/83  10/24/18 (!) 157/88     2. Mixed hyperlipidemia Does not watch and does no dedicated exercise. He eats out a lot because it is difficult to cook for one person. Lab Results  Component Value Date   CHOL 129 01/31/2019   HDL 44 01/31/2019   LDLCALC 48 01/31/2019   TRIG 184 (H) 01/31/2019   CHOLHDL 2.9 01/31/2019     3. Longstanding persistent atrial fibrillation (HCC) Is on daily dose of eliquis. Denies any bleeding. Denies palpitations or heart racing.  4. Gastroesophageal reflux disease without  esophagitis Is on daily dose omeprazole and is doing well.  5. Vitamin D deficiency Takes daily vitamin d supplement  6. BMI 27.0-27.9,adult No recent weight changes Wt Readings from Last 3 Encounters:  02/19/19 189 lb (85.7 kg)  01/31/19 189 lb (85.7 kg)  10/24/18 192 lb (87.1 kg)   BMI Readings from Last 3 Encounters:  02/19/19 27.12 kg/m  01/31/19 27.12 kg/m  10/24/18 27.55 kg/m       Outpatient Encounter Medications as of 04/29/2019  Medication Sig  . apixaban (ELIQUIS) 2.5 MG TABS tablet Take 1 tablet (2.5 mg total) by mouth 2 (two) times daily.  . Cholecalciferol (VITAMIN D3) 5000 units CAPS Take 5,000 Units by mouth daily.  . Glucosamine 500 MG CAPS Take 1,500 mg by mouth daily.  . hydrochlorothiazide (HYDRODIURIL) 25 MG tablet Take 1 tablet (25 mg total) by mouth daily.  . metoprolol succinate (TOPROL-XL) 100 MG 24 hr tablet Take 1 tablet (100 mg total) by mouth daily. Take with or immediately following a meal.  . Omega 3 1000 MG CAPS Take 2,000 mg by mouth daily.  Marland Kitchen omeprazole (PRILOSEC) 20 MG capsule Take 1 capsule (20 mg total) by mouth daily.  . quinapril (ACCUPRIL) 40 MG tablet TAKE 1 & 1/2 TABLETS ONCE DAILY (Patient taking differently: Take by mouth daily. TAKE 1 & 1/2 TABLETS ONCE DAILY)  . simvastatin (ZOCOR) 20 MG tablet Take 1 tablet (20 mg total) by mouth daily.     Past Surgical History:  Procedure Laterality Date  . None      Family History  Problem Relation Age  of Onset  . COPD Mother   . Cancer Father        Bone  . Cancer Brother        Bone  . Arthritis Brother   . Hypertension Brother     New complaints: None today  Social history: Lives by hisself- his wife is in a nursing home.  Controlled substance contract: n/a    Review of Systems  Constitutional: Negative for diaphoresis and weight loss.  Eyes: Negative for blurred vision, double vision and pain.  Respiratory: Negative for shortness of breath.   Cardiovascular:  Negative for chest pain, palpitations, orthopnea and leg swelling.  Gastrointestinal: Negative for abdominal pain.  Skin: Negative for rash.  Neurological: Negative for dizziness, sensory change, loss of consciousness, weakness and headaches.  Endo/Heme/Allergies: Negative for polydipsia. Does not bruise/bleed easily.  Psychiatric/Behavioral: Negative for memory loss. The patient does not have insomnia.   All other systems reviewed and are negative.    Observations/Objective: Alert and oriented- answers all questions appropriately No distress    Assessment and Plan: Carthel Castille [name] comes in today with chief complaint of Medical Management of Chronic Issues   Diagnosis and orders addressed:  1. HYPERTENSION, BENIGN Low sodium diet - hydrochlorothiazide (HYDRODIURIL) 25 MG tablet; Take 1 tablet (25 mg total) by mouth daily.  Dispense: 90 tablet; Refill: 1 - metoprolol succinate (TOPROL-XL) 100 MG 24 hr tablet; Take 1 tablet (100 mg total) by mouth daily. Take with or immediately following a meal.  Dispense: 90 tablet; Refill: 1 - quinapril (ACCUPRIL) 40 MG tablet; TAKE 1 & 1/2 TABLETS ONCE DAILY  Dispense: 135 tablet; Refill: 1  2. Mixed hyperlipidemia Low fat diet - simvastatin (ZOCOR) 20 MG tablet; Take 1 tablet (20 mg total) by mouth daily.  Dispense: 90 tablet; Refill: 1  3. Longstanding persistent atrial fibrillation (HCC) Avoid caffeine - apixaban (ELIQUIS) 2.5 MG TABS tablet; Take 1 tablet (2.5 mg total) by mouth 2 (two) times daily.  Dispense: 180 tablet; Refill: 1  4. Gastroesophageal reflux disease without esophagitis Avoid spicy foods Do not eat 2 hours prior to bedtime - omeprazole (PRILOSEC) 20 MG capsule; Take 1 capsule (20 mg total) by mouth daily.  Dispense: 90 capsule; Refill: 1  5. Vitamin D deficiency Continue daily vitamin d   6. BMI 27.0-27.9,adult Discussed diet and exercise for person with BMI >25 Will recheck weight in 3-6 months   Labs pending  Health Maintenance reviewed Diet and exercise encouraged  Follow up plan: 3 months     I discussed the assessment and treatment plan with the patient. The patient was provided an opportunity to ask questions and all were answered. The patient agreed with the plan and demonstrated an understanding of the instructions.   The patient was advised to call back or seek an in-person evaluation if the symptoms worsen or if the condition fails to improve as anticipated.  The above assessment and management plan was discussed with the patient. The patient verbalized understanding of and has agreed to the management plan. Patient is aware to call the clinic if symptoms persist or worsen. Patient is aware when to return to the clinic for a follow-up visit. Patient educated on when it is appropriate to go to the emergency department.   Time call ended:  2:26 I provided 16 minutes of non-face-to-face time during this encounter.    Mary-Margaret Daphine Deutscher, FNP

## 2019-07-01 DIAGNOSIS — Z23 Encounter for immunization: Secondary | ICD-10-CM | POA: Diagnosis not present

## 2019-07-29 ENCOUNTER — Other Ambulatory Visit: Payer: Self-pay

## 2019-07-29 DIAGNOSIS — Z23 Encounter for immunization: Secondary | ICD-10-CM | POA: Diagnosis not present

## 2019-07-30 ENCOUNTER — Ambulatory Visit (INDEPENDENT_AMBULATORY_CARE_PROVIDER_SITE_OTHER): Payer: Medicare Other | Admitting: Nurse Practitioner

## 2019-07-30 ENCOUNTER — Encounter: Payer: Self-pay | Admitting: Nurse Practitioner

## 2019-07-30 VITALS — BP 129/69 | HR 76 | Temp 98.0°F | Ht 70.0 in | Wt 190.2 lb

## 2019-07-30 DIAGNOSIS — I10 Essential (primary) hypertension: Secondary | ICD-10-CM

## 2019-07-30 DIAGNOSIS — K219 Gastro-esophageal reflux disease without esophagitis: Secondary | ICD-10-CM | POA: Diagnosis not present

## 2019-07-30 DIAGNOSIS — E782 Mixed hyperlipidemia: Secondary | ICD-10-CM

## 2019-07-30 DIAGNOSIS — E559 Vitamin D deficiency, unspecified: Secondary | ICD-10-CM

## 2019-07-30 DIAGNOSIS — I4811 Longstanding persistent atrial fibrillation: Secondary | ICD-10-CM

## 2019-07-30 DIAGNOSIS — Z6827 Body mass index (BMI) 27.0-27.9, adult: Secondary | ICD-10-CM | POA: Diagnosis not present

## 2019-07-30 MED ORDER — HYDROCHLOROTHIAZIDE 25 MG PO TABS: 25.0000 mg | ORAL_TABLET | Freq: Every day | ORAL | 1 refills | Status: DC

## 2019-07-30 MED ORDER — OMEPRAZOLE 20 MG PO CPDR: 20.0000 mg | DELAYED_RELEASE_CAPSULE | Freq: Every day | ORAL | 1 refills | Status: DC

## 2019-07-30 MED ORDER — METOPROLOL SUCCINATE ER 100 MG PO TB24: 100.0000 mg | ORAL_TABLET | Freq: Every day | ORAL | 1 refills | Status: DC

## 2019-07-30 MED ORDER — QUINAPRIL HCL 40 MG PO TABS: ORAL_TABLET | ORAL | 1 refills | Status: DC

## 2019-07-30 MED ORDER — SIMVASTATIN 20 MG PO TABS: 20.0000 mg | ORAL_TABLET | Freq: Every day | ORAL | 1 refills | Status: DC

## 2019-07-30 MED ORDER — APIXABAN 2.5 MG PO TABS: ORAL_TABLET | ORAL | 1 refills | Status: DC

## 2019-07-30 NOTE — Progress Notes (Signed)
Subjective:    Patient ID: Frank Cook, male    DOB: 12-06-1931, 86 y.o.   MRN: [id number]   Chief Complaint: Medical Management of Chronic Issues and Hypertension    HPI:  1. HYPERTENSION, BENIGN No c/o chest pain, sob or headache. Does nt check blood pressure at home. BP Readings from Last 3 Encounters:  07/30/19 129/69  02/19/19 140/82  01/31/19 135/83     2. Longstanding persistent atrial fibrillation (HCC) No c/o sob or palpitations. Is on eliquis without problems.  3. Gastroesophageal reflux disease without esophagitis Is on omeprazole daily and is doing well.  4. Mixed hyperlipidemia Does not really wtach diet and does little to no exercise. Lab Results  Component Value Date   CHOL 129 01/31/2019   HDL 44 01/31/2019   LDLCALC 48 01/31/2019   TRIG 184 (H) 01/31/2019   CHOLHDL 2.9 01/31/2019     5. Vitamin D deficiency Takes a dialy vitamin d supplement  6. BMI 27.0-27.9,adult No recent weight changes Wt Readings from Last 3 Encounters:  07/30/19 190 lb 4 oz (86.3 kg)  02/19/19 189 lb (85.7 kg)  01/31/19 189 lb (85.7 kg)   BMI Readings from Last 3 Encounters:  07/30/19 27.30 kg/m  02/19/19 27.12 kg/m  01/31/19 27.12 kg/m       Outpatient Encounter Medications as of 07/30/2019  Medication Sig  . apixaban (ELIQUIS) 2.5 MG TABS tablet Take 1 tablet (2.5 mg total) by mouth 2 (two) times daily.  . Cholecalciferol (VITAMIN D3) 5000 units CAPS Take 5,000 Units by mouth daily.  . Glucosamine 500 MG CAPS Take 1,500 mg by mouth daily.  . hydrochlorothiazide (HYDRODIURIL) 25 MG tablet Take 1 tablet (25 mg total) by mouth daily.  . metoprolol succinate (TOPROL-XL) 100 MG 24 hr tablet Take 1 tablet (100 mg total) by mouth daily. Take with or immediately following a meal.  . Omega 3 1000 MG CAPS Take 2,000 mg by mouth daily.  Marland Kitchen omeprazole (PRILOSEC) 20 MG capsule Take 1 capsule (20 mg total) by mouth daily.  . quinapril (ACCUPRIL) 40 MG tablet TAKE 1 &  1/2 TABLETS ONCE DAILY  . simvastatin (ZOCOR) 20 MG tablet Take 1 tablet (20 mg total) by mouth daily.     Past Surgical History:  Procedure Laterality Date  . None      Family History  Problem Relation Age of Onset  . COPD Mother   . Cancer Father        Bone  . Cancer Brother        Bone  . Arthritis Brother   . Hypertension Brother     New complaints: None today  Social history: His wife lives in a nursing home and he is not able to see her. He does face time her on occasion.  Controlled substance contract: n/a    Review of Systems  Constitutional: Negative for diaphoresis.  Eyes: Negative for pain.  Respiratory: Negative for shortness of breath.   Cardiovascular: Negative for chest pain, palpitations and leg swelling.  Gastrointestinal: Negative for abdominal pain.  Endocrine: Negative for polydipsia.  Skin: Negative for rash.  Neurological: Negative for dizziness, weakness and headaches.  Hematological: Does not bruise/bleed easily.  All other systems reviewed and are negative.      Objective:   Physical Exam Vitals and nursing note reviewed.  Constitutional:      Appearance: Normal appearance. He is well-developed.  HENT:     Head: Normocephalic.     Nose: Nose  normal.  Eyes:     Pupils: Pupils are equal, round, and reactive to light.  Neck:     Thyroid: No thyroid mass or thyromegaly.     Vascular: No carotid bruit or JVD.     Trachea: Phonation normal.  Cardiovascular:     Rate and Rhythm: Normal rate and regular rhythm.  Pulmonary:     Effort: Pulmonary effort is normal. No respiratory distress.     Breath sounds: Normal breath sounds.  Abdominal:     General: Bowel sounds are normal.     Palpations: Abdomen is soft.     Tenderness: There is no abdominal tenderness.  Musculoskeletal:        General: Normal range of motion.     Cervical back: Normal range of motion and neck supple.  Lymphadenopathy:     Cervical: No cervical adenopathy.    Skin:    General: Skin is warm and dry.  Neurological:     Mental Status: He is alert and oriented to person, place, and time.  Psychiatric:        Behavior: Behavior normal.        Thought Content: Thought content normal.        Judgment: Judgment normal.     BP 129/69   Pulse 76   Temp 98 F (36.7 C)   Ht 5\' 10"  (1.778 m)   Wt 190 lb 4 oz (86.3 kg)   SpO2 99%   BMI 27.30 kg/m        Assessment & Plan:  Frank Cook comes in today with chief complaint of Medical Management of Chronic Issues and Hypertension   Diagnosis and orders addressed:  1. HYPERTENSION, BENIGN Low sodium diet - hydrochlorothiazide (HYDRODIURIL) 25 MG tablet; Take 1 tablet (25 mg total) by mouth daily.  Dispense: 90 tablet; Refill: 1 - metoprolol succinate (TOPROL-XL) 100 MG 24 hr tablet; Take 1 tablet (100 mg total) by mouth daily. Take with or immediately following a meal.  Dispense: 90 tablet; Refill: 1 - quinapril (ACCUPRIL) 40 MG tablet; TAKE 1 & 1/2 TABLETS ONCE DAILY  Dispense: 135 tablet; Refill: 1  2. Longstanding persistent atrial fibrillation (HCC) Report any bleeding - apixaban (ELIQUIS) 2.5 MG TABS tablet; Take 1 tablet (2.5 mg total) by mouth 2 (two) times daily.  Dispense: 180 tablet; Refill: 1  3. Gastroesophageal reflux disease without esophagitis Avoid spicy foods Do not eat 2 hours prior to bedtime - omeprazole (PRILOSEC) 20 MG capsule; Take 1 capsule (20 mg total) by mouth daily.  Dispense: 90 capsule; Refill: 1  4. Mixed hyperlipidemia Low fat diet - simvastatin (ZOCOR) 20 MG tablet; Take 1 tablet (20 mg total) by mouth daily.  Dispense: 90 tablet; Refill: 1  5. Vitamin D deficiency  6. BMI 27.0-27.9,adult Discussed diet and exercise for person with BMI >25 Will recheck weight in 3-6 months   Labs pending Health Maintenance reviewed Diet and exercise encouraged  Follow up plan: 3 months   Mary-Margaret Hassell Done, FNP

## 2019-07-30 NOTE — Addendum Note (Signed)
Addended by: Bennie Pierini on: 07/30/2019 01:08 PM   Modules accepted: Orders

## 2019-07-30 NOTE — Patient Instructions (Signed)

## 2019-07-31 LAB — CBC WITH DIFFERENTIAL/PLATELET
Basophils Absolute: 0 10*3/uL (ref 0.0–0.2)
Basos: 1 %
EOS (ABSOLUTE): 0.1 10*3/uL (ref 0.0–0.4)
Eos: 2 %
Hematocrit: 44.1 % (ref 37.5–51.0)
Hemoglobin: 15.2 g/dL (ref 13.0–17.7)
Immature Grans (Abs): 0 10*3/uL (ref 0.0–0.1)
Immature Granulocytes: 1 %
Lymphocytes Absolute: 0.8 10*3/uL (ref 0.7–3.1)
Lymphs: 10 %
MCH: 30.2 pg (ref 26.6–33.0)
MCHC: 34.5 g/dL (ref 31.5–35.7)
MCV: 88 fL (ref 79–97)
Monocytes Absolute: 0.5 10*3/uL (ref 0.1–0.9)
Monocytes: 7 %
Neutrophils Absolute: 6.6 10*3/uL (ref 1.4–7.0)
Neutrophils: 79 %
Platelets: 197 10*3/uL (ref 150–450)
RBC: 5.04 x10E6/uL (ref 4.14–5.80)
RDW: 13.9 % (ref 11.6–15.4)
WBC: 8.1 10*3/uL (ref 3.4–10.8)

## 2019-07-31 LAB — LIPID PANEL
Chol/HDL Ratio: 2.6 ratio (ref 0.0–5.0)
Cholesterol, Total: 119 mg/dL (ref 100–199)
HDL: 46 mg/dL (ref >39)
LDL Chol Calc (NIH): 55 mg/dL (ref 0–99)
Triglycerides: 98 mg/dL (ref 0–149)
VLDL Cholesterol Cal: 18 mg/dL (ref 5–40)

## 2019-07-31 LAB — CMP14+EGFR
ALT: 17 IU/L (ref 0–44)
AST: 21 IU/L (ref 0–40)
Albumin/Globulin Ratio: 1.4 (ref 1.2–2.2)
Albumin: 4.3 g/dL (ref 3.6–4.6)
Alkaline Phosphatase: 51 IU/L (ref 39–117)
BUN/Creatinine Ratio: 12 (ref 10–24)
BUN: 18 mg/dL (ref 8–27)
Bilirubin Total: 0.6 mg/dL (ref 0.0–1.2)
CO2: 23 mmol/L (ref 20–29)
Calcium: 9.8 mg/dL (ref 8.6–10.2)
Chloride: 101 mmol/L (ref 96–106)
Creatinine, Ser: 1.45 mg/dL — ABNORMAL HIGH (ref 0.76–1.27)
GFR calc Af Amer: 50 mL/min/{1.73_m2} — ABNORMAL LOW (ref >59)
GFR calc non Af Amer: 44 mL/min/{1.73_m2} — ABNORMAL LOW (ref >59)
Globulin, Total: 3 g/dL (ref 1.5–4.5)
Glucose: 105 mg/dL — ABNORMAL HIGH (ref 65–99)
Potassium: 4.1 mmol/L (ref 3.5–5.2)
Sodium: 139 mmol/L (ref 134–144)
Total Protein: 7.3 g/dL (ref 6.0–8.5)

## 2019-08-04 ENCOUNTER — Ambulatory Visit (INDEPENDENT_AMBULATORY_CARE_PROVIDER_SITE_OTHER): Payer: Medicare Other | Admitting: *Deleted

## 2019-08-04 DIAGNOSIS — Z0000 Encounter for general adult medical examination without abnormal findings: Secondary | ICD-10-CM

## 2019-08-04 NOTE — Progress Notes (Signed)
MEDICARE ANNUAL WELLNESS VISIT  08/04/2019  Telephone Visit Disclaimer This Medicare AWV was conducted by telephone due to national recommendations for restrictions regarding the COVID-19 Pandemic (e.g. social distancing).  I verified, using two identifiers, that I am speaking with Frank Cook or their authorized healthcare agent. I discussed the limitations, risks, security, and privacy concerns of performing an evaluation and management service by telephone and the potential availability of an in-person appointment in the future. The patient expressed understanding and agreed to proceed.   Subjective:  Frank Cook is a 86 y.o. male patient of Frank Pierini, FNP who had a Medicare Annual Wellness Visit today via telephone. Frank Cook is Retired and lives alone. He has no children, however he does have a niece and nephew who assist him. He reports that he is socially active and does interact with friends/family regularly. He is minimally physically active and enjoys watching television and nascar races.  Patient Care Team: Frank Pierini, FNP as PCP - General (Nurse Practitioner) Frank Rotunda, MD as Consulting Physician (Cardiology)  Advanced Directives 08/04/2019 07/31/2018 07/26/2017 07/19/2016  Does Patient Have a Medical Advance Directive? Yes Yes Yes Yes  Type of Estate agent of [place];Living will Living will Healthcare Power of [place];Living will Healthcare Power of [place];Living will  Does patient want to make changes to medical advance directive? - No - Patient declined - No - Patient declined  Copy of Healthcare Power of Attorney in Chart? Yes - validated most recent copy scanned in chart (See row information) - Yes Yes    Hospital Utilization Over the Past 12 Months: # of hospitalizations or ER visits: 0 # of surgeries: 0  Review of Systems    Patient reports that his overall health is worse compared to last year.  History reviewed  with patient and chart.  Patient Reported Readings (BP, Pulse, CBG, Weight, etc) 121/69  Pain Assessment Pain : No/denies pain     Current Medications & Allergies (verified) Allergies as of 08/04/2019   No Known Allergies     Medication List       Accurate as of August 04, 2019 10:33 AM. If you have any questions, ask your nurse or doctor.        apixaban 2.5 MG Tabs tablet Commonly known as: Eliquis Take 1 tablet (2.5 mg total) by mouth 2 (two) times daily.   Glucosamine 500 MG Caps Take 1,500 mg by mouth daily.   hydrochlorothiazide 25 MG tablet Commonly known as: HYDRODIURIL Take 1 tablet (25 mg total) by mouth daily.   metoprolol succinate 100 MG 24 hr tablet Commonly known as: TOPROL-XL Take 1 tablet (100 mg total) by mouth daily. Take with or immediately following a meal.   Omega 3 1000 MG Caps Take 2,000 mg by mouth daily.   omeprazole 20 MG capsule Commonly known as: PRILOSEC Take 1 capsule (20 mg total) by mouth daily.   quinapril 40 MG tablet Commonly known as: ACCUPRIL TAKE 1 & 1/2 TABLETS ONCE DAILY   simvastatin 20 MG tablet Commonly known as: ZOCOR Take 1 tablet (20 mg total) by mouth daily.   Vitamin D3 125 MCG (5000 UT) Caps Take 5,000 Units by mouth daily.       History (reviewed): Past Medical History:  Diagnosis Date  . Atrial fibrillation (HCC)   . Colon polyps   . GERD (gastroesophageal reflux disease)   . Hyperlipidemia   . Hypertension   . Osteopenia    Past Surgical  History:  Procedure Laterality Date  . COLONOSCOPY    . None     Family History  Problem Relation Age of Onset  . COPD Mother   . Cancer Father        Bone  . Cancer Brother        Bone  . Stroke Maternal Grandfather   . Arthritis Brother   . Hypertension Brother    Social History   Socioeconomic History  . Marital status: Married    Spouse name: Not on file  . Number of children: 0  . Years of education: Not on file  . Highest education level:  High school graduate  Occupational History  . Occupation: Retired    Fish farm manager: [employer]  Tobacco Use  . Smoking status: Never Smoker  . Smokeless tobacco: Never Used  . Tobacco comment: Never smoker   Substance and Sexual Activity  . Alcohol use: No  . Drug use: No  . Sexual activity: Not on file  Other Topics Concern  . Not on file  Social History Narrative   Lives alone.  Wife is now in nursing home.   Social Determinants of Health   Financial Resource Strain:   . Difficulty of Paying Living Expenses: Not on file  Food Insecurity:   . Worried About Charity fundraiser in the Last Year: Not on file  . Ran Out of Food in the Last Year: Not on file  Transportation Needs:   . Lack of Transportation (Medical): Not on file  . Lack of Transportation (Non-Medical): Not on file  Physical Activity:   . Days of Exercise per Week: Not on file  . Minutes of Exercise per Session: Not on file  Stress:   . Feeling of Stress : Not on file  Social Connections:   . Frequency of Communication with Friends and Family: Not on file  . Frequency of Social Gatherings with Friends and Family: Not on file  . Attends Religious Services: Not on file  . Active Member of Clubs or Organizations: Not on file  . Attends Archivist Meetings: Not on file  . Marital Status: Not on file    Activities of Daily Living In your present state of health, do you have any difficulty performing the following activities: 08/04/2019  Hearing? N  Vision? N  Difficulty concentrating or making decisions? Y  Comment at times  Walking or climbing stairs? N  Dressing or bathing? N  Doing errands, shopping? N  Preparing Food and eating ? N  Using the Toilet? N  In the past six months, have you accidently leaked urine? N  Do you have problems with loss of bowel control? N  Managing your Medications? N  Managing your Finances? N  Housekeeping or managing your Housekeeping? N  Some recent data might be hidden      Patient Education/ Literacy How often do you need to have someone help you when you read instructions, pamphlets, or other written materials from your doctor or pharmacy?: 3 - Sometimes What is the last grade level you completed in school?: 12th  Exercise Current Exercise Habits: The patient does not participate in regular exercise at present, Exercise limited by: Other - see comments(age)  Diet Patient reports consuming 3 meals a day and 1 snack(s) a day Patient reports that his primary diet is: Regular Patient reports that she does have regular access to food.   Depression Screen PHQ 2/9 Scores 08/04/2019 07/30/2019 04/29/2019 01/31/2019 10/24/2018 07/31/2018 07/25/2018  PHQ - 2 Score 0 0 0 0 0 0 0     Fall Risk Fall Risk  08/04/2019 07/30/2019 04/29/2019 01/31/2019 10/24/2018  Falls in the past year? 1 0 0 0 0  Number falls in past yr: 0 - - - -  Injury with Fall? 0 - - - -  Comment - - - - -  Risk for fall due to : History of fall(s) - - - -  Follow up Falls prevention discussed - - - -     Objective:  Sava W Mangano seemed alert and oriented and he participated appropriately during our telephone visit.  Blood Pressure Weight BMI  BP Readings from Last 3 Encounters:  07/30/19 129/69  02/19/19 140/82  01/31/19 135/83   Wt Readings from Last 3 Encounters:  07/30/19 190 lb 4 oz (86.3 kg)  02/19/19 189 lb (85.7 kg)  01/31/19 189 lb (85.7 kg)   BMI Readings from Last 1 Encounters:  07/30/19 27.30 kg/m    *Unable to obtain current vital signs, weight, and BMI due to telephone visit type  Hearing/Vision  . Mohammed did not seem to have difficulty with hearing/understanding during the telephone conversation . Reports that he has not had a formal eye exam by an eye care professional within the past year . Reports that he has not had a formal hearing evaluation within the past year *Unable to fully assess hearing and vision during telephone visit type  Cognitive Function: 6CIT  Screen 08/04/2019  What Year? 0 points  What month? 0 points  What time? 0 points  Count back from 20 0 points  Months in reverse 0 points  Repeat phrase 2 points  Total Score 2   (Normal:0-7, Significant for Dysfunction: >8)  Normal Cognitive Function Screening: Yes   Immunization & Health Maintenance Record Immunization History  Administered Date(s) Administered  . Influenza, High Dose Seasonal PF 04/03/2016, 04/02/2017  . Influenza,inj,Quad PF,6+ Mos 02/24/2013, 03/16/2014, 03/29/2015, 03/18/2019  . Influenza-Unspecified 03/13/2018  . Moderna SARS-COVID-2 Vaccination 07/01/2019, 07/29/2019  . Pneumococcal Conjugate-13 09/17/2014  . Pneumococcal Polysaccharide-23 06/06/1999  . Tdap 03/06/2011  . Zoster 04/06/2007  . Zoster Recombinat (Shingrix) 08/12/2018, 11/12/2018    Health Maintenance  Topic Date Due  . TETANUS/TDAP  03/14/2021  . INFLUENZA VACCINE  Completed  . PNA vac Low Risk Adult  Completed       Assessment  This is a routine wellness examination for Maijor Hornig Hinkson.  Health Maintenance: Due or Overdue There are no preventive care reminders to display for this patient.  Frank Cook does not need a referral for [employer] Assistance: Care Management:   no Social Work:    no Prescription Assistance:  no Nutrition/Diabetes Education:  no   Plan:  Personalized Goals Goals Addressed            This Visit's Progress   . awv       08/04/2019 AWV Goal: Exercise for General Health   Patient will verbalize understanding of the benefits of increased physical activity:  Exercising regularly is important. It will improve your overall fitness, flexibility, and endurance.  Regular exercise also will improve your overall health. It can help you control your weight, reduce stress, and improve your bone density.  Over the next year, patient will increase physical activity as tolerated with a goal of at least 150 minutes of moderate physical activity per week.    You can tell that you are exercising at a moderate intensity if your heart  starts beating faster and you start breathing faster but can still hold a conversation.  Moderate-intensity exercise ideas include:  Walking 1 mile (1.6 km) in about 15 minutes  Biking  Hiking  Golfing  Dancing  Water aerobics  Patient will verbalize understanding of everyday activities that increase physical activity by providing examples like the following: ? Yard work, such as: ? Pushing a Surveyor, mining ? Raking and bagging leaves ? Washing your car ? Pushing a stroller ? Shoveling snow ? Gardening ? Washing windows or floors  Patient will be able to explain general safety guidelines for exercising:   Before you start a new exercise program, talk with your health care provider.  Do not exercise so much that you hurt yourself, feel dizzy, or get very short of breath.  Wear comfortable clothes and wear shoes with good support.  Drink plenty of water while you exercise to prevent dehydration or heat stroke.  Work out until your breathing and your heartbeat get faster.       Personalized Health Maintenance & Screening Recommendations  up to date  Lung Cancer Screening Recommended: no (Low Dose CT Chest recommended if Age 77-80 years, 30 pack-year currently smoking OR have quit w/in past 15 years) Hepatitis C Screening recommended: no HIV Screening recommended: no  Advanced Directives: Written information was not prepared per patient's request.  Referrals & Orders No orders of the defined types were placed in this encounter.   Follow-up Plan . Follow-up with Frank Pierini, FNP as planned . Schedule your yearly eye exam. . Keep all cardiology follow ups. . Schedule your yearly hearing exam.    I have personally reviewed and noted the following in the patient's chart:   . Medical and social history . Use of alcohol, tobacco or illicit drugs  . Current medications and  supplements . Functional ability and status . Nutritional status . Physical activity . Advanced directives . List of other physicians . Hospitalizations, surgeries, and ER visits in previous 12 months . Vitals . Screenings to include cognitive, depression, and falls . Referrals and appointments  In addition, I have reviewed and discussed with Frank Cook certain preventive protocols, quality metrics, and best practice recommendations. A written personalized care plan for preventive services as well as general preventive health recommendations is available and can be mailed to the patient at his request.      Billee Cashing, LPN     [id number]

## 2019-08-29 ENCOUNTER — Telehealth: Payer: Self-pay | Admitting: Nurse Practitioner

## 2019-08-29 NOTE — Chronic Care Management (AMB) (Signed)
  Chronic Care Management   Note  08/29/2019 Name: MATAS BURROWS MRN: [id number] DOB: 05-Sep-1931  Lambros Cerro Brownlee is a 86 y.o. year old male who is a primary care patient of Chevis Pretty, FNP. I reached out to [place] Charon Sprinkle by phone today in response to a referral sent by Mr. Bless Belshe Tribby's health plan.     Mr. Lycan was given information about Chronic Care Management services today including:  1. CCM service includes personalized support from designated clinical staff supervised by his physician, including individualized plan of care and coordination with other care providers 2. 24/7 contact phone numbers for assistance for urgent and routine care needs. 3. Service will only be billed when office clinical staff spend 20 minutes or more in a month to coordinate care. 4. Only one practitioner may furnish and bill the service in a calendar month. 5. The patient may stop CCM services at any time (effective at the end of the month) by phone call to the office staff. 6. The patient will be responsible for cost sharing (co-pay) of up to 20% of the service fee (after annual deductible is met).  Patient agreed to services and verbal consent obtained.   Follow up plan: Telephone appointment with care management team member scheduled for:04/01/2020.  [place], [place] [id number] Direct Dial: [phone] [email or web address]: [email or web address]

## 2019-10-30 ENCOUNTER — Encounter: Payer: Self-pay | Admitting: Nurse Practitioner

## 2019-10-30 ENCOUNTER — Ambulatory Visit (INDEPENDENT_AMBULATORY_CARE_PROVIDER_SITE_OTHER): Payer: Medicare Other | Admitting: Nurse Practitioner

## 2019-10-30 ENCOUNTER — Other Ambulatory Visit: Payer: Self-pay

## 2019-10-30 VITALS — BP 139/87 | HR 84 | Temp 97.7°F | Resp 20 | Ht 70.0 in | Wt 191.0 lb

## 2019-10-30 DIAGNOSIS — E782 Mixed hyperlipidemia: Secondary | ICD-10-CM | POA: Diagnosis not present

## 2019-10-30 DIAGNOSIS — Z6827 Body mass index (BMI) 27.0-27.9, adult: Secondary | ICD-10-CM

## 2019-10-30 DIAGNOSIS — I70213 Atherosclerosis of native arteries of extremities with intermittent claudication, bilateral legs: Secondary | ICD-10-CM | POA: Diagnosis not present

## 2019-10-30 DIAGNOSIS — R29898 Other symptoms and signs involving the musculoskeletal system: Secondary | ICD-10-CM | POA: Diagnosis not present

## 2019-10-30 DIAGNOSIS — I739 Peripheral vascular disease, unspecified: Secondary | ICD-10-CM

## 2019-10-30 DIAGNOSIS — I4811 Longstanding persistent atrial fibrillation: Secondary | ICD-10-CM

## 2019-10-30 DIAGNOSIS — I10 Essential (primary) hypertension: Secondary | ICD-10-CM

## 2019-10-30 DIAGNOSIS — K219 Gastro-esophageal reflux disease without esophagitis: Secondary | ICD-10-CM | POA: Diagnosis not present

## 2019-10-30 DIAGNOSIS — E559 Vitamin D deficiency, unspecified: Secondary | ICD-10-CM

## 2019-10-30 MED ORDER — SIMVASTATIN 20 MG PO TABS: 20.0000 mg | ORAL_TABLET | Freq: Every day | ORAL | 1 refills | Status: DC

## 2019-10-30 MED ORDER — CILOSTAZOL 100 MG PO TABS: 100.0000 mg | ORAL_TABLET | Freq: Two times a day (BID) | ORAL | 3 refills | Status: DC

## 2019-10-30 MED ORDER — METOPROLOL SUCCINATE ER 100 MG PO TB24: 100.0000 mg | ORAL_TABLET | Freq: Every day | ORAL | 1 refills | Status: DC

## 2019-10-30 MED ORDER — QUINAPRIL HCL 40 MG PO TABS: ORAL_TABLET | ORAL | 1 refills | Status: DC

## 2019-10-30 MED ORDER — OMEPRAZOLE 20 MG PO CPDR: 20.0000 mg | DELAYED_RELEASE_CAPSULE | Freq: Every day | ORAL | 1 refills | Status: DC

## 2019-10-30 MED ORDER — HYDROCHLOROTHIAZIDE 25 MG PO TABS: 25.0000 mg | ORAL_TABLET | Freq: Every day | ORAL | 1 refills | Status: DC

## 2019-10-30 MED ORDER — APIXABAN 2.5 MG PO TABS: ORAL_TABLET | ORAL | 1 refills | Status: DC

## 2019-10-30 NOTE — Patient Instructions (Signed)

## 2019-10-30 NOTE — Progress Notes (Signed)
Subjective:    Patient ID: Frank Cook, male    DOB: 02/24/1932, 86 y.o.   MRN: [id number]   Chief Complaint: Medical Management of Chronic Issues    HPI:  1. HYPERTENSION, BENIGN No c/o chest pain, sob or headache. Does not check blood pressure at home. BP Readings from Last 3 Encounters:  10/30/19 139/87  07/30/19 129/69  02/19/19 140/82    2. Mixed hyperlipidemia Tries to watch diet but I not able to do any exercise due lower ext weakness. Lab Results  Component Value Date   CHOL 119 07/30/2019   HDL 46 07/30/2019   LDLCALC 55 07/30/2019   TRIG 98 07/30/2019   CHOLHDL 2.6 07/30/2019     3. Longstanding persistent atrial fibrillation (HCC) Denies any palpitation  4. PVD (peripheral vascular disease) (HCC) denies nay lower ext pain, numbness or tingling  5. Gastroesophageal reflux disease without esophagitis Is on omeprazole dailyy  6. Vitamin D deficiency Take daily vitamin d supplement  7. BMI 27.0-27.9,adult No recent weight changes Wt Readings from Last 3 Encounters:  10/30/19 191 lb (86.6 kg)  07/30/19 190 lb 4 oz (86.3 kg)  02/19/19 189 lb (85.7 kg)   BMI Readings from Last 3 Encounters:  10/30/19 27.41 kg/m  07/30/19 27.30 kg/m  02/19/19 27.12 kg/m       Outpatient Encounter Medications as of 10/30/2019  Medication Sig  . apixaban (ELIQUIS) 2.5 MG TABS tablet Take 1 tablet (2.5 mg total) by mouth 2 (two) times daily.  . Cholecalciferol (VITAMIN D3) 5000 units CAPS Take 5,000 Units by mouth daily.  . Glucosamine 500 MG CAPS Take 1,500 mg by mouth daily.  . hydrochlorothiazide (HYDRODIURIL) 25 MG tablet Take 1 tablet (25 mg total) by mouth daily.  . metoprolol succinate (TOPROL-XL) 100 MG 24 hr tablet Take 1 tablet (100 mg total) by mouth daily. Take with or immediately following a meal.  . Omega 3 1000 MG CAPS Take 2,000 mg by mouth daily.  Marland Kitchen omeprazole (PRILOSEC) 20 MG capsule Take 1 capsule (20 mg total) by mouth daily.  . quinapril  (ACCUPRIL) 40 MG tablet TAKE 1 & 1/2 TABLETS ONCE DAILY  . simvastatin (ZOCOR) 20 MG tablet Take 1 tablet (20 mg total) by mouth daily.     Past Surgical History:  Procedure Laterality Date  . COLONOSCOPY    . None      Family History  Problem Relation Age of Onset  . COPD Mother   . Cancer Father        Bone  . Cancer Brother        Bone  . Stroke Maternal Grandfather   . Arthritis Brother   . Hypertension Brother     New complaints: Just seem to be worsening lower ext weakness. Has pian in legs when he I trying to walkinter  Social history: Lives alone- his wife is on nursing home and he has not been able to see her lately because ehe is to weak to walk in facility to see her.  Controlled substance contract: n/a    Review of Systems  Constitutional: Negative for diaphoresis.  Eyes: Negative for pain.  Respiratory: Negative for shortness of breath.   Cardiovascular: Negative for chest pain, palpitations and leg swelling.  Gastrointestinal: Negative for abdominal pain.  Endocrine: Negative for polydipsia.  Skin: Negative for rash.  Neurological: Negative for dizziness, weakness and headaches.  Hematological: Does not bruise/bleed easily.  All other systems reviewed and are negative.  Objective:   Physical Exam Vitals and nursing note reviewed.  Constitutional:      Appearance: Normal appearance. He is well-developed.  HENT:     Head: Normocephalic.     Nose: Nose normal.  Eyes:     Pupils: Pupils are equal, round, and reactive to light.  Neck:     Thyroid: No thyroid mass or thyromegaly.     Vascular: No carotid bruit or JVD.     Trachea: Phonation normal.  Cardiovascular:     Rate and Rhythm: Normal rate and regular rhythm.  Pulmonary:     Effort: Pulmonary effort is normal. No respiratory distress.     Breath sounds: Normal breath sounds.  Abdominal:     General: Bowel sounds are normal.     Palpations: Abdomen is soft.     Tenderness: There  is no abdominal tenderness.  Musculoskeletal:        General: Normal range of motion.     Cervical back: Normal range of motion and neck supple.  Lymphadenopathy:     Cervical: No cervical adenopathy.  Skin:    General: Skin is warm and dry.  Neurological:     Mental Status: He is alert and oriented to person, place, and time.  Psychiatric:        Behavior: Behavior normal.        Thought Content: Thought content normal.        Judgment: Judgment normal.    BP 139/87   Pulse 84   Temp 97.7 F (36.5 C) (Temporal)   Resp 20   Ht 5\' 10"  (1.778 m)   Wt 191 lb (86.6 kg)   SpO2 98%   BMI 27.41 kg/m         Assessment & Plan:  Zorian Gunderman Posadas comes in today with chief complaint of Medical Management of Chronic Issues   Diagnosis and orders addressed:  1. HYPERTENSION, BENIGN Low sodium diet - hydrochlorothiazide (HYDRODIURIL) 25 MG tablet; Take 1 tablet (25 mg total) by mouth daily.  Dispense: 90 tablet; Refill: 1 - metoprolol succinate (TOPROL-XL) 100 MG 24 hr tablet; Take 1 tablet (100 mg total) by mouth daily. Take with or immediately following a meal.  Dispense: 90 tablet; Refill: 1 - quinapril (ACCUPRIL) 40 MG tablet; TAKE 1 & 1/2 TABLETS ONCE DAILY  Dispense: 135 tablet; Refill: 1  2. Mixed hyperlipidemia Low fat diet - simvastatin (ZOCOR) 20 MG tablet; Take 1 tablet (20 mg total) by mouth daily.  Dispense: 90 tablet; Refill: 1  3. Longstanding persistent atrial fibrillation (HCC) Avoid caffeine - apixaban (ELIQUIS) 2.5 MG TABS tablet; Take 1 tablet (2.5 mg total) by mouth 2 (two) times daily.  Dispense: 180 tablet; Refill: 1  4. PVD (peripheral vascular disease) ([place])  5. Gastroesophageal reflux disease without esophagitis Avoid spicy foods Do not eat 2 hours prior to bedtime - omeprazole (PRILOSEC) 20 MG capsule; Take 1 capsule (20 mg total) by mouth daily.  Dispense: 90 capsule; Refill: 1  6. Vitamin D deficiency Continue daiy vitamin d supplement  7. BMI  27.0-27.9,adult Discussed diet and exercise for person with BMI >25 Will recheck weight in 3-6 months  8. Weakness of both lower extremities Fall prevention - Ambulatory referral to [facility]  9. Intermittent claudication of both lower extremities due to atherosclerosis ([place]) Will try pletal and ee if help - cilostazol (PLETAL) 100 MG tablet; Take 1 tablet (100 mg total) by mouth 2 (two) times daily.  Dispense: 60 tablet; Refill: 3  Labs pending Health Maintenance reviewed Diet and exercise encouraged  Follow up plan: 3 months   Mary-Margaret Hassell Done, FNP

## 2019-10-31 LAB — CMP14+EGFR
ALT: 15 IU/L (ref 0–44)
AST: 27 IU/L (ref 0–40)
Albumin/Globulin Ratio: 1.5 (ref 1.2–2.2)
Albumin: 4.3 g/dL (ref 3.6–4.6)
Alkaline Phosphatase: 47 IU/L — ABNORMAL LOW (ref 48–121)
BUN/Creatinine Ratio: 12 (ref 10–24)
BUN: 18 mg/dL (ref 8–27)
Bilirubin Total: 0.6 mg/dL (ref 0.0–1.2)
CO2: 21 mmol/L (ref 20–29)
Calcium: 10 mg/dL (ref 8.6–10.2)
Chloride: 102 mmol/L (ref 96–106)
Creatinine, Ser: 1.46 mg/dL — ABNORMAL HIGH (ref 0.76–1.27)
GFR calc Af Amer: 50 mL/min/{1.73_m2} — ABNORMAL LOW (ref >59)
GFR calc non Af Amer: 43 mL/min/{1.73_m2} — ABNORMAL LOW (ref >59)
Globulin, Total: 2.8 g/dL (ref 1.5–4.5)
Glucose: 91 mg/dL (ref 65–99)
Potassium: 4.2 mmol/L (ref 3.5–5.2)
Sodium: 141 mmol/L (ref 134–144)
Total Protein: 7.1 g/dL (ref 6.0–8.5)

## 2019-10-31 LAB — CBC WITH DIFFERENTIAL/PLATELET
Basophils Absolute: 0 10*3/uL (ref 0.0–0.2)
Basos: 1 %
EOS (ABSOLUTE): 0.1 10*3/uL (ref 0.0–0.4)
Eos: 2 %
Hematocrit: 43.4 % (ref 37.5–51.0)
Hemoglobin: 14.4 g/dL (ref 13.0–17.7)
Immature Grans (Abs): 0 10*3/uL (ref 0.0–0.1)
Immature Granulocytes: 0 %
Lymphocytes Absolute: 1.5 10*3/uL (ref 0.7–3.1)
Lymphs: 24 %
MCH: 29.3 pg (ref 26.6–33.0)
MCHC: 33.2 g/dL (ref 31.5–35.7)
MCV: 88 fL (ref 79–97)
Monocytes Absolute: 0.5 10*3/uL (ref 0.1–0.9)
Monocytes: 8 %
Neutrophils Absolute: 4 10*3/uL (ref 1.4–7.0)
Neutrophils: 65 %
Platelets: 205 10*3/uL (ref 150–450)
RBC: 4.92 x10E6/uL (ref 4.14–5.80)
RDW: 14.4 % (ref 11.6–15.4)
WBC: 6.2 10*3/uL (ref 3.4–10.8)

## 2019-10-31 LAB — LIPID PANEL
Chol/HDL Ratio: 2.7 ratio (ref 0.0–5.0)
Cholesterol, Total: 120 mg/dL (ref 100–199)
HDL: 45 mg/dL (ref >39)
LDL Chol Calc (NIH): 55 mg/dL (ref 0–99)
Triglycerides: 108 mg/dL (ref 0–149)
VLDL Cholesterol Cal: 20 mg/dL (ref 5–40)

## 2019-11-10 DIAGNOSIS — I10 Essential (primary) hypertension: Secondary | ICD-10-CM | POA: Diagnosis not present

## 2019-11-20 ENCOUNTER — Other Ambulatory Visit: Payer: Self-pay

## 2019-11-20 ENCOUNTER — Ambulatory Visit (INDEPENDENT_AMBULATORY_CARE_PROVIDER_SITE_OTHER): Payer: Medicare Other

## 2019-11-20 DIAGNOSIS — I4811 Longstanding persistent atrial fibrillation: Secondary | ICD-10-CM | POA: Diagnosis not present

## 2019-11-20 DIAGNOSIS — I70213 Atherosclerosis of native arteries of extremities with intermittent claudication, bilateral legs: Secondary | ICD-10-CM

## 2019-11-20 DIAGNOSIS — I10 Essential (primary) hypertension: Secondary | ICD-10-CM

## 2019-11-20 DIAGNOSIS — E782 Mixed hyperlipidemia: Secondary | ICD-10-CM | POA: Diagnosis not present

## 2019-11-20 DIAGNOSIS — Z7901 Long term (current) use of anticoagulants: Secondary | ICD-10-CM

## 2019-11-20 DIAGNOSIS — Z9181 History of falling: Secondary | ICD-10-CM

## 2019-11-20 DIAGNOSIS — E559 Vitamin D deficiency, unspecified: Secondary | ICD-10-CM

## 2019-11-20 DIAGNOSIS — K219 Gastro-esophageal reflux disease without esophagitis: Secondary | ICD-10-CM

## 2019-12-10 DIAGNOSIS — E559 Vitamin D deficiency, unspecified: Secondary | ICD-10-CM | POA: Diagnosis not present

## 2019-12-10 DIAGNOSIS — I4811 Longstanding persistent atrial fibrillation: Secondary | ICD-10-CM | POA: Diagnosis not present

## 2019-12-10 DIAGNOSIS — Z9181 History of falling: Secondary | ICD-10-CM | POA: Diagnosis not present

## 2019-12-10 DIAGNOSIS — I10 Essential (primary) hypertension: Secondary | ICD-10-CM | POA: Diagnosis not present

## 2019-12-10 DIAGNOSIS — I70213 Atherosclerosis of native arteries of extremities with intermittent claudication, bilateral legs: Secondary | ICD-10-CM | POA: Diagnosis not present

## 2019-12-10 DIAGNOSIS — K219 Gastro-esophageal reflux disease without esophagitis: Secondary | ICD-10-CM | POA: Diagnosis not present

## 2019-12-10 DIAGNOSIS — Z7901 Long term (current) use of anticoagulants: Secondary | ICD-10-CM | POA: Diagnosis not present

## 2019-12-10 DIAGNOSIS — E782 Mixed hyperlipidemia: Secondary | ICD-10-CM | POA: Diagnosis not present

## 2019-12-17 DIAGNOSIS — E559 Vitamin D deficiency, unspecified: Secondary | ICD-10-CM | POA: Diagnosis not present

## 2019-12-17 DIAGNOSIS — E782 Mixed hyperlipidemia: Secondary | ICD-10-CM | POA: Diagnosis not present

## 2019-12-17 DIAGNOSIS — I10 Essential (primary) hypertension: Secondary | ICD-10-CM | POA: Diagnosis not present

## 2019-12-17 DIAGNOSIS — K219 Gastro-esophageal reflux disease without esophagitis: Secondary | ICD-10-CM | POA: Diagnosis not present

## 2019-12-17 DIAGNOSIS — I4811 Longstanding persistent atrial fibrillation: Secondary | ICD-10-CM | POA: Diagnosis not present

## 2019-12-17 DIAGNOSIS — I70213 Atherosclerosis of native arteries of extremities with intermittent claudication, bilateral legs: Secondary | ICD-10-CM | POA: Diagnosis not present

## 2019-12-24 DIAGNOSIS — I4811 Longstanding persistent atrial fibrillation: Secondary | ICD-10-CM | POA: Diagnosis not present

## 2019-12-24 DIAGNOSIS — I70213 Atherosclerosis of native arteries of extremities with intermittent claudication, bilateral legs: Secondary | ICD-10-CM | POA: Diagnosis not present

## 2019-12-24 DIAGNOSIS — K219 Gastro-esophageal reflux disease without esophagitis: Secondary | ICD-10-CM | POA: Diagnosis not present

## 2019-12-24 DIAGNOSIS — E782 Mixed hyperlipidemia: Secondary | ICD-10-CM | POA: Diagnosis not present

## 2019-12-24 DIAGNOSIS — I10 Essential (primary) hypertension: Secondary | ICD-10-CM | POA: Diagnosis not present

## 2019-12-24 DIAGNOSIS — E559 Vitamin D deficiency, unspecified: Secondary | ICD-10-CM | POA: Diagnosis not present

## 2019-12-31 DIAGNOSIS — I10 Essential (primary) hypertension: Secondary | ICD-10-CM | POA: Diagnosis not present

## 2019-12-31 DIAGNOSIS — E559 Vitamin D deficiency, unspecified: Secondary | ICD-10-CM | POA: Diagnosis not present

## 2019-12-31 DIAGNOSIS — K219 Gastro-esophageal reflux disease without esophagitis: Secondary | ICD-10-CM | POA: Diagnosis not present

## 2019-12-31 DIAGNOSIS — I70213 Atherosclerosis of native arteries of extremities with intermittent claudication, bilateral legs: Secondary | ICD-10-CM | POA: Diagnosis not present

## 2019-12-31 DIAGNOSIS — I4811 Longstanding persistent atrial fibrillation: Secondary | ICD-10-CM | POA: Diagnosis not present

## 2019-12-31 DIAGNOSIS — E782 Mixed hyperlipidemia: Secondary | ICD-10-CM | POA: Diagnosis not present

## 2020-02-23 ENCOUNTER — Other Ambulatory Visit: Payer: Self-pay | Admitting: Nurse Practitioner

## 2020-02-23 DIAGNOSIS — I70213 Atherosclerosis of native arteries of extremities with intermittent claudication, bilateral legs: Secondary | ICD-10-CM

## 2020-03-03 ENCOUNTER — Encounter: Payer: Self-pay | Admitting: Cardiology

## 2020-03-03 ENCOUNTER — Ambulatory Visit (INDEPENDENT_AMBULATORY_CARE_PROVIDER_SITE_OTHER): Payer: Medicare Other | Admitting: Cardiology

## 2020-03-03 ENCOUNTER — Other Ambulatory Visit: Payer: Self-pay

## 2020-03-03 VITALS — BP 124/90 | HR 90 | Ht 70.0 in | Wt 194.0 lb

## 2020-03-03 DIAGNOSIS — I70213 Atherosclerosis of native arteries of extremities with intermittent claudication, bilateral legs: Secondary | ICD-10-CM

## 2020-03-03 DIAGNOSIS — I10 Essential (primary) hypertension: Secondary | ICD-10-CM | POA: Diagnosis not present

## 2020-03-03 DIAGNOSIS — Z7189 Other specified counseling: Secondary | ICD-10-CM | POA: Insufficient documentation

## 2020-03-03 DIAGNOSIS — M79605 Pain in left leg: Secondary | ICD-10-CM

## 2020-03-03 DIAGNOSIS — I4811 Longstanding persistent atrial fibrillation: Secondary | ICD-10-CM

## 2020-03-03 DIAGNOSIS — M79604 Pain in right leg: Secondary | ICD-10-CM | POA: Diagnosis not present

## 2020-03-03 NOTE — Progress Notes (Signed)
Cardiology Office Note   Date:  03/03/2020   ID:  Hensley, Treat 1932-04-30, MRN [id number]  PCP:  Bennie Pierini, FNP  Cardiologist:   No primary care provider on file.   Chief Complaint  Patient presents with  . Leg Pain      History of Present Illness: Frank Cook is a 86 y.o. male who presents for follow-up of atrial fibrillation.  His wife lives in a nursing home.  She actually had to be transported to [place] early during the pandemic because she got Covid.  She is back up here in the nursing home.  He goes to see her.  He gets around very slowly because he has had progressive weakness and pain in his bilateral calves.  He is actually been treated with Pletal without improvement.  He had physical therapy at home without improvement.  He says the pain happens when he walks or sometimes when he stands.  He has never had any work-up of this.  He has never had a neurologic or peripheral vascular disease diagnosis.  He is in atrial fibrillation.  He does not notice this.  He denies any palpitations, presyncope or syncope.  He has no chest pressure, neck or arm discomfort.  He has some chronic lower extremity swelling.   Past Medical History:  Diagnosis Date  . Atrial fibrillation (HCC)   . Colon polyps   . GERD (gastroesophageal reflux disease)   . Hyperlipidemia   . Hypertension   . Osteopenia     Past Surgical History:  Procedure Laterality Date  . COLONOSCOPY    . None       Current Outpatient Medications  Medication Sig Dispense Refill  . apixaban (ELIQUIS) 2.5 MG TABS tablet Take 1 tablet (2.5 mg total) by mouth 2 (two) times daily. 180 tablet 1  . Cholecalciferol (VITAMIN D3) 5000 units CAPS Take 5,000 Units by mouth daily.    . cilostazol (PLETAL) 100 MG tablet TAKE  (1)  TABLET TWICE A DAY. 180 tablet 0  . Glucosamine 500 MG CAPS Take 1,500 mg by mouth daily.    . hydrochlorothiazide (HYDRODIURIL) 25 MG tablet Take 1 tablet (25 mg total) by  mouth daily. 90 tablet 1  . metoprolol succinate (TOPROL-XL) 100 MG 24 hr tablet Take 1 tablet (100 mg total) by mouth daily. Take with or immediately following a meal. 90 tablet 1  . Omega 3 1000 MG CAPS Take 2,000 mg by mouth daily.    Marland Kitchen omeprazole (PRILOSEC) 20 MG capsule Take 1 capsule (20 mg total) by mouth daily. 90 capsule 1  . quinapril (ACCUPRIL) 40 MG tablet TAKE 1 & 1/2 TABLETS ONCE DAILY 135 tablet 1  . simvastatin (ZOCOR) 20 MG tablet Take 1 tablet (20 mg total) by mouth daily. 90 tablet 1   No current facility-administered medications for this visit.    Allergies:   Patient has no known allergies.    ROS:  Please see the history of present illness.   Otherwise, review of systems are positive for none.   All other systems are reviewed and negative.    PHYSICAL EXAM: VS:  BP 124/90   Pulse 90   Ht 5\' 10"  (1.778 m)   Wt 194 lb (88 kg)   BMI 27.84 kg/m  , BMI Body mass index is 27.84 kg/m. GENERAL:  Well appearing NECK:  No jugular venous distention, waveform within normal limits, carotid upstroke brisk and symmetric, no bruits, no thyromegaly LUNGS:  Clear to auscultation bilaterally CHEST:  Unremarkable HEART:  PMI not displaced or sustained,S1 and S2 within normal limits, no S3,  no clicks, no rubs, no murmurs, irregular ABD:  Flat, positive bowel sounds normal in frequency in pitch, no bruits, no rebound, no guarding, no midline pulsatile mass, no hepatomegaly, no splenomegaly EXT:  2 plus pulses upper and decreased dorsalis pedis and posterior tibialis bilateral, mild edema, no cyanosis no clubbing  EKG:  EKG is ordered today. The ekg ordered today demonstrates atrial fibrillation, rate 90, right bundle branch block, no acute ST-T wave changes, no change from previous.   Recent Labs: 10/30/2019: ALT 15; BUN 18; Creatinine, Ser 1.46; Hemoglobin 14.4; Platelets 205; Potassium 4.2; Sodium 141    Lipid Panel    Component Value Date/Time   CHOL 120 10/30/2019 1146     CHOL 135 10/24/2012 1056   TRIG 108 10/30/2019 1146   TRIG 112 09/17/2014 1141   TRIG 182 (H) 10/24/2012 1056   HDL 45 10/30/2019 1146   HDL 56 09/17/2014 1141   HDL 49 10/24/2012 1056   CHOLHDL 2.7 10/30/2019 1146   LDLCALC 55 10/30/2019 1146   LDLCALC 42 03/16/2014 1101   LDLCALC 50 10/24/2012 1056      Wt Readings from Last 3 Encounters:  03/03/20 194 lb (88 kg)  10/30/19 191 lb (86.6 kg)  07/30/19 190 lb 4 oz (86.3 kg)      Other studies Reviewed: Additional studies/ records that were reviewed today include: Labs. Review of the above records demonstrates:  Please see elsewhere in the note.     ASSESSMENT AND PLAN:  ATRIAL FIB:  Mr. Frank Cook has a CHA2DS2 - VASc score of 3.  He tolerates rate control with anticoagulation as well.  No change in therapy.  HTN: His blood pressure is at target.  No change in therapy.  CKD: His creatinine hovers around 1.5 and I did review this blood work today.  He is a little bit lower but I still think that 2.5 mg twice a day of Eliquis is the right dose.  LEG PAIN: He has reduced pulses.  I will check ABIs.  COVID VACCINE: He has had his vaccine.  Current medicines are reviewed at length with the patient today.  The patient does not have concerns regarding medicines.  The following changes have been made:  no change  Labs/ tests ordered today include:   Orders Placed This Encounter  Procedures  . EKG 12-Lead  . VAS [place] LOWER EXTREMITY ARTERIAL DUPLEX     Disposition:   FU with me in 1 year   Signed, Rollene Rotunda, MD  03/03/2020 4:59 PM    [facility]

## 2020-03-03 NOTE — Patient Instructions (Signed)
Medication Instructions:  The current medical regimen is effective;  continue present plan and medications.  *If you need a refill on your cardiac medications before your next appointment, please call your pharmacy*  Testing/Procedures: Your physician has requested that you have a lower extremity arterial exercise duplex. During this test, exercise and ultrasound are used to evaluate arterial blood flow in the legs. Allow one hour for this exam. There are no restrictions or special instructions.  Follow-Up: At [facility], you and your health needs are our priority.  As part of our continuing mission to provide you with exceptional heart care, we have created designated Provider Care Teams.  These Care Teams include your primary Cardiologist (physician) and Advanced Practice Providers (APPs -  Physician Assistants and Nurse Practitioners) who all work together to provide you with the care you need, when you need it.  We recommend signing up for the patient portal called "MyChart".  Sign up information is provided on this After Visit Summary.  MyChart is used to connect with patients for Virtual Visits (Telemedicine).  Patients are able to view lab/test results, encounter notes, upcoming appointments, etc.  Non-urgent messages can be sent to your provider as well.   To learn more about what you can do with MyChart, go to [email or web address].    Your next appointment:   12 month(s)  The format for your next appointment:   In Person  Provider:   Rollene Rotunda, MD   Thank you for choosing [facility]!!

## 2020-03-04 ENCOUNTER — Other Ambulatory Visit: Payer: Self-pay | Admitting: *Deleted

## 2020-03-04 DIAGNOSIS — M79605 Pain in left leg: Secondary | ICD-10-CM

## 2020-03-04 NOTE — Addendum Note (Signed)
Addended by: Sharin Grave on: 03/04/2020 12:06 PM   Modules accepted: Orders

## 2020-03-05 ENCOUNTER — Encounter: Payer: Self-pay | Admitting: Nurse Practitioner

## 2020-03-05 ENCOUNTER — Other Ambulatory Visit: Payer: Self-pay

## 2020-03-05 ENCOUNTER — Ambulatory Visit (INDEPENDENT_AMBULATORY_CARE_PROVIDER_SITE_OTHER): Payer: Medicare Other | Admitting: Nurse Practitioner

## 2020-03-05 VITALS — BP 133/79 | HR 82 | Temp 97.1°F | Ht 70.0 in | Wt 192.8 lb

## 2020-03-05 DIAGNOSIS — I4811 Longstanding persistent atrial fibrillation: Secondary | ICD-10-CM | POA: Diagnosis not present

## 2020-03-05 DIAGNOSIS — K219 Gastro-esophageal reflux disease without esophagitis: Secondary | ICD-10-CM | POA: Diagnosis not present

## 2020-03-05 DIAGNOSIS — Z23 Encounter for immunization: Secondary | ICD-10-CM

## 2020-03-05 DIAGNOSIS — I70213 Atherosclerosis of native arteries of extremities with intermittent claudication, bilateral legs: Secondary | ICD-10-CM | POA: Diagnosis not present

## 2020-03-05 DIAGNOSIS — I10 Essential (primary) hypertension: Secondary | ICD-10-CM

## 2020-03-05 DIAGNOSIS — Z6827 Body mass index (BMI) 27.0-27.9, adult: Secondary | ICD-10-CM

## 2020-03-05 DIAGNOSIS — E559 Vitamin D deficiency, unspecified: Secondary | ICD-10-CM

## 2020-03-05 DIAGNOSIS — E782 Mixed hyperlipidemia: Secondary | ICD-10-CM

## 2020-03-05 MED ORDER — CILOSTAZOL 100 MG PO TABS: ORAL_TABLET | ORAL | 1 refills | Status: DC

## 2020-03-05 MED ORDER — OMEPRAZOLE 20 MG PO CPDR: 20.0000 mg | DELAYED_RELEASE_CAPSULE | Freq: Every day | ORAL | 1 refills | Status: DC

## 2020-03-05 MED ORDER — QUINAPRIL HCL 40 MG PO TABS: ORAL_TABLET | ORAL | 1 refills | Status: DC

## 2020-03-05 MED ORDER — HYDROCHLOROTHIAZIDE 25 MG PO TABS: 25.0000 mg | ORAL_TABLET | Freq: Every day | ORAL | 1 refills | Status: DC

## 2020-03-05 MED ORDER — METOPROLOL SUCCINATE ER 100 MG PO TB24: 100.0000 mg | ORAL_TABLET | Freq: Every day | ORAL | 1 refills | Status: DC

## 2020-03-05 NOTE — Patient Instructions (Signed)

## 2020-03-05 NOTE — Progress Notes (Signed)
Subjective:    Patient ID: Frank Cook, male    DOB: 04/11/1932, 86 y.o.   MRN: [id number]   Chief Complaint: Medical Management of Chronic Issues    HPI:  1. HYPERTENSION, BENIGN No c/o chest pain, sob or headache. Does not check blood pressure at home. BP Readings from Last 3 Encounters:  03/05/20 133/79  03/03/20 124/90  10/30/19 139/87     2. Mixed hyperlipidemia Tries to watch diet. Is not able not do nay exercise Lab Results  Component Value Date   CHOL 120 10/30/2019   HDL 45 10/30/2019   LDLCALC 55 10/30/2019   TRIG 108 10/30/2019   CHOLHDL 2.7 10/30/2019     3. Longstanding persistent atrial fibrillation (HCC) Denies palpitations or feeling if heart racing. Is on eliquis without bleeding  4. Gastroesophageal reflux disease without esophagitis Is on omperrazole daily and is doing well.  5. Vitamin D deficiency Takes dialy vitain d supplement  6. BMI 27.0-27.9,adult No recent weight changes Wt Readings from Last 3 Encounters:  03/05/20 192 lb 12.8 oz (87.5 kg)  03/03/20 194 lb (88 kg)  10/30/19 191 lb (86.6 kg)   BMI Readings from Last 3 Encounters:  03/05/20 27.66 kg/m  03/03/20 27.84 kg/m  10/30/19 27.41 kg/m       Outpatient Encounter Medications as of 03/05/2020  Medication Sig  . apixaban (ELIQUIS) 2.5 MG TABS tablet Take 1 tablet (2.5 mg total) by mouth 2 (two) times daily.  . Cholecalciferol (VITAMIN D3) 5000 units CAPS Take 5,000 Units by mouth daily.  . cilostazol (PLETAL) 100 MG tablet TAKE  (1)  TABLET TWICE A DAY.  Marland Kitchen Glucosamine 500 MG CAPS Take 1,500 mg by mouth daily.  . hydrochlorothiazide (HYDRODIURIL) 25 MG tablet Take 1 tablet (25 mg total) by mouth daily.  . metoprolol succinate (TOPROL-XL) 100 MG 24 hr tablet Take 1 tablet (100 mg total) by mouth daily. Take with or immediately following a meal.  . Omega 3 1000 MG CAPS Take 2,000 mg by mouth daily.  Marland Kitchen omeprazole (PRILOSEC) 20 MG capsule Take 1 capsule (20 mg total) by  mouth daily.  . quinapril (ACCUPRIL) 40 MG tablet TAKE 1 & 1/2 TABLETS ONCE DAILY  . simvastatin (ZOCOR) 20 MG tablet Take 1 tablet (20 mg total) by mouth daily.     Past Surgical History:  Procedure Laterality Date  . COLONOSCOPY    . None      Family History  Problem Relation Age of Onset  . COPD Mother   . Cancer Father        Bone  . Cancer Brother        Bone  . Stroke Maternal Grandfather   . Arthritis Brother   . Hypertension Brother     New complaints: Having lower ext cramps. They have him set up to have arterial studies on bil lower ext.  Social history: Lives by hisself- his wife is in nursing home because he was no longer able to care for her. He visits her several times a week.  Controlled substance contract: n/a    Review of Systems  Constitutional: Negative for diaphoresis.  Eyes: Negative for pain.  Respiratory: Negative for shortness of breath.   Cardiovascular: Negative for chest pain, palpitations and leg swelling.  Gastrointestinal: Negative for abdominal pain.  Endocrine: Negative for polydipsia.  Skin: Negative for rash.  Neurological: Negative for dizziness, weakness and headaches.  Hematological: Does not bruise/bleed easily.  All other systems reviewed and are  negative.      Objective:   Physical Exam Vitals and nursing note reviewed.  Constitutional:      Appearance: Normal appearance. He is well-developed.  HENT:     Head: Normocephalic.     Nose: Nose normal.  Eyes:     Pupils: Pupils are equal, round, and reactive to light.  Neck:     Thyroid: No thyroid mass or thyromegaly.     Vascular: No carotid bruit or JVD.     Trachea: Phonation normal.  Cardiovascular:     Rate and Rhythm: Normal rate and regular rhythm.  Pulmonary:     Effort: Pulmonary effort is normal. No respiratory distress.     Breath sounds: Normal breath sounds.  Abdominal:     General: Bowel sounds are normal.     Palpations: Abdomen is soft.      Tenderness: There is no abdominal tenderness.  Musculoskeletal:        General: Normal range of motion.     Cervical back: Normal range of motion and neck supple.  Lymphadenopathy:     Cervical: No cervical adenopathy.  Skin:    General: Skin is warm and dry.  Neurological:     Mental Status: He is alert and oriented to person, place, and time.  Psychiatric:        Behavior: Behavior normal.        Thought Content: Thought content normal.        Judgment: Judgment normal.     BP 133/79   Pulse 82   Temp (!) 97.1 F (36.2 C) (Temporal)   Ht _0  (1.778 m)   Wt 192 lb 12.8 oz (87.5 kg)   BMI 27.66 kg/m        Assessment & Plan:  Chesky Heyer Dede comes in today with chief complaint of Medical Management of Chronic Issues   Diagnosis and orders addressed:  1. HYPERTENSION, BENIGN Low sodium diet - hydrochlorothiazide (HYDRODIURIL) 25 MG tablet; Take 1 tablet (25 mg total) by mouth daily.  Dispense: 90 tablet; Refill: 1 - metoprolol succinate (TOPROL-XL) 100 MG 24 hr tablet; Take 1 tablet (100 mg total) by mouth daily. Take with or immediately following a meal.  Dispense: 90 tablet; Refill: 1 - quinapril (ACCUPRIL) 40 MG tablet; TAKE 1 & 1/2 TABLETS ONCE DAILY  Dispense: 135 tablet; Refill: 1 - CBC with Differential/Platelet - CMP14+EGFR  2. Mixed hyperlipidemia Low fat diet - Lipid panel  3. Longstanding persistent atrial fibrillation (HCC) Avoid caffeine Continue eliquis  4. Gastroesophageal reflux disease without esophagitis Avoid spicy foods Do not eat 2 hours prior to bedtime - omeprazole (PRILOSEC) 20 MG capsule; Take 1 capsule (20 mg total) by mouth daily.  Dispense: 90 capsule; Refill: 1  5. Vitamin D deficiency Continue daily vitamin d supplement  6. BMI 27.0-27.9,adult Discussed diet and exercise for person with BMI >25 Will recheck weight in 3-6 months  7. Intermittent claudication of both lower extremities due to atherosclerosis ([place]) Having test  run next week - cilostazol (PLETAL) 100 MG tablet; TAKE  (1)  TABLET TWICE A DAY.  Dispense: 180 tablet; Refill: 1   Labs pending Health Maintenance reviewed Diet and exercise encouraged  Follow up plan: 3 months   Mary-Margaret Hassell Done, FNP

## 2020-03-06 LAB — CBC WITH DIFFERENTIAL/PLATELET
Basophils Absolute: 0.1 10*3/uL (ref 0.0–0.2)
Basos: 1 %
EOS (ABSOLUTE): 0.2 10*3/uL (ref 0.0–0.4)
Eos: 2 %
Hematocrit: 43 % (ref 37.5–51.0)
Hemoglobin: 14.1 g/dL (ref 13.0–17.7)
Immature Grans (Abs): 0.1 10*3/uL (ref 0.0–0.1)
Immature Granulocytes: 1 %
Lymphocytes Absolute: 1.4 10*3/uL (ref 0.7–3.1)
Lymphs: 23 %
MCH: 29.6 pg (ref 26.6–33.0)
MCHC: 32.8 g/dL (ref 31.5–35.7)
MCV: 90 fL (ref 79–97)
Monocytes Absolute: 0.4 10*3/uL (ref 0.1–0.9)
Monocytes: 6 %
Neutrophils Absolute: 4.2 10*3/uL (ref 1.4–7.0)
Neutrophils: 67 %
Platelets: 229 10*3/uL (ref 150–450)
RBC: 4.76 x10E6/uL (ref 4.14–5.80)
RDW: 13.4 % (ref 11.6–15.4)
WBC: 6.3 10*3/uL (ref 3.4–10.8)

## 2020-03-06 LAB — CMP14+EGFR
ALT: 15 IU/L (ref 0–44)
AST: 23 IU/L (ref 0–40)
Albumin/Globulin Ratio: 1.6 (ref 1.2–2.2)
Albumin: 4.4 g/dL (ref 3.6–4.6)
Alkaline Phosphatase: 42 IU/L — ABNORMAL LOW (ref 44–121)
BUN/Creatinine Ratio: 12 (ref 10–24)
BUN: 16 mg/dL (ref 8–27)
Bilirubin Total: 0.4 mg/dL (ref 0.0–1.2)
CO2: 26 mmol/L (ref 20–29)
Calcium: 10 mg/dL (ref 8.6–10.2)
Chloride: 103 mmol/L (ref 96–106)
Creatinine, Ser: 1.39 mg/dL — ABNORMAL HIGH (ref 0.76–1.27)
GFR calc Af Amer: 53 mL/min/{1.73_m2} — ABNORMAL LOW (ref >59)
GFR calc non Af Amer: 46 mL/min/{1.73_m2} — ABNORMAL LOW (ref >59)
Globulin, Total: 2.7 g/dL (ref 1.5–4.5)
Glucose: 100 mg/dL — ABNORMAL HIGH (ref 65–99)
Potassium: 4.4 mmol/L (ref 3.5–5.2)
Sodium: 143 mmol/L (ref 134–144)
Total Protein: 7.1 g/dL (ref 6.0–8.5)

## 2020-03-06 LAB — LIPID PANEL
Chol/HDL Ratio: 2.4 ratio (ref 0.0–5.0)
Cholesterol, Total: 133 mg/dL (ref 100–199)
HDL: 55 mg/dL (ref >39)
LDL Chol Calc (NIH): 58 mg/dL (ref 0–99)
Triglycerides: 112 mg/dL (ref 0–149)
VLDL Cholesterol Cal: 20 mg/dL (ref 5–40)

## 2020-03-16 ENCOUNTER — Other Ambulatory Visit: Payer: Self-pay

## 2020-03-16 ENCOUNTER — Ambulatory Visit (HOSPITAL_COMMUNITY)
Admission: RE | Admit: 2020-03-16 | Discharge: 2020-03-16 | Disposition: A | Discharge status: Home / Self Care | Payer: Medicare Other | Source: Ambulatory Visit | Attending: Cardiovascular Disease | Admitting: Cardiovascular Disease

## 2020-03-16 ENCOUNTER — Other Ambulatory Visit: Payer: Self-pay | Admitting: Cardiology

## 2020-03-16 DIAGNOSIS — R202 Paresthesia of skin: Secondary | ICD-10-CM | POA: Insufficient documentation

## 2020-03-16 DIAGNOSIS — M79604 Pain in right leg: Secondary | ICD-10-CM

## 2020-03-16 DIAGNOSIS — M79605 Pain in left leg: Secondary | ICD-10-CM | POA: Insufficient documentation

## 2020-03-16 DIAGNOSIS — R29898 Other symptoms and signs involving the musculoskeletal system: Secondary | ICD-10-CM | POA: Diagnosis not present

## 2020-03-16 DIAGNOSIS — R200 Anesthesia of skin: Secondary | ICD-10-CM | POA: Insufficient documentation

## 2020-03-26 ENCOUNTER — Telehealth: Payer: Self-pay | Admitting: Cardiology

## 2020-03-26 DIAGNOSIS — I739 Peripheral vascular disease, unspecified: Secondary | ICD-10-CM

## 2020-03-26 NOTE — Telephone Encounter (Signed)
Patient made aware of results and verbalized understanding.  Unable to exclude significant PVD with this study. Please order lower extremity arterial Dopplers. Call Mr. Zumbro with the results and send results to Bennie Pierini, FNP   Orders have been placed and message sent to scheduling.

## 2020-03-26 NOTE — Telephone Encounter (Signed)
Patient's niece returning call to schedule further testing, but there are no active orders for any tests.

## 2020-03-29 ENCOUNTER — Telehealth: Payer: Self-pay | Admitting: Cardiology

## 2020-03-29 NOTE — Telephone Encounter (Signed)
Carney Bern returning call from [place]. Carney Bern says she set appointment for the patient this morning at 10 am but received a voicemail at 10:30 to call back and set another appointment. Carney Bern wanted to make sure all appointments were scheduled. Nothing in active requests/recalls to be scheduled.

## 2020-03-29 NOTE — Telephone Encounter (Signed)
Spoke with niece, Carney Bern to schedule lower arterial dopplers ordered by Dr. Antoine Poche per 03/26/20 staff message from Faustino Congress, RN.  Scheduled 04/07/20 at 10:00 am here at [facility].

## 2020-03-31 ENCOUNTER — Other Ambulatory Visit: Payer: Self-pay

## 2020-03-31 ENCOUNTER — Ambulatory Visit (INDEPENDENT_AMBULATORY_CARE_PROVIDER_SITE_OTHER): Payer: Medicare Other

## 2020-03-31 DIAGNOSIS — Z23 Encounter for immunization: Secondary | ICD-10-CM

## 2020-04-01 ENCOUNTER — Ambulatory Visit: Payer: Medicare Other | Admitting: *Deleted

## 2020-04-01 DIAGNOSIS — I10 Essential (primary) hypertension: Secondary | ICD-10-CM

## 2020-04-01 DIAGNOSIS — I4811 Longstanding persistent atrial fibrillation: Secondary | ICD-10-CM

## 2020-04-01 DIAGNOSIS — I70213 Atherosclerosis of native arteries of extremities with intermittent claudication, bilateral legs: Secondary | ICD-10-CM

## 2020-04-01 NOTE — Chronic Care Management (AMB) (Signed)
Chronic Care Management   Initial Visit Note  04/01/2020 Name: Frank Cook MRN: [id number] DOB: July 18, 1931  Referred by: Bennie Pierini, FNP Reason for referral : Chronic Care Management (Initial Visit)   Frank Cook is a 86 y.o. year old male who is a primary care patient of Bennie Pierini, FNP. The CCM team was consulted for assistance with chronic disease management and care coordination needs related to HTN, HLD, Afib, claudication.  Review of patient status, including review of consultants reports, relevant laboratory and other test results, and collaboration with appropriate care team members and the patient's provider was performed as part of comprehensive patient evaluation and provision of chronic care management services.    Subjective: I spoke with Mr Kagel by telephone today regarding management of his chronic medical conditions. His main concern at this time is bilateral lower extremity pain with standing and walking.  SDOH (Social Determinants of Health) assessments performed: Yes See Care Plan activities for detailed interventions related to SDOH  SDOH Interventions     Most Recent Value  SDOH Interventions  Physical Activity Interventions Other (Comments)  [Handout on Chair Exercises]       Objective: Outpatient Encounter Medications as of 04/01/2020  Medication Sig  . apixaban (ELIQUIS) 2.5 MG TABS tablet Take 1 tablet (2.5 mg total) by mouth 2 (two) times daily.  . Cholecalciferol (VITAMIN D3) 5000 units CAPS Take 5,000 Units by mouth daily.  . cilostazol (PLETAL) 100 MG tablet TAKE  (1)  TABLET TWICE A DAY.  Marland Kitchen Glucosamine 500 MG CAPS Take 1,500 mg by mouth daily.  . hydrochlorothiazide (HYDRODIURIL) 25 MG tablet Take 1 tablet (25 mg total) by mouth daily.  . metoprolol succinate (TOPROL-XL) 100 MG 24 hr tablet Take 1 tablet (100 mg total) by mouth daily. Take with or immediately following a meal.  . Omega 3 1000 MG CAPS Take 2,000 mg by mouth  daily.  Marland Kitchen omeprazole (PRILOSEC) 20 MG capsule Take 1 capsule (20 mg total) by mouth daily.  . quinapril (ACCUPRIL) 40 MG tablet TAKE 1 & 1/2 TABLETS ONCE DAILY  . simvastatin (ZOCOR) 20 MG tablet Take 1 tablet (20 mg total) by mouth daily.   No facility-administered encounter medications on file as of 04/01/2020.    BP Readings from Last 3 Encounters:  03/05/20 133/79  03/03/20 124/90  10/30/19 139/87   Lab Results  Component Value Date   CHOL 133 03/05/2020   HDL 55 03/05/2020   LDLCALC 58 03/05/2020   TRIG 112 03/05/2020   CHOLHDL 2.4 03/05/2020      Goals Addressed            This Visit's Progress   . Chronic Disase Management Needs       CARE PLAN ENTRY (see longtitudinal plan of care for additional care plan information)  Current Barriers:  . Chronic Disease Management support, education, and care coordination needs related to HTN, HLD, Afib, claudication  Clinical Goal(s) related to HTN, HLD, Afib, claudication:  Over the next 60 days, patient will:  . Work with the care management team to address educational, disease management, and care coordination needs  . Begin or continue self health monitoring activities as directed today Measure and record blood pressure 3 times per week . Call provider office for new or worsened signs and symptoms Blood pressure findings outside established parameters . Call care management team with questions or concerns . Verbalize basic understanding of patient centered plan of care established today  Interventions related to  HTN, HLD, Afib, claudication:  . Evaluation of current treatment plans and patient's adherence to plan as established by provider . Assessed patient understanding of disease states . Assessed patient's education and care coordination needs . Provided disease specific education to patient  . Collaborated with appropriate clinical care team members regarding patient needs . Chart reviewed including recent office  notes, lab results, and imaging reports o Discussed LE venous doppler results . Reviewed and discussed upcoming appointments:  o LE arterial Doppler on 04/07/20 . Discussed mobility and ability to perform ADLs o Uses cane for ambulation o BLE pain when standing or walking o Drives locally . Discussed family/social support o Has help form niece and nephew with transportation outside of the local area and they help with IADLs as needed o Lives alone. Wife is in a SNF . Provided with RN Care Manager contact number and encouraged to reach out as needed . Follow-up appt with RN Care Manager scheduled for 05/11/20  Patient Self Care Activities related to HTN, HLD, Afib, claudication:  . Patient is unable to independently self-manage chronic health conditions  Initial goal documentation         Plan:  Telephone follow up appointment with care management team member scheduled for:05/11/20 with RN Care Manager  Demetrios Loll, BSN, RN-BC Embedded Chronic Care Manager [facility] Care Management Direct Dial: [phone]

## 2020-04-01 NOTE — Patient Instructions (Signed)
Visit Information  Goals Addressed            This Visit's Progress   . Chronic Disase Management Needs       CARE PLAN ENTRY (see longtitudinal plan of care for additional care plan information)  Current Barriers:  . Chronic Disease Management support, education, and care coordination needs related to HTN, HLD, Afib, claudication  Clinical Goal(s) related to HTN, HLD, Afib, claudication:  Over the next 60 days, patient will:  . Work with the care management team to address educational, disease management, and care coordination needs  . Begin or continue self health monitoring activities as directed today Measure and record blood pressure 3 times per week . Call provider office for new or worsened signs and symptoms Blood pressure findings outside established parameters . Call care management team with questions or concerns . Verbalize basic understanding of patient centered plan of care established today  Interventions related to HTN, HLD, Afib, claudication:  . Evaluation of current treatment plans and patient's adherence to plan as established by provider . Assessed patient understanding of disease states . Assessed patient's education and care coordination needs . Provided disease specific education to patient  . Collaborated with appropriate clinical care team members regarding patient needs . Chart reviewed including recent office notes, lab results, and imaging reports o Discussed LE venous doppler results . Reviewed and discussed upcoming appointments:  o LE arterial Doppler on 04/07/20 . Discussed mobility and ability to perform ADLs o Uses cane for ambulation o BLE pain when standing or walking o Drives locally . Discussed family/social support o Has help form niece and nephew with transportation outside of the local area and they help with IADLs as needed o Lives alone. Wife is in a SNF . Provided with RN Care Manager contact number and encouraged to reach out as  needed . Follow-up appt with RN Care Manager scheduled for 05/11/20  Patient Self Care Activities related to HTN, HLD, Afib, claudication:  . Patient is unable to independently self-manage chronic health conditions  Initial goal documentation        Frank Cook was given information about Chronic Care Management services including:  1. CCM service includes personalized support from designated clinical staff supervised by his physician, including individualized plan of care and coordination with other care providers 2. 24/7 contact phone numbers for assistance for urgent and routine care needs. 3. Service will only be billed when office clinical staff spend 20 minutes or more in a month to coordinate care. 4. Only one practitioner may furnish and bill the service in a calendar month. 5. The patient may stop CCM services at any time (effective at the end of the month) by phone call to the office staff. 6. The patient will be responsible for cost sharing (co-pay) of up to 20% of the service fee (after annual deductible is met).  Patient agreed to services and verbal consent obtained.   Patient verbalizes understanding of instructions provided today.   Telephone follow up appointment with care management team member scheduled for: 05/11/20 with RN Care Manager  Chong Sicilian, BSN, RN-BC [place] [facility] [place] Management Direct Dial: [phone]

## 2020-04-05 ENCOUNTER — Other Ambulatory Visit: Payer: Self-pay | Admitting: Cardiology

## 2020-04-05 DIAGNOSIS — I739 Peripheral vascular disease, unspecified: Secondary | ICD-10-CM

## 2020-04-07 ENCOUNTER — Ambulatory Visit (HOSPITAL_COMMUNITY)
Admission: RE | Admit: 2020-04-07 | Discharge: 2020-04-07 | Disposition: A | Discharge status: Home / Self Care | Payer: Medicare Other | Source: Ambulatory Visit | Attending: Cardiology | Admitting: Cardiology

## 2020-04-07 ENCOUNTER — Other Ambulatory Visit: Payer: Self-pay

## 2020-04-07 DIAGNOSIS — I739 Peripheral vascular disease, unspecified: Secondary | ICD-10-CM | POA: Diagnosis not present

## 2020-04-12 DIAGNOSIS — H2513 Age-related nuclear cataract, bilateral: Secondary | ICD-10-CM | POA: Diagnosis not present

## 2020-04-12 DIAGNOSIS — H40053 Ocular hypertension, bilateral: Secondary | ICD-10-CM | POA: Diagnosis not present

## 2020-05-11 ENCOUNTER — Telehealth: Payer: Medicare Other | Admitting: *Deleted

## 2020-05-13 DIAGNOSIS — H401131 Primary open-angle glaucoma, bilateral, mild stage: Secondary | ICD-10-CM | POA: Diagnosis not present

## 2020-05-13 DIAGNOSIS — H2513 Age-related nuclear cataract, bilateral: Secondary | ICD-10-CM | POA: Diagnosis not present

## 2020-05-13 DIAGNOSIS — H2511 Age-related nuclear cataract, right eye: Secondary | ICD-10-CM | POA: Diagnosis not present

## 2020-05-13 DIAGNOSIS — H2512 Age-related nuclear cataract, left eye: Secondary | ICD-10-CM | POA: Diagnosis not present

## 2020-05-13 DIAGNOSIS — H401111 Primary open-angle glaucoma, right eye, mild stage: Secondary | ICD-10-CM | POA: Diagnosis not present

## 2020-05-13 DIAGNOSIS — Z01818 Encounter for other preprocedural examination: Secondary | ICD-10-CM | POA: Diagnosis not present

## 2020-05-13 DIAGNOSIS — H409 Unspecified glaucoma: Secondary | ICD-10-CM | POA: Diagnosis not present

## 2020-06-03 DIAGNOSIS — H401131 Primary open-angle glaucoma, bilateral, mild stage: Secondary | ICD-10-CM | POA: Diagnosis not present

## 2020-06-03 DIAGNOSIS — H2512 Age-related nuclear cataract, left eye: Secondary | ICD-10-CM | POA: Diagnosis not present

## 2020-06-03 DIAGNOSIS — H401121 Primary open-angle glaucoma, left eye, mild stage: Secondary | ICD-10-CM | POA: Diagnosis not present

## 2020-06-03 DIAGNOSIS — H409 Unspecified glaucoma: Secondary | ICD-10-CM | POA: Diagnosis not present

## 2020-06-07 ENCOUNTER — Ambulatory Visit: Payer: Self-pay | Admitting: Nurse Practitioner

## 2020-06-17 ENCOUNTER — Encounter: Payer: Self-pay | Admitting: Nurse Practitioner

## 2020-06-17 ENCOUNTER — Other Ambulatory Visit: Payer: Self-pay

## 2020-06-17 ENCOUNTER — Ambulatory Visit (INDEPENDENT_AMBULATORY_CARE_PROVIDER_SITE_OTHER): Payer: Medicare Other | Admitting: Nurse Practitioner

## 2020-06-17 VITALS — BP 138/82 | HR 80 | Temp 97.1°F | Ht 70.0 in | Wt 187.8 lb

## 2020-06-17 DIAGNOSIS — I70213 Atherosclerosis of native arteries of extremities with intermittent claudication, bilateral legs: Secondary | ICD-10-CM | POA: Diagnosis not present

## 2020-06-17 DIAGNOSIS — K219 Gastro-esophageal reflux disease without esophagitis: Secondary | ICD-10-CM

## 2020-06-17 DIAGNOSIS — Z6827 Body mass index (BMI) 27.0-27.9, adult: Secondary | ICD-10-CM

## 2020-06-17 DIAGNOSIS — I10 Essential (primary) hypertension: Secondary | ICD-10-CM

## 2020-06-17 DIAGNOSIS — E782 Mixed hyperlipidemia: Secondary | ICD-10-CM

## 2020-06-17 DIAGNOSIS — I4811 Longstanding persistent atrial fibrillation: Secondary | ICD-10-CM

## 2020-06-17 DIAGNOSIS — I739 Peripheral vascular disease, unspecified: Secondary | ICD-10-CM | POA: Diagnosis not present

## 2020-06-17 DIAGNOSIS — E559 Vitamin D deficiency, unspecified: Secondary | ICD-10-CM

## 2020-06-17 MED ORDER — SIMVASTATIN 20 MG PO TABS: 20.0000 mg | ORAL_TABLET | Freq: Every day | ORAL | 1 refills | Status: DC

## 2020-06-17 MED ORDER — HYDROCHLOROTHIAZIDE 25 MG PO TABS: 25.0000 mg | ORAL_TABLET | Freq: Every day | ORAL | 1 refills | Status: DC

## 2020-06-17 MED ORDER — OMEPRAZOLE 20 MG PO CPDR: 20.0000 mg | DELAYED_RELEASE_CAPSULE | Freq: Every day | ORAL | 1 refills | Status: DC

## 2020-06-17 MED ORDER — QUINAPRIL HCL 40 MG PO TABS: ORAL_TABLET | ORAL | 1 refills | Status: DC

## 2020-06-17 MED ORDER — CILOSTAZOL 100 MG PO TABS: ORAL_TABLET | ORAL | 1 refills | Status: DC

## 2020-06-17 MED ORDER — APIXABAN 2.5 MG PO TABS: ORAL_TABLET | ORAL | 1 refills | Status: DC

## 2020-06-17 MED ORDER — METOPROLOL SUCCINATE ER 100 MG PO TB24: 100.0000 mg | ORAL_TABLET | Freq: Every day | ORAL | 1 refills | Status: DC

## 2020-06-17 NOTE — Patient Instructions (Signed)
Fall Prevention in the Home, Adult Falls can cause injuries and can happen to people of all ages. There are many things you can do to make your home safe and to help prevent falls. Ask for help when making these changes. What actions can I take to prevent falls? General Instructions  Use good lighting in all rooms. Replace any light bulbs that burn out.  Turn on the lights in dark areas. Use night-lights.  Keep items that you use often in easy-to-reach places. Lower the shelves around your home if needed.  Set up your furniture so you have a clear path. Avoid moving your furniture around.  Do not have throw rugs or other things on the floor that can make you trip.  Avoid walking on wet floors.  If any of your floors are uneven, fix them.  Add color or contrast paint or tape to clearly mark and help you see: ? Grab bars or handrails. ? First and last steps of staircases. ? Where the edge of each step is.  If you use a stepladder: ? Make sure that it is fully opened. Do not climb a closed stepladder. ? Make sure the sides of the stepladder are locked in place. ? Ask someone to hold the stepladder while you use it.  Know where your pets are when moving through your home. What can I do in the bathroom?  Keep the floor dry. Clean up any water on the floor right away.  Remove soap buildup in the tub or shower.  Use nonskid mats or decals on the floor of the tub or shower.  Attach bath mats securely with double-sided, nonslip rug tape.  If you need to sit down in the shower, use a plastic, nonslip stool.  Install grab bars by the toilet and in the tub and shower. Do not use towel bars as grab bars.      What can I do in the bedroom?  Make sure that you have a light by your bed that is easy to reach.  Do not use any sheets or blankets for your bed that hang to the floor.  Have a firm chair with side arms that you can use for support when you get dressed. What can I do in  the kitchen?  Clean up any spills right away.  If you need to reach something above you, use a step stool with a grab bar.  Keep electrical cords out of the way.  Do not use floor polish or wax that makes floors slippery. What can I do with my stairs?  Do not leave any items on the stairs.  Make sure that you have a light switch at the top and the bottom of the stairs.  Make sure that there are handrails on both sides of the stairs. Fix handrails that are broken or loose.  Install nonslip stair treads on all your stairs.  Avoid having throw rugs at the top or bottom of the stairs.  Choose a carpet that does not hide the edge of the steps on the stairs.  Check carpeting to make sure that it is firmly attached to the stairs. Fix carpet that is loose or worn. What can I do on the outside of my home?  Use bright outdoor lighting.  Fix the edges of walkways and driveways and fix any cracks.  Remove anything that might make you trip as you walk through a door, such as a raised step or threshold.  Trim any   bushes or trees on paths to your home.  Check to see if handrails are loose or broken and that both sides of all steps have handrails.  Install guardrails along the edges of any raised decks and porches.  Clear paths of anything that can make you trip, such as tools or rocks.  Have leaves, snow, or ice cleared regularly.  Use sand or salt on paths during winter.  Clean up any spills in your garage right away. This includes grease or oil spills. What other actions can I take?  Wear shoes that: ? Have a low heel. Do not wear high heels. ? Have rubber bottoms. ? Feel good on your feet and fit well. ? Are closed at the toe. Do not wear open-toe sandals.  Use tools that help you move around if needed. These include: ? Canes. ? Walkers. ? Scooters. ? Crutches.  Review your medicines with your doctor. Some medicines can make you feel dizzy. This can increase your chance  of falling. Ask your doctor what else you can do to help prevent falls. Where to find more information  Centers for Disease Control and Prevention, STEAD[employer]: www.cdc.gov  National Institute on Aging: www.nia.nih.gov Contact a doctor if:  You are afraid of falling at home.  You feel weak, drowsy, or dizzy at home.  You fall at home. Summary  There are many simple things that you can do to make your home safe and to help prevent falls.  Ways to make your home safe include removing things that can make you trip and installing grab bars in the bathroom.  Ask for help when making these changes in your home. This information is not intended to replace advice given to you by your health care provider. Make sure you discuss any questions you have with your health care provider. Document Revised: 12/24/2019 Document Reviewed: 12/24/2019 Elsevier Patient Education  2021 Elsevier Inc.  

## 2020-06-17 NOTE — Progress Notes (Signed)
Subjective:    Patient ID: Frank Cook, male    DOB: 12/27/1931, 87 y.o.   MRN: [id number]   Chief Complaint: medical management of chronic issues     HPI:  1. HYPERTENSION, BENIGN No c/o chest  Or headache. Has occasional SOB but no worse then normal for him. He does not check his blood pressure at home. BP Readings from Last 3 Encounters:  03/05/20 133/79  03/03/20 124/90  10/30/19 139/87     2. Mixed hyperlipidemia He does watch diet. Is not able to do much exercise. Lab Results  Component Value Date   CHOL 133 03/05/2020   HDL 55 03/05/2020   LDLCALC 58 03/05/2020   TRIG 112 03/05/2020   CHOLHDL 2.4 03/05/2020     3. Longstanding persistent atrial fibrillation (Frank Cook) Denies feeling palpitations or heart racing. He takes eliquis daily and denies any bleeding problems.  4. PVD (peripheral vascular disease) (Frank Cook) Hs occasional swelling in lower ext. Toes feel cold a lot. Saw cardiology and they checked his extremities. Test results showed normal blood flow.  5. Gastroesophageal reflux disease without esophagitis Is on omeprazole daily and is dong well. Has no reflux symptoms.  6. Vitamin D deficiency Takes a daily vitamin d suppelment  7. BMI 27.0-27.9,adult Weight is down 5lbs  Wt Readings from Last 3 Encounters:  06/17/20 187 lb 12.8 oz (85.2 kg)  03/05/20 192 lb 12.8 oz (87.5 kg)  03/03/20 194 lb (88 kg)   BMI Readings from Last 3 Encounters:  06/17/20 26.95 kg/m  03/05/20 27.66 kg/m  03/03/20 27.84 kg/m     Outpatient Encounter Medications as of 06/17/2020  Medication Sig  . apixaban (ELIQUIS) 2.5 MG TABS tablet Take 1 tablet (2.5 mg total) by mouth 2 (two) times daily.  . Cholecalciferol (VITAMIN D3) 5000 units CAPS Take 5,000 Units by mouth daily.  . cilostazol (PLETAL) 100 MG tablet TAKE  (1)  TABLET TWICE A DAY.  Marland Kitchen Glucosamine 500 MG CAPS Take 1,500 mg by mouth daily.  . hydrochlorothiazide (HYDRODIURIL) 25 MG tablet Take 1 tablet (25 mg  total) by mouth daily.  . metoprolol succinate (TOPROL-XL) 100 MG 24 hr tablet Take 1 tablet (100 mg total) by mouth daily. Take with or immediately following a meal.  . Omega 3 1000 MG CAPS Take 2,000 mg by mouth daily.  Marland Kitchen omeprazole (PRILOSEC) 20 MG capsule Take 1 capsule (20 mg total) by mouth daily.  . quinapril (ACCUPRIL) 40 MG tablet TAKE 1 & 1/2 TABLETS ONCE DAILY  . simvastatin (ZOCOR) 20 MG tablet Take 1 tablet (20 mg total) by mouth daily.     Past Surgical History:  Procedure Laterality Date  . COLONOSCOPY    . None      Family History  Problem Relation Age of Onset  . COPD Mother   . Cancer Father        Bone  . Cancer Brother        Bone  . Stroke Maternal Grandfather   . Arthritis Brother   . Hypertension Brother     New complaints: None today  Social history: Lives by hisself. His wife is still in a nursing facility Frank Cook )  Controlled substance contract: n/a    Review of Systems  Constitutional: Negative for diaphoresis.  Eyes: Negative for pain.  Respiratory: Negative for shortness of breath.   Cardiovascular: Negative for chest pain, palpitations and leg swelling.  Gastrointestinal: Negative for abdominal pain.  Endocrine: Negative for polydipsia.  Musculoskeletal: Positive for gait problem (wlaks with cane).  Skin: Negative for rash.  Neurological: Negative for dizziness, weakness and headaches.  Hematological: Does not bruise/bleed easily.  All other systems reviewed and are negative.      Objective:   Physical Exam Vitals and nursing note reviewed.  Constitutional:      Appearance: Normal appearance. He is well-developed and well-nourished.  HENT:     Head: Normocephalic.     Nose: Nose normal.     Mouth/Throat:     Mouth: Oropharynx is clear and moist.  Eyes:     Extraocular Movements: EOM normal.     Pupils: Pupils are equal, round, and reactive to light.  Neck:     Thyroid: No thyroid mass or thyromegaly.      Vascular: No carotid bruit or JVD.     Trachea: Phonation normal.  Cardiovascular:     Rate and Rhythm: Normal rate and regular rhythm.  Pulmonary:     Effort: Pulmonary effort is normal. No respiratory distress.     Breath sounds: Normal breath sounds.  Abdominal:     General: Bowel sounds are normal. Aorta is normal.     Palpations: Abdomen is soft.     Tenderness: There is no abdominal tenderness.  Musculoskeletal:        General: Normal range of motion.     Cervical back: Normal range of motion and neck supple.     Comments: Gait slow and steady Using cane for balance  Lymphadenopathy:     Cervical: No cervical adenopathy.  Skin:    General: Skin is warm and dry.  Neurological:     Mental Status: He is alert and oriented to person, place, and time.  Psychiatric:        Mood and Affect: Mood and affect normal.        Behavior: Behavior normal.        Thought Content: Thought content normal.        Judgment: Judgment normal.    BP 138/82   Pulse 80   Temp (!) 97.1 F (36.2 C)   Ht 5' 10"  (1.778 m)   Wt 187 lb 12.8 oz (85.2 kg)   SpO2 98%   BMI 26.95 kg/m          Assessment & Plan:  Frank Cook comes in today with chief complaint of Medical Management of Chronic Issues   Diagnosis and orders addressed:  1. HYPERTENSION, BENIGN Low sodium diet - CBC with Differential/Platelet - CMP14+EGFR - hydrochlorothiazide (HYDRODIURIL) 25 MG tablet; Take 1 tablet (25 mg total) by mouth daily.  Dispense: 90 tablet; Refill: 1 - metoprolol succinate (TOPROL-XL) 100 MG 24 hr tablet; Take 1 tablet (100 mg total) by mouth daily. Take with or immediately following a meal.  Dispense: 90 tablet; Refill: 1 - quinapril (ACCUPRIL) 40 MG tablet; TAKE 1 & 1/2 TABLETS ONCE DAILY  Dispense: 135 tablet; Refill: 1  2. Mixed hyperlipidemia Low fat diet - Lipid panel - simvastatin (ZOCOR) 20 MG tablet; Take 1 tablet (20 mg total) by mouth daily.  Dispense: 90 tablet; Refill: 1  3.  Longstanding persistent atrial fibrillation (HCC) Avoid lots of caffeine - apixaban (ELIQUIS) 2.5 MG TABS tablet; Take 1 tablet (2.5 mg total) by mouth 2 (two) times daily.  Dispense: 180 tablet; Refill: 1  4. PVD (peripheral vascular disease) ([name]) Keep follow up with cardiology  5. Gastroesophageal reflux disease without esophagitis Avoid spicy foods Do not eat 2 hours prior  to bedtime - omeprazole (PRILOSEC) 20 MG capsule; Take 1 capsule (20 mg total) by mouth daily.  Dispense: 90 capsule; Refill: 1  6. Vitamin D deficiency Continue daily vitamin d supplement  7. BMI 27.0-27.9,adult Discussed diet and exercise for person with BMI >25 Will recheck weight in 3-6 months  8. Intermittent claudication of both lower extremities due to atherosclerosis (HCC) - cilostazol (PLETAL) 100 MG tablet; TAKE  (1)  TABLET TWICE A DAY.  Dispense: 180 tablet; Refill: 1   Labs pending Health Maintenance reviewed Diet and exercise encouraged  Follow up plan: 3 months   Mary-Margaret Hassell Done, FNP

## 2020-06-18 LAB — CMP14+EGFR
ALT: 14 IU/L (ref 0–44)
AST: 25 IU/L (ref 0–40)
Albumin/Globulin Ratio: 1.4 (ref 1.2–2.2)
Albumin: 4.3 g/dL (ref 3.6–4.6)
Alkaline Phosphatase: 42 IU/L — ABNORMAL LOW (ref 44–121)
BUN/Creatinine Ratio: 15 (ref 10–24)
BUN: 20 mg/dL (ref 8–27)
Bilirubin Total: 0.4 mg/dL (ref 0.0–1.2)
CO2: 22 mmol/L (ref 20–29)
Calcium: 9.6 mg/dL (ref 8.6–10.2)
Chloride: 103 mmol/L (ref 96–106)
Creatinine, Ser: 1.35 mg/dL — ABNORMAL HIGH (ref 0.76–1.27)
GFR calc Af Amer: 55 mL/min/{1.73_m2} — ABNORMAL LOW (ref >59)
GFR calc non Af Amer: 47 mL/min/{1.73_m2} — ABNORMAL LOW (ref >59)
Globulin, Total: 3.1 g/dL (ref 1.5–4.5)
Glucose: 97 mg/dL (ref 65–99)
Potassium: 4.1 mmol/L (ref 3.5–5.2)
Sodium: 141 mmol/L (ref 134–144)
Total Protein: 7.4 g/dL (ref 6.0–8.5)

## 2020-06-18 LAB — CBC WITH DIFFERENTIAL/PLATELET
Basophils Absolute: 0 10*3/uL (ref 0.0–0.2)
Basos: 0 %
EOS (ABSOLUTE): 0.2 10*3/uL (ref 0.0–0.4)
Eos: 3 %
Hematocrit: 43.6 % (ref 37.5–51.0)
Hemoglobin: 15 g/dL (ref 13.0–17.7)
Immature Grans (Abs): 0 10*3/uL (ref 0.0–0.1)
Immature Granulocytes: 1 %
Lymphocytes Absolute: 2 10*3/uL (ref 0.7–3.1)
Lymphs: 30 %
MCH: 31 pg (ref 26.6–33.0)
MCHC: 34.4 g/dL (ref 31.5–35.7)
MCV: 90 fL (ref 79–97)
Monocytes Absolute: 0.5 10*3/uL (ref 0.1–0.9)
Monocytes: 7 %
Neutrophils Absolute: 4 10*3/uL (ref 1.4–7.0)
Neutrophils: 59 %
Platelets: 222 10*3/uL (ref 150–450)
RBC: 4.84 x10E6/uL (ref 4.14–5.80)
RDW: 13.5 % (ref 11.6–15.4)
WBC: 6.8 10*3/uL (ref 3.4–10.8)

## 2020-06-18 LAB — LIPID PANEL
Chol/HDL Ratio: 2.5 ratio (ref 0.0–5.0)
Cholesterol, Total: 127 mg/dL (ref 100–199)
HDL: 51 mg/dL (ref >39)
LDL Chol Calc (NIH): 56 mg/dL (ref 0–99)
Triglycerides: 113 mg/dL (ref 0–149)
VLDL Cholesterol Cal: 20 mg/dL (ref 5–40)

## 2020-07-12 DIAGNOSIS — Z961 Presence of intraocular lens: Secondary | ICD-10-CM | POA: Diagnosis not present

## 2020-07-12 DIAGNOSIS — H401131 Primary open-angle glaucoma, bilateral, mild stage: Secondary | ICD-10-CM | POA: Diagnosis not present

## 2020-07-16 ENCOUNTER — Encounter: Payer: Self-pay | Admitting: *Deleted

## 2020-08-11 ENCOUNTER — Ambulatory Visit (INDEPENDENT_AMBULATORY_CARE_PROVIDER_SITE_OTHER): Payer: Medicare Other

## 2020-08-11 DIAGNOSIS — Z0000 Encounter for general adult medical examination without abnormal findings: Secondary | ICD-10-CM

## 2020-08-11 NOTE — Progress Notes (Signed)
MEDICARE ANNUAL WELLNESS VISIT  08/11/2020  Telephone Visit Disclaimer This Medicare AWV was conducted by telephone due to national recommendations for restrictions regarding the COVID-19 Pandemic (e.g. social distancing).  I verified, using two identifiers, that I am speaking with Frank Cook or their authorized healthcare agent. I discussed the limitations, risks, security, and privacy concerns of performing an evaluation and management service by telephone and the potential availability of an in-person appointment in the future. The patient expressed understanding and agreed to proceed.  Location of Patient: Home Location of Provider (nurse):  [facility]  Subjective:    Frank Cook is a 87 y.o. male patient of Bennie PieriniMartin, Mary-Margaret, FNP who had a Medicare Annual Wellness Visit today via telephone. Frank RungJoe is Retired and lives alone, his wife is in a nursing home.He has no children. He reports that he is socially active and does interact with friends/family regularly. He is minimally physically active and enjoys spending time with family.  Patient Care Team: Bennie PieriniMartin, Mary-Margaret, FNP as PCP - General (Nurse Practitioner) Rollene RotundaHochrein, James, MD as Consulting Physician (Cardiology) Gwenith DailyHudy, Kristen N, RN as Case Manager  Advanced Directives 08/11/2020 08/04/2019 07/31/2018 07/26/2017 07/19/2016  Does Patient Have a Medical Advance Directive? Yes Yes Yes Yes Yes  Type of Advance Directive Living will;Healthcare Power of [address] Corporationttorney Healthcare Power of [place] RiverAttorney;Living will Living will Healthcare Power of [place];Living will Healthcare Power of [place];Living will  Does patient want to make changes to medical advance directive? No - Patient declined - No - Patient declined - No - Patient declined  Copy of Healthcare Power of Attorney in Chart? No - copy requested Yes - validated most recent copy scanned in chart (See row information) - Yes Yes    Hospital Utilization Over the Past 12 Months: # of  hospitalizations or ER visits: 0 # of surgeries: 1  Review of Systems    Patient reports that his overall health is unchanged compared to last year.  History obtained from chart review and the patient  Patient Reported Readings (BP, Pulse, CBG, Weight, etc) none  Pain Assessment Pain : No/denies pain     Current Medications & Allergies (verified) Allergies as of 08/11/2020   No Known Allergies     Medication List       Accurate as of August 11, 2020 10:42 AM. If you have any questions, ask your nurse or doctor.        apixaban 2.5 MG Tabs tablet Commonly known as: Eliquis Take 1 tablet (2.5 mg total) by mouth 2 (two) times daily.   cilostazol 100 MG tablet Commonly known as: PLETAL TAKE  (1)  TABLET TWICE A DAY.   Glucosamine 500 MG Caps Take 1,500 mg by mouth daily.   hydrochlorothiazide 25 MG tablet Commonly known as: HYDRODIURIL Take 1 tablet (25 mg total) by mouth daily.   latanoprost 0.005 % ophthalmic solution Commonly known as: XALATAN Place 1 drop into both eyes at bedtime.   metoprolol succinate 100 MG 24 hr tablet Commonly known as: TOPROL-XL Take 1 tablet (100 mg total) by mouth daily. Take with or immediately following a meal.   Omega 3 1000 MG Caps Take 2,000 mg by mouth daily.   omeprazole 20 MG capsule Commonly known as: PRILOSEC Take 1 capsule (20 mg total) by mouth daily.   quinapril 40 MG tablet Commonly known as: ACCUPRIL TAKE 1 & 1/2 TABLETS ONCE DAILY   simvastatin 20 MG tablet Commonly known as: ZOCOR Take 1 tablet (20 mg  total) by mouth daily.   Vitamin D3 125 MCG (5000 UT) Caps Take 5,000 Units by mouth daily.       History (reviewed): Past Medical History:  Diagnosis Date  . Atrial fibrillation (HCC)   . Colon polyps   . GERD (gastroesophageal reflux disease)   . Hyperlipidemia   . Hypertension   . Osteopenia    Past Surgical History:  Procedure Laterality Date  . CATARACT EXTRACTION Bilateral   . COLONOSCOPY     . None     Family History  Problem Relation Age of Onset  . COPD Mother   . Cancer Father        Bone  . Cancer Brother        Bone  . Stroke Maternal Grandfather   . Arthritis Brother   . Hypertension Brother    Social History   Socioeconomic History  . Marital status: Married    Spouse name: Not on file  . Number of children: 0  . Years of education: Not on file  . Highest education level: High school graduate  Occupational History  . Occupation: Retired    Associate Professor: [employer]  Tobacco Use  . Smoking status: Never Smoker  . Smokeless tobacco: Never Used  . Tobacco comment: Never smoker   Vaping Use  . Vaping Use: Never used  Substance and Sexual Activity  . Alcohol use: No  . Drug use: No  . Sexual activity: Not on file  Other Topics Concern  . Not on file  Social History Narrative   Lives alone.  Wife is now in nursing home.   Social Determinants of Health   Financial Resource Strain: Low Risk   . Difficulty of Paying Living Expenses: Not hard at all  Food Insecurity: No Food Insecurity  . Worried About Programme researcher, broadcasting/film/video in the Last Year: Never true  . Ran Out of Food in the Last Year: Never true  Transportation Needs: No Transportation Needs  . Lack of Transportation (Medical): No  . Lack of Transportation (Non-Medical): No  Physical Activity: Inactive  . Days of Exercise per Week: 0 days  . Minutes of Exercise per Session: 0 min  Stress: Not on file  Social Connections: Moderately Isolated  . Frequency of Communication with Friends and Family: More than three times a week  . Frequency of Social Gatherings with Friends and Family: More than three times a week  . Attends Religious Services: Never  . Active Member of Clubs or Organizations: No  . Attends Banker Meetings: Never  . Marital Status: Married    Activities of Daily Living In your present state of health, do you have any difficulty performing the following activities: 08/11/2020  04/01/2020  Hearing? N N  Vision? N N  Difficulty concentrating or making decisions? N N  Walking or climbing stairs? Y Y  Comment - due to claudication  Dressing or bathing? N N  Doing errands, shopping? N N  Comment - drives locally but has family that will drive to appts outside of the local area  Preparing Food and eating ? N N  Using the Toilet? N N  In the past six months, have you accidently leaked urine? N N  Do you have problems with loss of bowel control? N N  Managing your Medications? N N  Comment - mediations are delivered by pharmacy  Managing your Finances? N N  Housekeeping or managing your Housekeeping? N N  Some recent data might  be hidden   Patient reports that he does have some difficulty in walking and uses either a cane or walker.  Patient Education/ Literacy How often do you need to have someone help you when you read instructions, pamphlets, or other written materials from your doctor or pharmacy?: 1 - Never What is the last grade level you completed in school?: 12th grade  Exercise Current Exercise Habits: The patient does not participate in regular exercise at present, Exercise limited by: None identified  Diet Patient reports consuming 3 meals a day and 1 snack(s) a day Patient reports that his primary diet is: Regular Patient reports that he does have regular access to food.   Depression Screen PHQ 2/9 Scores 08/11/2020 06/17/2020 03/05/2020 10/30/2019 08/04/2019 07/30/2019 04/29/2019  PHQ - 2 Score 0 0 0 0 0 0 0     Fall Risk Fall Risk  08/11/2020 06/17/2020 04/01/2020 03/05/2020 10/30/2019  Falls in the past year? 0 0 0 0 0  Number falls in past yr: - - - 0 -  Injury with Fall? - - - 0 -  Comment - - - - -  Risk for fall due to : - - History of fall(s);Impaired balance/gait;Other (Comment) Impaired balance/gait -  Risk for fall due to: Comment - - claudication - -  Follow up Falls evaluation completed - Falls evaluation completed;Falls prevention  discussed Falls evaluation completed -     Objective:  Frank Cook seemed alert and oriented and he participated appropriately during our telephone visit.  Blood Pressure Weight BMI  BP Readings from Last 3 Encounters:  06/17/20 138/82  03/05/20 133/79  03/03/20 124/90   Wt Readings from Last 3 Encounters:  06/17/20 187 lb 12.8 oz (85.2 kg)  03/05/20 192 lb 12.8 oz (87.5 kg)  03/03/20 194 lb (88 kg)   BMI Readings from Last 1 Encounters:  06/17/20 26.95 kg/m    *Unable to obtain current vital signs, weight, and BMI due to telephone visit type  Hearing/Vision  . [place] did not seem to have difficulty with hearing/understanding during the telephone conversation . Reports that he has had a formal eye exam by an eye care professional within the past year . Reports that he has not had a formal hearing evaluation within the past year *Unable to fully assess hearing and vision during telephone visit type  Cognitive Function: 6CIT Screen 08/11/2020 08/04/2019  What Year? 0 points 0 points  What month? 0 points 0 points  What time? 0 points 0 points  Count back from 20 0 points 0 points  Months in reverse 0 points 0 points  Repeat phrase 6 points 2 points  Total Score 6 2   (Normal:0-7, Significant for Dysfunction: >8)  Normal Cognitive Function Screening: Yes   Immunization & Health Maintenance Record Immunization History  Administered Date(s) Administered  . Fluad Quad(high Dose 65+) 03/05/2020  . Influenza, High Dose Seasonal PF 04/03/2016, 04/02/2017  . Influenza,inj,Quad PF,6+ Mos 02/24/2013, 03/16/2014, 03/29/2015, 03/18/2019  . Influenza,inj,quad, With Preservative 03/18/2019  . Influenza-Unspecified 03/13/2018  . Moderna SARS-COV2 Booster Vaccination 03/31/2020  . Moderna Sars-Covid-2 Vaccination 07/01/2019, 07/29/2019  . Pneumococcal Conjugate-13 09/17/2014  . Pneumococcal Polysaccharide-23 06/06/1999  . Tdap 03/06/2011  . Zoster 04/06/2007  . Zoster Recombinat  (Shingrix) 08/12/2018, 11/12/2018    Health Maintenance  Topic Date Due  . TETANUS/TDAP  03/14/2021  . INFLUENZA VACCINE  Completed  . COVID-19 Vaccine  Completed  . PNA vac Low Risk Adult  Completed  . HPV VACCINES  Aged Out       Assessment  This is a routine wellness examination for Frank Cook.  Health Maintenance: Due or Overdue There are no preventive care reminders to display for this patient.  Frank Cook does not need a referral for [employer] Assistance: Care Management:   no Social Work:    no Prescription Assistance:  no Nutrition/Diabetes Education:  no   Cook:  Personalized Goals Goals Addressed            This Visit's Progress   . Patient Stated       08/11/2020 AWV Goal: Fall Prevention  . Over the next year, patient will decrease their risk for falls by: o Using assistive devices, such as a cane or walker, as needed o Identifying fall risks within their home and correcting them by: - Removing throw rugs - Adding handrails to stairs or ramps - Removing clutter and keeping a clear pathway throughout the home - Increasing light, especially at night - Adding shower handles/bars - Raising toilet seat o Identifying potential personal risk factors for falls: - Medication side effects - Incontinence/urgency - Vestibular dysfunction - Hearing loss - Musculoskeletal disorders - Neurological disorders - Orthostatic hypotension        Personalized Health Maintenance & Screening Recommendations  up to date  Lung Cancer Screening Recommended: no (Low Dose CT Chest recommended if Age 64-80 years, 30 pack-year currently smoking OR have quit w/in past 15 years) Hepatitis C Screening recommended: no HIV Screening recommended: no  Advanced Directives: Written information was not prepared per patient's request.  Referrals & Orders No orders of the defined types were placed in this encounter.   Follow-up Cook . Follow-up with Bennie Pierini, FNP as planned     I have personally reviewed and noted the following in the patient's chart:   . Medical and social history . Use of alcohol, tobacco or illicit drugs  . Current medications and supplements . Functional ability and status . Nutritional status . Physical activity . Advanced directives . List of other physicians . Hospitalizations, surgeries, and ER visits in previous 12 months . Vitals . Screenings to include cognitive, depression, and falls . Referrals and appointments  In addition, I have reviewed and discussed with Frank Cook certain preventive protocols, quality metrics, and best practice recommendations. A written personalized care Cook for preventive services as well as general preventive health recommendations is available and can be mailed to the patient at his request.      Mariam Dollar, LPN    11/10/6718   Patient declined after visit summary

## 2020-08-23 ENCOUNTER — Other Ambulatory Visit: Payer: Self-pay | Admitting: Nurse Practitioner

## 2020-08-23 DIAGNOSIS — I70213 Atherosclerosis of native arteries of extremities with intermittent claudication, bilateral legs: Secondary | ICD-10-CM

## 2020-08-23 DIAGNOSIS — I10 Essential (primary) hypertension: Secondary | ICD-10-CM

## 2020-09-09 ENCOUNTER — Other Ambulatory Visit: Payer: Self-pay | Admitting: Nurse Practitioner

## 2020-09-09 DIAGNOSIS — K219 Gastro-esophageal reflux disease without esophagitis: Secondary | ICD-10-CM

## 2020-09-13 ENCOUNTER — Encounter: Payer: Self-pay | Admitting: Nurse Practitioner

## 2020-09-13 ENCOUNTER — Ambulatory Visit (INDEPENDENT_AMBULATORY_CARE_PROVIDER_SITE_OTHER): Payer: Medicare Other | Admitting: Nurse Practitioner

## 2020-09-13 ENCOUNTER — Other Ambulatory Visit: Payer: Self-pay

## 2020-09-13 VITALS — BP 158/94 | HR 80 | Temp 97.3°F | Resp 20 | Ht 70.0 in | Wt 188.0 lb

## 2020-09-13 DIAGNOSIS — I739 Peripheral vascular disease, unspecified: Secondary | ICD-10-CM | POA: Diagnosis not present

## 2020-09-13 DIAGNOSIS — E559 Vitamin D deficiency, unspecified: Secondary | ICD-10-CM

## 2020-09-13 DIAGNOSIS — I4811 Longstanding persistent atrial fibrillation: Secondary | ICD-10-CM | POA: Diagnosis not present

## 2020-09-13 DIAGNOSIS — I10 Essential (primary) hypertension: Secondary | ICD-10-CM

## 2020-09-13 DIAGNOSIS — Z6827 Body mass index (BMI) 27.0-27.9, adult: Secondary | ICD-10-CM | POA: Diagnosis not present

## 2020-09-13 DIAGNOSIS — K219 Gastro-esophageal reflux disease without esophagitis: Secondary | ICD-10-CM | POA: Diagnosis not present

## 2020-09-13 DIAGNOSIS — I70213 Atherosclerosis of native arteries of extremities with intermittent claudication, bilateral legs: Secondary | ICD-10-CM | POA: Diagnosis not present

## 2020-09-13 DIAGNOSIS — E782 Mixed hyperlipidemia: Secondary | ICD-10-CM | POA: Diagnosis not present

## 2020-09-13 MED ORDER — CILOSTAZOL 100 MG PO TABS: 100.0000 mg | ORAL_TABLET | Freq: Two times a day (BID) | ORAL | 1 refills | Status: DC

## 2020-09-13 MED ORDER — OMEPRAZOLE 20 MG PO CPDR: DELAYED_RELEASE_CAPSULE | ORAL | 1 refills | Status: DC

## 2020-09-13 MED ORDER — HYDROCHLOROTHIAZIDE 25 MG PO TABS: 1.0000 | ORAL_TABLET | Freq: Every day | ORAL | 1 refills | Status: DC

## 2020-09-13 MED ORDER — METOPROLOL SUCCINATE ER 100 MG PO TB24: 100.0000 mg | ORAL_TABLET | Freq: Every day | ORAL | 1 refills | Status: DC

## 2020-09-13 MED ORDER — QUINAPRIL HCL 40 MG PO TABS: ORAL_TABLET | ORAL | 1 refills | Status: DC

## 2020-09-13 MED ORDER — SIMVASTATIN 20 MG PO TABS: 20.0000 mg | ORAL_TABLET | Freq: Every day | ORAL | 1 refills | Status: DC

## 2020-09-13 MED ORDER — APIXABAN 2.5 MG PO TABS: ORAL_TABLET | ORAL | 1 refills | Status: DC

## 2020-09-13 NOTE — Progress Notes (Signed)
Subjective:    Patient ID: Frank Cook, male    DOB: 1931/10/03, 87 y.o.   MRN: [id number]   Chief Complaint: Medical Management of Chronic Issues    HPI:  1. Primary hypertension Currently on toprol xl, HCTZ, and accupril. No c/o HA, SOB, vision changes. Does not monitor BP at home.   BP Readings from Last 3 Encounters:  09/13/20 (!) 158/94  06/17/20 138/82  03/05/20 133/79     2. Mixed hyperlipidemia Currently on simvastatin. Eats what he enjoys.   Lab Results  Component Value Date   CHOL 127 06/17/2020   HDL 51 06/17/2020   LDLCALC 56 06/17/2020   TRIG 113 06/17/2020   CHOLHDL 2.5 06/17/2020    3. Longstanding persistent atrial fibrillation ([place]) Follows with cardiology. On metoprolol and apixaban. Does not have complaints of dizziness, high heart rates or heart palps.   4. PVD (peripheral vascular disease) ([place]) On pletal. Follows with cardiology for management. Every day swelling and intermittent claudication. His feet and legs still ache through the day.   5. Gastroesophageal reflux disease without esophagitis On omeprazole. Working well for him. Avoids his triggers.   6. Vitamin D deficiency On a Vit D supplement. Gets outside on nice days.   7. BMI 27.0-27.9,adult Weight is stable. Still has a decent appetite. Some days he has a better appetite than others. Supplements with boost and ensure shakes.  Wt Readings from Last 3 Encounters:  09/13/20 188 lb (85.3 kg)  06/17/20 187 lb 12.8 oz (85.2 kg)  03/05/20 192 lb 12.8 oz (87.5 kg)       Outpatient Encounter Medications as of 09/13/2020  Medication Sig  . apixaban (ELIQUIS) 2.5 MG TABS tablet Take 1 tablet (2.5 mg total) by mouth 2 (two) times daily.  . Cholecalciferol (VITAMIN D3) 5000 units CAPS Take 5,000 Units by mouth daily.  . cilostazol (PLETAL) 100 MG tablet TAKE  (1)  TABLET TWICE A DAY.  Marland Kitchen Glucosamine 500 MG CAPS Take 1,500 mg by mouth daily.  . hydrochlorothiazide (HYDRODIURIL) 25 MG  tablet TAKE 1 TABLET BY MOUTH DAILY.  Marland Kitchen latanoprost (XALATAN) 0.005 % ophthalmic solution Place 1 drop into both eyes at bedtime.  . metoprolol succinate (TOPROL-XL) 100 MG 24 hr tablet TAKE 1 TABLET DAILY WITH FOOD  . Omega 3 1000 MG CAPS Take 2,000 mg by mouth daily.  Marland Kitchen omeprazole (PRILOSEC) 20 MG capsule TAKE (1) CAPSULE DAILY  . quinapril (ACCUPRIL) 40 MG tablet TAKE 1 & 1/2 TABLETS ONCE DAILY  . simvastatin (ZOCOR) 20 MG tablet Take 1 tablet (20 mg total) by mouth daily.   No facility-administered encounter medications on file as of 09/13/2020.    Past Surgical History:  Procedure Laterality Date  . CATARACT EXTRACTION Bilateral   . COLONOSCOPY    . None      Family History  Problem Relation Age of Onset  . COPD Mother   . Cancer Father        Bone  . Cancer Brother        Bone  . Stroke Maternal Grandfather   . Arthritis Brother   . Hypertension Brother     New complaints: None  Social history: Lives by himself  Controlled substance contract: N/A     Review of Systems  Constitutional: Negative for chills, fatigue and fever.  Respiratory: Negative for cough, shortness of breath and wheezing.   Cardiovascular: Positive for leg swelling. Negative for chest pain and palpitations.  Gastrointestinal: Negative for abdominal  pain, constipation and diarrhea.  Genitourinary: Negative for difficulty urinating, dysuria, frequency and urgency.  Neurological: Negative for dizziness, light-headedness and headaches.  Psychiatric/Behavioral: Negative for confusion. The patient is not nervous/anxious.        Objective:   Physical Exam Neck:     Vascular: No carotid bruit.  Cardiovascular:     Rate and Rhythm: Normal rate. Rhythm irregular.     Pulses:          Dorsalis pedis pulses are 1+ on the right side and 1+ on the left side.       Posterior tibial pulses are 1+ on the right side and 1+ on the left side.     Heart sounds: Normal heart sounds.  Pulmonary:      Effort: Pulmonary effort is normal.     Breath sounds: Normal breath sounds.  Abdominal:     General: Abdomen is flat. Bowel sounds are normal.     Palpations: Abdomen is soft.  Musculoskeletal:        General: Normal range of motion.     Cervical back: Normal range of motion and neck supple.  Skin:    General: Skin is warm and dry.     Capillary Refill: Capillary refill takes less than 2 seconds.  Neurological:     Mental Status: He is alert and oriented to person, place, and time.  Psychiatric:        Mood and Affect: Mood normal.        Behavior: Behavior normal.    Vitals:   09/13/20 1438  BP: (!) 158/94  Pulse: 80  Resp: 20  Temp: (!) 97.3 F (36.3 C)  SpO2: 97%      Assessment & Plan:   Frank Cook comes in today with chief complaint of Medical Management of Chronic Issues   Diagnosis and orders addressed:  1. Primary hypertension Continue medications as prescribed. Encouraged him to monitor his BP at home periodically.   - quinapril (ACCUPRIL) 40 MG tablet; 1 1/2 po daily  Dispense: 135 tablet; Refill: 1 - metoprolol succinate (TOPROL-XL) 100 MG 24 hr tablet; Take 1 tablet (100 mg total) by mouth daily. with food  Dispense: 90 tablet; Refill: 1 - hydrochlorothiazide (HYDRODIURIL) 25 MG tablet; Take 1 tablet (25 mg total) by mouth daily.  Dispense: 90 tablet; Refill: 1 - CBC with Differential/Platelet - CMP14+EGFR  2. Mixed hyperlipidemia Continue medication as prescribed.   - simvastatin (ZOCOR) 20 MG tablet; Take 1 tablet (20 mg total) by mouth daily.  Dispense: 90 tablet; Refill: 1 - Lipid panel  3. Longstanding persistent atrial fibrillation ([place]) Continue to follow with cardiology. Continue apixaban and metoprolol tartrate for management.   - apixaban (ELIQUIS) 2.5 MG TABS tablet; Take 1 tablet (2.5 mg total) by mouth 2 (two) times daily.  Dispense: 180 tablet; Refill: 1  4. PVD (peripheral vascular disease) ([place]) Take breaks when needed. Elevate  legs when sitting.   5. Gastroesophageal reflux disease without esophagitis Continue medication as prescribed. Avoiding triggers and eating late night.  - omeprazole (PRILOSEC) 20 MG capsule; TAKE (1) CAPSULE DAILY  Dispense: 90 capsule; Refill: 1  6. Vitamin D deficiency Continue supplement. Encouraged some outdoor time when the weather is nice.   7. BMI 27.0-27.9,adult Continue to eat what you enjoy. Discussed supplementing with boost or ensure on days he has decreased appetite.   8. Intermittent claudication of both lower extremities due to atherosclerosis ([place]) Continue to follow up with cardiology and taking rest  breaks when needed.   - cilostazol (PLETAL) 100 MG tablet; Take 1 tablet (100 mg total) by mouth 2 (two) times daily.  Dispense: 180 tablet; Refill: 1   Labs pending Health Maintenance reviewed Diet and exercise encouraged  Follow up plan: 3 months   Dollene Primrose, RN, BSN, FNP-Student  Mary-Margaret Hassell Done, FNP

## 2020-09-13 NOTE — Patient Instructions (Signed)
Fall Prevention in the Home, Adult Falls can cause injuries and can happen to people of all ages. There are many things you can do to make your home safe and to help prevent falls. Ask for help when making these changes. What actions can I take to prevent falls? General Instructions  Use good lighting in all rooms. Replace any light bulbs that burn out.  Turn on the lights in dark areas. Use night-lights.  Keep items that you use often in easy-to-reach places. Lower the shelves around your home if needed.  Set up your furniture so you have a clear path. Avoid moving your furniture around.  Do not have throw rugs or other things on the floor that can make you trip.  Avoid walking on wet floors.  If any of your floors are uneven, fix them.  Add color or contrast paint or tape to clearly mark and help you see: ? Grab bars or handrails. ? First and last steps of staircases. ? Where the edge of each step is.  If you use a stepladder: ? Make sure that it is fully opened. Do not climb a closed stepladder. ? Make sure the sides of the stepladder are locked in place. ? Ask someone to hold the stepladder while you use it.  Know where your pets are when moving through your home. What can I do in the bathroom?  Keep the floor dry. Clean up any water on the floor right away.  Remove soap buildup in the tub or shower.  Use nonskid mats or decals on the floor of the tub or shower.  Attach bath mats securely with double-sided, nonslip rug tape.  If you need to sit down in the shower, use a plastic, nonslip stool.  Install grab bars by the toilet and in the tub and shower. Do not use towel bars as grab bars.      What can I do in the bedroom?  Make sure that you have a light by your bed that is easy to reach.  Do not use any sheets or blankets for your bed that hang to the floor.  Have a firm chair with side arms that you can use for support when you get dressed. What can I do in  the kitchen?  Clean up any spills right away.  If you need to reach something above you, use a step stool with a grab bar.  Keep electrical cords out of the way.  Do not use floor polish or wax that makes floors slippery. What can I do with my stairs?  Do not leave any items on the stairs.  Make sure that you have a light switch at the top and the bottom of the stairs.  Make sure that there are handrails on both sides of the stairs. Fix handrails that are broken or loose.  Install nonslip stair treads on all your stairs.  Avoid having throw rugs at the top or bottom of the stairs.  Choose a carpet that does not hide the edge of the steps on the stairs.  Check carpeting to make sure that it is firmly attached to the stairs. Fix carpet that is loose or worn. What can I do on the outside of my home?  Use bright outdoor lighting.  Fix the edges of walkways and driveways and fix any cracks.  Remove anything that might make you trip as you walk through a door, such as a raised step or threshold.  Trim any   bushes or trees on paths to your home.  Check to see if handrails are loose or broken and that both sides of all steps have handrails.  Install guardrails along the edges of any raised decks and porches.  Clear paths of anything that can make you trip, such as tools or rocks.  Have leaves, snow, or ice cleared regularly.  Use sand or salt on paths during winter.  Clean up any spills in your garage right away. This includes grease or oil spills. What other actions can I take?  Wear shoes that: ? Have a low heel. Do not wear high heels. ? Have rubber bottoms. ? Feel good on your feet and fit well. ? Are closed at the toe. Do not wear open-toe sandals.  Use tools that help you move around if needed. These include: ? Canes. ? Walkers. ? Scooters. ? Crutches.  Review your medicines with your doctor. Some medicines can make you feel dizzy. This can increase your chance  of falling. Ask your doctor what else you can do to help prevent falls. Where to find more information  Centers for Disease Control and Prevention, STEAD[employer]: www.cdc.gov  National Institute on Aging: www.nia.nih.gov Contact a doctor if:  You are afraid of falling at home.  You feel weak, drowsy, or dizzy at home.  You fall at home. Summary  There are many simple things that you can do to make your home safe and to help prevent falls.  Ways to make your home safe include removing things that can make you trip and installing grab bars in the bathroom.  Ask for help when making these changes in your home. This information is not intended to replace advice given to you by your health care provider. Make sure you discuss any questions you have with your health care provider. Document Revised: 12/24/2019 Document Reviewed: 12/24/2019 Elsevier Patient Education  2021 Elsevier Inc.  

## 2020-09-14 LAB — LIPID PANEL
Chol/HDL Ratio: 2.4 ratio (ref 0.0–5.0)
Cholesterol, Total: 139 mg/dL (ref 100–199)
HDL: 57 mg/dL (ref >39)
LDL Chol Calc (NIH): 61 mg/dL (ref 0–99)
Triglycerides: 120 mg/dL (ref 0–149)
VLDL Cholesterol Cal: 21 mg/dL (ref 5–40)

## 2020-09-14 LAB — CMP14+EGFR
ALT: 11 IU/L (ref 0–44)
AST: 20 IU/L (ref 0–40)
Albumin/Globulin Ratio: 1.5 (ref 1.2–2.2)
Albumin: 4.6 g/dL (ref 3.6–4.6)
Alkaline Phosphatase: 40 IU/L — ABNORMAL LOW (ref 44–121)
BUN/Creatinine Ratio: 14 (ref 10–24)
BUN: 22 mg/dL (ref 8–27)
Bilirubin Total: 0.4 mg/dL (ref 0.0–1.2)
CO2: 24 mmol/L (ref 20–29)
Calcium: 10.1 mg/dL (ref 8.6–10.2)
Chloride: 101 mmol/L (ref 96–106)
Creatinine, Ser: 1.55 mg/dL — ABNORMAL HIGH (ref 0.76–1.27)
Globulin, Total: 3 g/dL (ref 1.5–4.5)
Glucose: 96 mg/dL (ref 65–99)
Potassium: 4.1 mmol/L (ref 3.5–5.2)
Sodium: 144 mmol/L (ref 134–144)
Total Protein: 7.6 g/dL (ref 6.0–8.5)
eGFR: 43 mL/min/{1.73_m2} — ABNORMAL LOW (ref >59)

## 2020-09-14 LAB — CBC WITH DIFFERENTIAL/PLATELET
Basophils Absolute: 0 10*3/uL (ref 0.0–0.2)
Basos: 1 %
EOS (ABSOLUTE): 0.2 10*3/uL (ref 0.0–0.4)
Eos: 3 %
Hematocrit: 41.8 % (ref 37.5–51.0)
Hemoglobin: 14.3 g/dL (ref 13.0–17.7)
Immature Grans (Abs): 0 10*3/uL (ref 0.0–0.1)
Immature Granulocytes: 1 %
Lymphocytes Absolute: 1.5 10*3/uL (ref 0.7–3.1)
Lymphs: 23 %
MCH: 30.8 pg (ref 26.6–33.0)
MCHC: 34.2 g/dL (ref 31.5–35.7)
MCV: 90 fL (ref 79–97)
Monocytes Absolute: 0.5 10*3/uL (ref 0.1–0.9)
Monocytes: 8 %
Neutrophils Absolute: 4.2 10*3/uL (ref 1.4–7.0)
Neutrophils: 64 %
Platelets: 244 10*3/uL (ref 150–450)
RBC: 4.64 x10E6/uL (ref 4.14–5.80)
RDW: 13.6 % (ref 11.6–15.4)
WBC: 6.5 10*3/uL (ref 3.4–10.8)

## 2020-09-16 ENCOUNTER — Ambulatory Visit: Payer: Self-pay | Admitting: Nurse Practitioner

## 2020-10-26 DIAGNOSIS — Z23 Encounter for immunization: Secondary | ICD-10-CM | POA: Diagnosis not present

## 2020-12-14 ENCOUNTER — Encounter: Payer: Self-pay | Admitting: Nurse Practitioner

## 2020-12-14 ENCOUNTER — Ambulatory Visit (INDEPENDENT_AMBULATORY_CARE_PROVIDER_SITE_OTHER): Payer: Medicare Other | Admitting: Nurse Practitioner

## 2020-12-14 ENCOUNTER — Other Ambulatory Visit: Payer: Self-pay

## 2020-12-14 VITALS — BP 131/82 | HR 85 | Temp 98.0°F | Resp 20 | Ht 70.0 in | Wt 185.0 lb

## 2020-12-14 DIAGNOSIS — E559 Vitamin D deficiency, unspecified: Secondary | ICD-10-CM

## 2020-12-14 DIAGNOSIS — I739 Peripheral vascular disease, unspecified: Secondary | ICD-10-CM

## 2020-12-14 DIAGNOSIS — I4811 Longstanding persistent atrial fibrillation: Secondary | ICD-10-CM | POA: Diagnosis not present

## 2020-12-14 DIAGNOSIS — K219 Gastro-esophageal reflux disease without esophagitis: Secondary | ICD-10-CM | POA: Diagnosis not present

## 2020-12-14 DIAGNOSIS — I70213 Atherosclerosis of native arteries of extremities with intermittent claudication, bilateral legs: Secondary | ICD-10-CM | POA: Diagnosis not present

## 2020-12-14 DIAGNOSIS — I10 Essential (primary) hypertension: Secondary | ICD-10-CM | POA: Diagnosis not present

## 2020-12-14 DIAGNOSIS — Z6827 Body mass index (BMI) 27.0-27.9, adult: Secondary | ICD-10-CM

## 2020-12-14 DIAGNOSIS — E782 Mixed hyperlipidemia: Secondary | ICD-10-CM

## 2020-12-14 MED ORDER — HYDROCHLOROTHIAZIDE 25 MG PO TABS: 25.0000 mg | ORAL_TABLET | Freq: Every day | ORAL | 1 refills | Status: DC

## 2020-12-14 MED ORDER — QUINAPRIL HCL 40 MG PO TABS: ORAL_TABLET | ORAL | 1 refills | Status: DC

## 2020-12-14 MED ORDER — SIMVASTATIN 20 MG PO TABS: 20.0000 mg | ORAL_TABLET | Freq: Every day | ORAL | 1 refills | Status: DC

## 2020-12-14 MED ORDER — CILOSTAZOL 100 MG PO TABS: 100.0000 mg | ORAL_TABLET | Freq: Two times a day (BID) | ORAL | 1 refills | Status: DC

## 2020-12-14 MED ORDER — METOPROLOL SUCCINATE ER 100 MG PO TB24: 100.0000 mg | ORAL_TABLET | Freq: Every day | ORAL | 1 refills | Status: DC

## 2020-12-14 MED ORDER — APIXABAN 2.5 MG PO TABS: ORAL_TABLET | ORAL | 1 refills | Status: DC

## 2020-12-14 NOTE — Progress Notes (Signed)
Subjective:    Patient ID: Frank Cook, male    DOB: June 22, 1931, 87 y.o.   MRN: [id number]   Chief Complaint: Medical Management of Chronic Issues    HPI:  1. HYPERTENSION, BENIGN No c/o chest pain, sob or headache. Does not check blood pressure at home. BP Readings from Last 3 Encounters:  12/14/20 131/82  09/13/20 (!) 158/94  06/17/20 138/82     2. Mixed hyperlipidemia He does not watch diet. He eats whatever is brought to him. Lab Results  Component Value Date   CHOL 139 09/13/2020   HDL 57 09/13/2020   LDLCALC 61 09/13/2020   TRIG 120 09/13/2020   CHOLHDL 2.4 09/13/2020     3. Longstanding persistent atrial fibrillation ([facility]) No c/o palpitation or heart racing.  4. PVD (peripheral vascular disease) ([facility]) No lower ext cramping. He is on eliquis.  5. Gastroesophageal reflux disease without esophagitis The omeprazole works well to keep symptoms under control  6. Vitamin D deficiency Is on daily vitamin d supplement  7. BMI 27.0-27.9,adult No recent weight changes. Wt Readings from Last 3 Encounters:  12/14/20 185 lb (83.9 kg)  09/13/20 188 lb (85.3 kg)  06/17/20 187 lb 12.8 oz (85.2 kg)   BMI Readings from Last 3 Encounters:  12/14/20 26.54 kg/m  09/13/20 26.98 kg/m  06/17/20 26.95 kg/m       Outpatient Encounter Medications as of 12/14/2020  Medication Sig   apixaban (ELIQUIS) 2.5 MG TABS tablet Take 1 tablet (2.5 mg total) by mouth 2 (two) times daily.   Cholecalciferol (VITAMIN D3) 5000 units CAPS Take 5,000 Units by mouth daily.   cilostazol (PLETAL) 100 MG tablet Take 1 tablet (100 mg total) by mouth 2 (two) times daily.   Glucosamine 500 MG CAPS Take 1,500 mg by mouth daily.   hydrochlorothiazide (HYDRODIURIL) 25 MG tablet Take 1 tablet (25 mg total) by mouth daily.   latanoprost (XALATAN) 0.005 % ophthalmic solution Place 1 drop into both eyes at bedtime.   metoprolol succinate (TOPROL-XL) 100 MG 24 hr tablet Take 1 tablet (100 mg  total) by mouth daily. with food   Omega 3 1000 MG CAPS Take 2,000 mg by mouth daily.   omeprazole (PRILOSEC) 20 MG capsule TAKE (1) CAPSULE DAILY   quinapril (ACCUPRIL) 40 MG tablet 1 1/2 po daily   simvastatin (ZOCOR) 20 MG tablet Take 1 tablet (20 mg total) by mouth daily.   No facility-administered encounter medications on file as of 12/14/2020.    Past Surgical History:  Procedure Laterality Date   CATARACT EXTRACTION Bilateral    COLONOSCOPY     None      Family History  Problem Relation Age of Onset   COPD Mother    Cancer Father        Bone   Cancer Brother        Bone   Stroke Maternal Grandfather    Arthritis Brother    Hypertension Brother     New complaints: None today  Social history: Lives by hisself- family checks on hi daily  Controlled substance contract: n/a     Review of Systems  Constitutional:  Negative for diaphoresis.  Eyes:  Negative for pain.  Respiratory:  Negative for shortness of breath.   Cardiovascular:  Negative for chest pain, palpitations and leg swelling.  Gastrointestinal:  Negative for abdominal pain.  Endocrine: Negative for polydipsia.  Skin:  Negative for rash.  Neurological:  Negative for dizziness, weakness and headaches.  Hematological:  Does not bruise/bleed easily.  All other systems reviewed and are negative.     Objective:   Physical Exam Vitals and nursing note reviewed.  Constitutional:      Appearance: Normal appearance. He is well-developed.  HENT:     Head: Normocephalic.     Nose: Nose normal.  Eyes:     Pupils: Pupils are equal, round, and reactive to light.  Neck:     Thyroid: No thyroid mass or thyromegaly.     Vascular: No carotid bruit or JVD.     Trachea: Phonation normal.  Cardiovascular:     Rate and Rhythm: Normal rate. Rhythm irregular.     Heart sounds: Murmur (2/6) heard.  Pulmonary:     Effort: Pulmonary effort is normal. No respiratory distress.     Breath sounds: Normal breath  sounds.  Abdominal:     General: Bowel sounds are normal.     Palpations: Abdomen is soft.     Tenderness: There is no abdominal tenderness.  Musculoskeletal:        General: Normal range of motion.     Cervical back: Normal range of motion and neck supple.  Lymphadenopathy:     Cervical: No cervical adenopathy.  Skin:    General: Skin is warm and dry.  Neurological:     Mental Status: He is alert and oriented to person, place, and time.  Psychiatric:        Behavior: Behavior normal.        Thought Content: Thought content normal.        Judgment: Judgment normal.    BP 131/82   Pulse 85   Temp 98 F (36.7 C) (Temporal)   Resp 20   Ht 5\' 10"  (1.778 m)   Wt 185 lb (83.9 kg)   SpO2 94%   BMI 26.54 kg/m        Assessment & Plan:  Frank Cook comes in today with chief complaint of Medical Management of Chronic Issues   Diagnosis and orders addressed:  1. Primary hypertension Low sodium diet - metoprolol succinate (TOPROL-XL) 100 MG 24 hr tablet; Take 1 tablet (100 mg total) by mouth daily. with food  Dispense: 90 tablet; Refill: 1 - hydrochlorothiazide (HYDRODIURIL) 25 MG tablet; Take 1 tablet (25 mg total) by mouth daily.  Dispense: 90 tablet; Refill: 1 - quinapril (ACCUPRIL) 40 MG tablet; 1 1/2 po daily  Dispense: 135 tablet; Refill: 1 - CBC with Differential/Platelet - CMP14+EGFR  2. Mixed hyperlipidemia Low fat diet - simvastatin (ZOCOR) 20 MG tablet; Take 1 tablet (20 mg total) by mouth daily.  Dispense: 90 tablet; Refill: 1 - Lipid panel  3. Longstanding persistent atrial fibrillation ([facility]) Avoid caffeine - apixaban (ELIQUIS) 2.5 MG TABS tablet; Take 1 tablet (2.5 mg total) by mouth 2 (two) times daily.  Dispense: 180 tablet; Refill: 1  4. PVD (peripheral vascular disease) ([facility]) Continue eliquis as prescibed  5. Gastroesophageal reflux disease without esophagitis Avoid spicy foods Do not eat 2 hours prior to bedtime  6. Vitamin D  deficiency Continue vitamind supplement  7. BMI 27.0-27.9,adult Discussed diet and exercise for person with BMI >25 Will recheck weight in 3-6 months 8. Intermittent claudication of both lower extremities due to atherosclerosis ([facility]) - cilostazol (PLETAL) 100 MG tablet; Take 1 tablet (100 mg total) by mouth 2 (two) times daily.  Dispense: 180 tablet; Refill: 1   Labs pending Health Maintenance reviewed Diet and exercise encouraged  Follow up plan: 6 months  Mary-Margaret Shivaun Bilello, FNP   

## 2020-12-14 NOTE — Patient Instructions (Signed)
Atrial Fibrillation °Atrial fibrillation is a type of heartbeat that is irregular or fast. If you have this condition, your heart beats without any order. This makes it hard for your heart to pump blood in a normal way. °Atrial fibrillation may come and go, or it may become a long-lasting problem. If this condition is not treated, it can put you at higher risk for stroke, heart failure, and other heart problems. °What are the causes? °This condition may be caused by diseases that damage the heart. They include: °High blood pressure. °Heart failure. °Heart valve disease. °Heart surgery. °Other causes include: °Diabetes. °Thyroid disease. °Being overweight. °Kidney disease. °Sometimes the cause is not known. °What increases the risk? °You are more likely to develop this condition if: °You are older. °You smoke. °You exercise often and very hard. °You have a family history of this condition. °You are a man. °You use drugs. °You drink a lot of alcohol. °You have lung conditions, such as emphysema, pneumonia, or COPD. °You have sleep apnea. °What are the signs or symptoms? °Common symptoms of this condition include: °A feeling that your heart is beating very fast. °Chest pain or discomfort. °Feeling short of breath. °Suddenly feeling light-headed or weak. °Getting tired easily during activity. °Fainting. °Sweating. °In some cases, there are no symptoms. °How is this treated? °Treatment for this condition depends on underlying conditions and how you feel when you have atrial fibrillation. They include: °Medicines to: °Prevent blood clots. °Treat heart rate or heart rhythm problems. °Using devices, such as a pacemaker, to correct heart rhythm problems. °Doing surgery to remove the part of the heart that sends bad signals. °Closing an area where clots can form in the heart (left atrial appendage). °In some cases, your doctor will treat other underlying conditions. °Follow these instructions at home: °Medicines °Take  over-the-counter and prescription medicines only as told by your doctor. °Do not take any new medicines without first talking to your doctor. °If you are taking blood thinners: °Talk with your doctor before you take any medicines that have aspirin or NSAIDs, such as ibuprofen, in them. °Take your medicine exactly as told by your doctor. Take it at the same time each day. °Avoid activities that could hurt or bruise you. Follow instructions about how to prevent falls. °Wear a bracelet that says you are taking blood thinners. Or, carry a card that lists what medicines you take. °Lifestyle °  °Do not use any products that have nicotine or tobacco in them. These include cigarettes, e-cigarettes, and chewing tobacco. If you need help quitting, ask your doctor. °Eat heart-healthy foods. Talk with your doctor about the right eating plan for you. °Exercise regularly as told by your doctor. °Do not drink alcohol. °Lose weight if you are overweight. °Do not use drugs, including cannabis. °General instructions °If you have a condition that causes breathing to stop for a short period of time (apnea), treat it as told by your doctor. °Keep a healthy weight. Do not use diet pills unless your doctor says they are safe for you. Diet pills may make heart problems worse. °Keep all follow-up visits as told by your doctor. This is important. °Contact a doctor if: °You notice a change in the speed, rhythm, or strength of your heartbeat. °You are taking a blood-thinning medicine and you get more bruising. °You get tired more easily when you move or exercise. °You have a sudden change in weight. °Get help right away if: ° °You have pain in your chest or   your belly (abdomen). °You have trouble breathing. °You have side effects of blood thinners, such as blood in your vomit, poop (stool), or pee (urine), or bleeding that cannot stop. °You have any signs of a stroke. "BE FAST" is an easy way to remember the main warning signs: °B - Balance.  Signs are dizziness, sudden trouble walking, or loss of balance. °E - Eyes. Signs are trouble seeing or a change in how you see. °F - Face. Signs are sudden weakness or loss of feeling in the face, or the face or eyelid drooping on one side. °A - Arms. Signs are weakness or loss of feeling in an arm. This happens suddenly and usually on one side of the body. °S - Speech. Signs are sudden trouble speaking, slurred speech, or trouble understanding what people say. °T - Time. Time to call emergency services. Write down what time symptoms started. °You have other signs of a stroke, such as: °A sudden, very bad headache with no known cause. °Feeling like you may vomit (nausea). °Vomiting. °A seizure. °These symptoms may be an emergency. Do not wait to see if the symptoms will go away. Get medical help right away. Call your local emergency services (911 in the [place] Do not drive yourself to the hospital. °Summary °Atrial fibrillation is a type of heartbeat that is irregular or fast. °You are at higher risk of this condition if you smoke, are older, have diabetes, or are overweight. °Follow your doctor's instructions about medicines, diet, exercise, and follow-up visits. °Get help right away if you have signs or symptoms of a stroke. °Get help right away if you cannot catch your breath, or you have chest pain or discomfort. °This information is not intended to replace advice given to you by your health care provider. Make sure you discuss any questions you have with your health care provider. °Document Revised: 11/13/2018 Document Reviewed: 11/13/2018 °Elsevier Patient Education © 2022 [employer] ° °

## 2020-12-15 LAB — LIPID PANEL
Chol/HDL Ratio: 2.4 ratio (ref 0.0–5.0)
Cholesterol, Total: 124 mg/dL (ref 100–199)
HDL: 52 mg/dL (ref >39)
LDL Chol Calc (NIH): 41 mg/dL (ref 0–99)
Triglycerides: 192 mg/dL — ABNORMAL HIGH (ref 0–149)
VLDL Cholesterol Cal: 31 mg/dL (ref 5–40)

## 2020-12-15 LAB — CBC WITH DIFFERENTIAL/PLATELET
Basophils Absolute: 0 10*3/uL (ref 0.0–0.2)
Basos: 1 %
EOS (ABSOLUTE): 0.3 10*3/uL (ref 0.0–0.4)
Eos: 4 %
Hematocrit: 42.6 % (ref 37.5–51.0)
Hemoglobin: 14.6 g/dL (ref 13.0–17.7)
Immature Grans (Abs): 0 10*3/uL (ref 0.0–0.1)
Immature Granulocytes: 1 %
Lymphocytes Absolute: 1.7 10*3/uL (ref 0.7–3.1)
Lymphs: 26 %
MCH: 31 pg (ref 26.6–33.0)
MCHC: 34.3 g/dL (ref 31.5–35.7)
MCV: 90 fL (ref 79–97)
Monocytes Absolute: 0.5 10*3/uL (ref 0.1–0.9)
Monocytes: 8 %
Neutrophils Absolute: 4 10*3/uL (ref 1.4–7.0)
Neutrophils: 60 %
Platelets: 211 10*3/uL (ref 150–450)
RBC: 4.71 x10E6/uL (ref 4.14–5.80)
RDW: 13.4 % (ref 11.6–15.4)
WBC: 6.6 10*3/uL (ref 3.4–10.8)

## 2020-12-15 LAB — CMP14+EGFR
ALT: 15 IU/L (ref 0–44)
AST: 21 IU/L (ref 0–40)
Albumin/Globulin Ratio: 1.6 (ref 1.2–2.2)
Albumin: 4.6 g/dL (ref 3.6–4.6)
Alkaline Phosphatase: 42 IU/L — ABNORMAL LOW (ref 44–121)
BUN/Creatinine Ratio: 14 (ref 10–24)
BUN: 20 mg/dL (ref 8–27)
Bilirubin Total: 0.4 mg/dL (ref 0.0–1.2)
CO2: 22 mmol/L (ref 20–29)
Calcium: 9.9 mg/dL (ref 8.6–10.2)
Chloride: 99 mmol/L (ref 96–106)
Creatinine, Ser: 1.42 mg/dL — ABNORMAL HIGH (ref 0.76–1.27)
Globulin, Total: 2.8 g/dL (ref 1.5–4.5)
Glucose: 131 mg/dL — ABNORMAL HIGH (ref 65–99)
Potassium: 4 mmol/L (ref 3.5–5.2)
Sodium: 138 mmol/L (ref 134–144)
Total Protein: 7.4 g/dL (ref 6.0–8.5)
eGFR: 48 mL/min/{1.73_m2} — ABNORMAL LOW (ref >59)

## 2021-01-10 DIAGNOSIS — H401131 Primary open-angle glaucoma, bilateral, mild stage: Secondary | ICD-10-CM | POA: Diagnosis not present

## 2021-01-11 DIAGNOSIS — N1832 Chronic kidney disease, stage 3b: Secondary | ICD-10-CM | POA: Insufficient documentation

## 2021-01-11 NOTE — Progress Notes (Signed)
Cardiology Office Note   Date:  01/12/2021   ID:  Raynard, Mapps June 10, 1931, MRN [id number]  PCP:  Bennie Pierini, FNP  Cardiologist:   None   No chief complaint on file.     History of Present Illness: Frank Cook is a 87 y.o. male who presents for follow-up of atrial fibrillation.  Since I last saw him he has done well.  His wife lives at [facility] with dementia.  He lives by himself.  He does some of his household chores including going up and down stairs to do his laundry.The patient denies any new symptoms such as chest discomfort, neck or arm discomfort. There has been no new shortness of breath, PND or orthopnea. There have been no reported palpitations, presyncope or syncope.  He does not notice his atrial fibrillation.  He does not feel any palpitations.  He tolerates his anticoagulation.   Past Medical History:  Diagnosis Date   Atrial fibrillation ([facility])    Colon polyps    GERD (gastroesophageal reflux disease)    Hyperlipidemia    Hypertension    Osteopenia     Past Surgical History:  Procedure Laterality Date   CATARACT EXTRACTION Bilateral    COLONOSCOPY     None       Current Outpatient Medications  Medication Sig Dispense Refill   apixaban (ELIQUIS) 2.5 MG TABS tablet Take 1 tablet (2.5 mg total) by mouth 2 (two) times daily. 180 tablet 1   Cholecalciferol (VITAMIN D3) 5000 units CAPS Take 5,000 Units by mouth daily.     cilostazol (PLETAL) 100 MG tablet Take 1 tablet (100 mg total) by mouth 2 (two) times daily. 180 tablet 1   Glucosamine 500 MG CAPS Take 1,500 mg by mouth daily.     hydrochlorothiazide (HYDRODIURIL) 25 MG tablet Take 1 tablet (25 mg total) by mouth daily. 90 tablet 1   latanoprost (XALATAN) 0.005 % ophthalmic solution Place 1 drop into both eyes at bedtime.     metoprolol succinate (TOPROL-XL) 100 MG 24 hr tablet Take 1 tablet (100 mg total) by mouth daily. with food 90 tablet 1   Omega 3 1000 MG CAPS Take 2,000 mg by  mouth daily.     omeprazole (PRILOSEC) 20 MG capsule TAKE (1) CAPSULE DAILY 90 capsule 1   quinapril (ACCUPRIL) 40 MG tablet 1 1/2 po daily 135 tablet 1   simvastatin (ZOCOR) 20 MG tablet Take 1 tablet (20 mg total) by mouth daily. 90 tablet 1   No current facility-administered medications for this visit.    Allergies:   Patient has no known allergies.    ROS:  Please see the history of present illness.   Otherwise, review of systems are positive for none.   All other systems are reviewed and negative.    PHYSICAL EXAM: VS:  BP 110/70   Pulse 87   Ht 5\' 10"  (1.778 m)   Wt 184 lb (83.5 kg)   BMI 26.40 kg/m  , BMI Body mass index is 26.4 kg/m. GENERAL:  Well appearing NECK:  No jugular venous distention, waveform within normal limits, carotid upstroke brisk and symmetric, no bruits, no thyromegaly LUNGS:  Clear to auscultation bilaterally CHEST:  Unremarkable HEART:  PMI not displaced or sustained,S1 and S2 within normal limits, no S3, no clicks, no rubs, no murmurs, irregular ABD:  Flat, positive bowel sounds normal in frequency in pitch, no bruits, no rebound, no guarding, no midline pulsatile mass, no hepatomegaly,  no splenomegaly EXT:  2 plus pulses throughout, no edema, no cyanosis no clubbing  EKG:  EKG is  ordered today. The ekg ordered today demonstrates atrial fibrillation, rate 87, right bundle branch block, no acute ST-T wave changes, no change from previous.   Recent Labs: 12/14/2020: ALT 15; BUN 20; Creatinine, Ser 1.42; Hemoglobin 14.6; Platelets 211; Potassium 4.0; Sodium 138    Lipid Panel    Component Value Date/Time   CHOL 124 12/14/2020 1443   CHOL 135 10/24/2012 1056   TRIG 192 (H) 12/14/2020 1443   TRIG 112 09/17/2014 1141   TRIG 182 (H) 10/24/2012 1056   HDL 52 12/14/2020 1443   HDL 56 09/17/2014 1141   HDL 49 10/24/2012 1056   CHOLHDL 2.4 12/14/2020 1443   LDLCALC 41 12/14/2020 1443   LDLCALC 42 03/16/2014 1101   LDLCALC 50 10/24/2012 1056       Wt Readings from Last 3 Encounters:  01/12/21 184 lb (83.5 kg)  12/14/20 185 lb (83.9 kg)  09/13/20 188 lb (85.3 kg)      Other studies Reviewed: Additional studies/ records that were reviewed today include: Labs. Review of the above records demonstrates: See elsewhere   ASSESSMENT AND PLAN:  ATRIAL FIB:  Frank Cook has a CHA2DS2 - VASc score of 3.  He is on a lower dose of anticoagulation because his creatinine runs around 1.5 most of the time.  I discussed this with the patient and his daughter and they understand this decision making.   HTN: His blood pressure is well controlled.  No change in therapy.   CKD: His creatinine hovers around 1.42 but he has been 1.5 recently.   LEG PAIN:   He had normal ABIs recently.   No change in therapy.    Current medicines are reviewed at length with the patient today.  The patient does not have concerns regarding medicines.  The following changes have been made: None  Labs/ tests ordered today include: none  No orders of the defined types were placed in this encounter.    Disposition:   FU with me in one year   Signed, Rollene Rotunda, MD  01/12/2021 2:40 PM    [facility]

## 2021-01-12 ENCOUNTER — Ambulatory Visit (INDEPENDENT_AMBULATORY_CARE_PROVIDER_SITE_OTHER): Payer: Medicare Other | Admitting: Cardiology

## 2021-01-12 ENCOUNTER — Encounter: Payer: Self-pay | Admitting: Cardiology

## 2021-01-12 ENCOUNTER — Other Ambulatory Visit: Payer: Self-pay

## 2021-01-12 VITALS — BP 110/70 | HR 87 | Ht 70.0 in | Wt 184.0 lb

## 2021-01-12 DIAGNOSIS — I10 Essential (primary) hypertension: Secondary | ICD-10-CM

## 2021-01-12 DIAGNOSIS — I482 Chronic atrial fibrillation, unspecified: Secondary | ICD-10-CM | POA: Diagnosis not present

## 2021-01-12 DIAGNOSIS — N1832 Chronic kidney disease, stage 3b: Secondary | ICD-10-CM

## 2021-01-12 DIAGNOSIS — I70213 Atherosclerosis of native arteries of extremities with intermittent claudication, bilateral legs: Secondary | ICD-10-CM | POA: Diagnosis not present

## 2021-01-12 NOTE — Patient Instructions (Signed)
Medication Instructions:  The current medical regimen is effective;  continue present plan and medications.  *If you need a refill on your cardiac medications before your next appointment, please call your pharmacy*  Follow-Up: At [facility], you and your health needs are our priority.  As part of our continuing mission to provide you with exceptional heart care, we have created designated Provider Care Teams.  These Care Teams include your primary Cardiologist (physician) and Advanced Practice Providers (APPs -  Physician Assistants and Nurse Practitioners) who all work together to provide you with the care you need, when you need it.  We recommend signing up for the patient portal called "MyChart".  Sign up information is provided on this After Visit Summary.  MyChart is used to connect with patients for Virtual Visits (Telemedicine).  Patients are able to view lab/test results, encounter notes, upcoming appointments, etc.  Non-urgent messages can be sent to your provider as well.   To learn more about what you can do with MyChart, go to [email or web address].    Your next appointment:   1 year(s)  The format for your next appointment:   In Person  Provider:   James Hochrein, MD   Thank you for choosing [facility]!!    

## 2021-04-05 DIAGNOSIS — Z23 Encounter for immunization: Secondary | ICD-10-CM | POA: Diagnosis not present

## 2021-05-16 ENCOUNTER — Other Ambulatory Visit: Payer: Self-pay | Admitting: Nurse Practitioner

## 2021-05-16 DIAGNOSIS — E782 Mixed hyperlipidemia: Secondary | ICD-10-CM

## 2021-05-16 DIAGNOSIS — I10 Essential (primary) hypertension: Secondary | ICD-10-CM

## 2021-05-16 DIAGNOSIS — I70213 Atherosclerosis of native arteries of extremities with intermittent claudication, bilateral legs: Secondary | ICD-10-CM

## 2021-06-17 ENCOUNTER — Ambulatory Visit (INDEPENDENT_AMBULATORY_CARE_PROVIDER_SITE_OTHER): Payer: Medicare Other | Admitting: Nurse Practitioner

## 2021-06-17 ENCOUNTER — Encounter: Payer: Self-pay | Admitting: Nurse Practitioner

## 2021-06-17 VITALS — BP 132/72 | HR 87 | Temp 97.1°F | Resp 20 | Ht 70.0 in | Wt 185.0 lb

## 2021-06-17 DIAGNOSIS — K219 Gastro-esophageal reflux disease without esophagitis: Secondary | ICD-10-CM | POA: Diagnosis not present

## 2021-06-17 DIAGNOSIS — I10 Essential (primary) hypertension: Secondary | ICD-10-CM

## 2021-06-17 DIAGNOSIS — E559 Vitamin D deficiency, unspecified: Secondary | ICD-10-CM

## 2021-06-17 DIAGNOSIS — I482 Chronic atrial fibrillation, unspecified: Secondary | ICD-10-CM | POA: Diagnosis not present

## 2021-06-17 DIAGNOSIS — E782 Mixed hyperlipidemia: Secondary | ICD-10-CM | POA: Diagnosis not present

## 2021-06-17 DIAGNOSIS — Z6827 Body mass index (BMI) 27.0-27.9, adult: Secondary | ICD-10-CM

## 2021-06-17 DIAGNOSIS — N1832 Chronic kidney disease, stage 3b: Secondary | ICD-10-CM | POA: Diagnosis not present

## 2021-06-17 DIAGNOSIS — I739 Peripheral vascular disease, unspecified: Secondary | ICD-10-CM

## 2021-06-17 DIAGNOSIS — Z23 Encounter for immunization: Secondary | ICD-10-CM

## 2021-06-17 MED ORDER — HYDROCHLOROTHIAZIDE 25 MG PO TABS: 25.0000 mg | ORAL_TABLET | Freq: Every day | ORAL | 1 refills | Status: DC

## 2021-06-17 MED ORDER — APIXABAN 2.5 MG PO TABS: ORAL_TABLET | ORAL | 1 refills | Status: DC

## 2021-06-17 MED ORDER — METOPROLOL SUCCINATE ER 100 MG PO TB24: 100.0000 mg | ORAL_TABLET | Freq: Every day | ORAL | 1 refills | Status: DC

## 2021-06-17 MED ORDER — QUINAPRIL HCL 40 MG PO TABS: ORAL_TABLET | ORAL | 1 refills | Status: DC

## 2021-06-17 MED ORDER — CILOSTAZOL 100 MG PO TABS: 100.0000 mg | ORAL_TABLET | Freq: Two times a day (BID) | ORAL | 1 refills | Status: DC

## 2021-06-17 MED ORDER — SIMVASTATIN 20 MG PO TABS: 20.0000 mg | ORAL_TABLET | Freq: Every day | ORAL | 1 refills | Status: DC

## 2021-06-17 NOTE — Addendum Note (Signed)
Addended by: Cleda Daub on: 06/17/2021 04:34 PM   Modules accepted: Orders

## 2021-06-17 NOTE — Patient Instructions (Signed)

## 2021-06-17 NOTE — Progress Notes (Signed)
Subjective:    Patient ID: Frank Cook, male    DOB: December 19, 1931, 88 y.o.   MRN: [id number]   Chief Complaint: medical management of chronic issues     HPI:  Frank Cook is a 88 y.o. who identifies as a male who was assigned male at birth.   Social history: Lives with: by hisself. His wife is in a nursing facility Work history: retired   Scientist, forensic in today for follow up of the following chronic medical issues:  1. Primary hypertension No c/o chest pain, sob or headache. Does not check blood pressure at home. BP Readings from Last 3 Encounters:  01/12/21 110/70  12/14/20 131/82  09/13/20 (!) 158/94     2. Chronic atrial fibrillation (HCC) No c/o heart palpitations or heart racing. Is on eliquis with no known bleeding issues.   3. PVD (peripheral vascular disease) ([place]) Has edema of lower ext at times. Is on pletal and denies any cramping  4. Mixed hyperlipidemia Does try to watch diet but is not able to do any exercise. He does try to stay somewhat active. Lab Results  Component Value Date   CHOL 124 12/14/2020   HDL 52 12/14/2020   LDLCALC 41 12/14/2020   TRIG 192 (H) 12/14/2020   CHOLHDL 2.4 12/14/2020     5. Gastroesophageal reflux disease without esophagitis Takes omeprazole daily and works well to keep symptoms under control.  6. Stage 3b chronic kidney disease ([place]) Lab Results  Component Value Date   CREATININE 1.42 (H) 12/14/2020     7. Vitamin D deficiency Is on daily vitamin d supplement  8. BMI 27.0-27.9,adult No recent weight changes Wt Readings from Last 3 Encounters:  06/17/21 185 lb (83.9 kg)  01/12/21 184 lb (83.5 kg)  12/14/20 185 lb (83.9 kg)   BMI Readings from Last 3 Encounters:  06/17/21 26.54 kg/m  01/12/21 26.40 kg/m  12/14/20 26.54 kg/m      New complaints: None today  No Known Allergies Outpatient Encounter Medications as of 06/17/2021  Medication Sig   apixaban (ELIQUIS) 2.5 MG TABS tablet Take 1 tablet (2.5  mg total) by mouth 2 (two) times daily.   Cholecalciferol (VITAMIN D3) 5000 units CAPS Take 5,000 Units by mouth daily.   cilostazol (PLETAL) 100 MG tablet TAKE 1 TABLET 2 TIMES A DAY   Glucosamine 500 MG CAPS Take 1,500 mg by mouth daily.   hydrochlorothiazide (HYDRODIURIL) 25 MG tablet TAKE 1 TABLET DAILY   latanoprost (XALATAN) 0.005 % ophthalmic solution Place 1 drop into both eyes at bedtime.   metoprolol succinate (TOPROL-XL) 100 MG 24 hr tablet TAKE 1 TABLET DAILY WITH FOOD   Omega 3 1000 MG CAPS Take 2,000 mg by mouth daily.   omeprazole (PRILOSEC) 20 MG capsule TAKE (1) CAPSULE DAILY   quinapril (ACCUPRIL) 40 MG tablet 1 1/2 po daily   simvastatin (ZOCOR) 20 MG tablet TAKE 1 TABLET DAILY   No facility-administered encounter medications on file as of 06/17/2021.    Past Surgical History:  Procedure Laterality Date   CATARACT EXTRACTION Bilateral    COLONOSCOPY     None      Family History  Problem Relation Age of Onset   COPD Mother    Cancer Father        Bone   Cancer Brother        Bone   Stroke Maternal Grandfather    Arthritis Brother    Hypertension Brother  Controlled substance contract: n/a     Review of Systems  Constitutional:  Negative for diaphoresis.  Eyes:  Negative for pain.  Respiratory:  Negative for shortness of breath.   Cardiovascular:  Negative for chest pain, palpitations and leg swelling.  Gastrointestinal:  Negative for abdominal pain.  Endocrine: Negative for polydipsia.  Skin:  Negative for rash.  Neurological:  Negative for dizziness, weakness and headaches.  Hematological:  Does not bruise/bleed easily.  All other systems reviewed and are negative.     Objective:   Physical Exam Vitals and nursing note reviewed.  Constitutional:      Appearance: Normal appearance. He is well-developed.  HENT:     Head: Normocephalic.     Nose: Nose normal.     Mouth/Throat:     Mouth: Mucous membranes are moist.     Pharynx:  Oropharynx is clear.  Eyes:     Pupils: Pupils are equal, round, and reactive to light.  Neck:     Thyroid: No thyroid mass or thyromegaly.     Vascular: No carotid bruit or JVD.     Trachea: Phonation normal.  Cardiovascular:     Rate and Rhythm: Normal rate and regular rhythm.  Pulmonary:     Effort: Pulmonary effort is normal. No respiratory distress.     Breath sounds: Normal breath sounds.  Abdominal:     General: Bowel sounds are normal.     Palpations: Abdomen is soft.     Tenderness: There is no abdominal tenderness.  Musculoskeletal:        General: Normal range of motion.     Cervical back: Normal range of motion and neck supple.  Lymphadenopathy:     Cervical: No cervical adenopathy.  Skin:    General: Skin is warm and dry.  Neurological:     Mental Status: He is alert and oriented to person, place, and time.  Psychiatric:        Behavior: Behavior normal.        Thought Content: Thought content normal.        Judgment: Judgment normal.    BP 132/72    Pulse 87    Temp (!) 97.1 F (36.2 C) (Temporal)    Resp 20    Ht 5' 10"  (1.778 m)    Wt 185 lb (83.9 kg)    SpO2 99%    BMI 26.54 kg/m        Assessment & Plan:  [name] comes in today with chief complaint of Medical Management of Chronic Issues   Diagnosis and orders addressed:  1. Primary hypertension Low sodium diet - metoprolol succinate (TOPROL-XL) 100 MG 24 hr tablet; Take 1 tablet (100 mg total) by mouth daily. with food  Dispense: 90 tablet; Refill: 1 - hydrochlorothiazide (HYDRODIURIL) 25 MG tablet; Take 1 tablet (25 mg total) by mouth daily.  Dispense: 90 tablet; Refill: 1 - quinapril (ACCUPRIL) 40 MG tablet; 1 1/2 po daily  Dispense: 135 tablet; Refill: 1 - CBC with Differential/Platelet - CMP14+EGFR  2. Chronic atrial fibrillation (HCC) Avoid caffeine - apixaban (ELIQUIS) 2.5 MG TABS tablet; Take 1 tablet (2.5 mg total) by mouth 2 (two) times daily.  Dispense: 180 tablet; Refill:  1  3. PVD (peripheral vascular disease) (HCC) - cilostazol (PLETAL) 100 MG tablet; Take 1 tablet (100 mg total) by mouth 2 (two) times daily.  Dispense: 180 tablet; Refill: 1  4. Mixed hyperlipidemia Low fat diet - simvastatin (ZOCOR) 20 MG tablet; Take 1  tablet (20 mg total) by mouth daily.  Dispense: 90 tablet; Refill: 1 - Lipid panel  5. Gastroesophageal reflux disease without esophagitis Avoid spicy foods Do not eat 2 hours prior to bedtime  6. Stage 3b chronic kidney disease ([place]) Labs pending  7. Vitamin D deficiency Continue daily vitamin d supplement  8. BMI 27.0-27.9,adult Discussed diet and exercise for person with BMI >25 Will recheck weight in 3-6 months    Labs pending Health Maintenance reviewed Diet and exercise encouraged  Follow up plan: 6 months   Mary-Margaret Hassell Done, FNP

## 2021-06-18 LAB — CBC WITH DIFFERENTIAL/PLATELET
Basophils Absolute: 0.1 10*3/uL (ref 0.0–0.2)
Basos: 1 %
EOS (ABSOLUTE): 0.2 10*3/uL (ref 0.0–0.4)
Eos: 3 %
Hematocrit: 42.3 % (ref 37.5–51.0)
Hemoglobin: 14.2 g/dL (ref 13.0–17.7)
Immature Grans (Abs): 0 10*3/uL (ref 0.0–0.1)
Immature Granulocytes: 0 %
Lymphocytes Absolute: 1.8 10*3/uL (ref 0.7–3.1)
Lymphs: 26 %
MCH: 30.1 pg (ref 26.6–33.0)
MCHC: 33.6 g/dL (ref 31.5–35.7)
MCV: 90 fL (ref 79–97)
Monocytes Absolute: 0.5 10*3/uL (ref 0.1–0.9)
Monocytes: 8 %
Neutrophils Absolute: 4.4 10*3/uL (ref 1.4–7.0)
Neutrophils: 62 %
Platelets: 230 10*3/uL (ref 150–450)
RBC: 4.72 x10E6/uL (ref 4.14–5.80)
RDW: 13.4 % (ref 11.6–15.4)
WBC: 7.1 10*3/uL (ref 3.4–10.8)

## 2021-06-18 LAB — CMP14+EGFR
ALT: 18 IU/L (ref 0–44)
AST: 28 IU/L (ref 0–40)
Albumin/Globulin Ratio: 1.6 (ref 1.2–2.2)
Albumin: 4.4 g/dL (ref 3.6–4.6)
Alkaline Phosphatase: 40 IU/L — ABNORMAL LOW (ref 44–121)
BUN/Creatinine Ratio: 15 (ref 10–24)
BUN: 24 mg/dL (ref 8–27)
Bilirubin Total: 0.4 mg/dL (ref 0.0–1.2)
CO2: 23 mmol/L (ref 20–29)
Calcium: 9.9 mg/dL (ref 8.6–10.2)
Chloride: 101 mmol/L (ref 96–106)
Creatinine, Ser: 1.55 mg/dL — ABNORMAL HIGH (ref 0.76–1.27)
Globulin, Total: 2.7 g/dL (ref 1.5–4.5)
Glucose: 106 mg/dL — ABNORMAL HIGH (ref 70–99)
Potassium: 4 mmol/L (ref 3.5–5.2)
Sodium: 140 mmol/L (ref 134–144)
Total Protein: 7.1 g/dL (ref 6.0–8.5)
eGFR: 43 mL/min/{1.73_m2} — ABNORMAL LOW (ref >59)

## 2021-06-18 LAB — LIPID PANEL
Chol/HDL Ratio: 2.4 ratio (ref 0.0–5.0)
Cholesterol, Total: 125 mg/dL (ref 100–199)
HDL: 53 mg/dL (ref >39)
LDL Chol Calc (NIH): 53 mg/dL (ref 0–99)
Triglycerides: 103 mg/dL (ref 0–149)
VLDL Cholesterol Cal: 19 mg/dL (ref 5–40)

## 2021-07-18 DIAGNOSIS — H401131 Primary open-angle glaucoma, bilateral, mild stage: Secondary | ICD-10-CM | POA: Diagnosis not present

## 2021-08-12 ENCOUNTER — Ambulatory Visit (INDEPENDENT_AMBULATORY_CARE_PROVIDER_SITE_OTHER): Payer: Medicare Other

## 2021-08-12 VITALS — Wt 185.0 lb

## 2021-08-12 DIAGNOSIS — Z0000 Encounter for general adult medical examination without abnormal findings: Secondary | ICD-10-CM | POA: Diagnosis not present

## 2021-08-12 NOTE — Patient Instructions (Signed)
Frank Cook , Thank you for taking time to come for your Medicare Wellness Visit. I appreciate your ongoing commitment to your health goals. Please review the following plan we discussed and let me know if I can assist you in the future.   Screening recommendations/referrals: Colonoscopy: no longer required Recommended yearly ophthalmology/optometry visit for glaucoma screening and checkup Recommended yearly dental visit for hygiene and checkup  Vaccinations: Influenza vaccine: Done 04/05/21 - Repeat annually Pneumococcal vaccine: Done 2001 & 09/17/2014 - ask about Prevnar-20 Tdap vaccine: Done 06/17/2021 -Repeat in 10 years Shingles vaccine: Done 08/12/2018 & 11/12/2018   Covid-19: Done 07/01/19, 07/29/19, 03/31/20, 10/26/20, 04/05/21  Advanced directives: in chart  Conditions/risks identified: Aim for 30 minutes of chair exercises and/or walking, 6-8 glasses of water, and 5 servings of fruits and vegetables each day.   Next appointment: Follow up in one year for your annual wellness visit.   Preventive Care 16 Years and Older, Male  Preventive care refers to lifestyle choices and visits with your health care provider that can promote health and wellness. What does preventive care include? A yearly physical exam. This is also called an annual well check. Dental exams once or twice a year. Routine eye exams. Ask your health care provider how often you should have your eyes checked. Personal lifestyle choices, including: Daily care of your teeth and gums. Regular physical activity. Eating a healthy diet. Avoiding tobacco and drug use. Limiting alcohol use. Practicing safe sex. Taking low doses of aspirin every day. Taking vitamin and mineral supplements as recommended by your health care provider. What happens during an annual well check? The services and screenings done by your health care provider during your annual well check will depend on your age, overall health, lifestyle risk  factors, and family history of disease. Counseling  Your health care provider may ask you questions about your: Alcohol use. Tobacco use. Drug use. Emotional well-being. Home and relationship well-being. Sexual activity. Eating habits. History of falls. Memory and ability to understand (cognition). Work and work Astronomer. Screening  You may have the following tests or measurements: Height, weight, and BMI. Blood pressure. Lipid and cholesterol levels. These may be checked every 5 years, or more frequently if you are over 21 years old. Skin check. Lung cancer screening. You may have this screening every year starting at age 4 if you have a 30-pack-year history of smoking and currently smoke or have quit within the past 15 years. Fecal occult blood test (FOBT) of the stool. You may have this test every year starting at age 68. Flexible sigmoidoscopy or colonoscopy. You may have a sigmoidoscopy every 5 years or a colonoscopy every 10 years starting at age 1. Prostate cancer screening. Recommendations will vary depending on your family history and other risks. Hepatitis C blood test. Hepatitis B blood test. Sexually transmitted disease (STD) testing. Diabetes screening. This is done by checking your blood sugar (glucose) after you have not eaten for a while (fasting). You may have this done every 1-3 years. Abdominal aortic aneurysm (AAA) screening. You may need this if you are a current or former smoker. Osteoporosis. You may be screened starting at age 62 if you are at high risk. Talk with your health care provider about your test results, treatment options, and if necessary, the need for more tests. Vaccines  Your health care provider may recommend certain vaccines, such as: Influenza vaccine. This is recommended every year. Tetanus, diphtheria, and acellular pertussis (Tdap, Td) vaccine. You may need  a Td booster every 10 years. Zoster vaccine. You may need this after age  65. Pneumococcal 13-valent conjugate (PCV13) vaccine. One dose is recommended after age 30. Pneumococcal polysaccharide (PPSV23) vaccine. One dose is recommended after age 50. Talk to your health care provider about which screenings and vaccines you need and how often you need them. This information is not intended to replace advice given to you by your health care provider. Make sure you discuss any questions you have with your health care provider. Document Released: 06/18/2015 Document Revised: 02/09/2016 Document Reviewed: 03/23/2015 Elsevier Interactive Patient Education  2017 [employer] Prevention in the Home Falls can cause injuries. They can happen to people of all ages. There are many things you can do to make your home safe and to help prevent falls. What can I do on the outside of my home? Regularly fix the edges of walkways and driveways and fix any cracks. Remove anything that might make you trip as you walk through a door, such as a raised step or threshold. Trim any bushes or trees on the path to your home. Use bright outdoor lighting. Clear any walking paths of anything that might make someone trip, such as rocks or tools. Regularly check to see if handrails are loose or broken. Make sure that both sides of any steps have handrails. Any raised decks and porches should have guardrails on the edges. Have any leaves, snow, or ice cleared regularly. Use sand or salt on walking paths during winter. Clean up any spills in your garage right away. This includes oil or grease spills. What can I do in the bathroom? Use night lights. Install grab bars by the toilet and in the tub and shower. Do not use towel bars as grab bars. Use non-skid mats or decals in the tub or shower. If you need to sit down in the shower, use a plastic, non-slip stool. Keep the floor dry. Clean up any water that spills on the floor as soon as it happens. Remove soap buildup in the tub or shower  regularly. Attach bath mats securely with double-sided non-slip rug tape. Do not have throw rugs and other things on the floor that can make you trip. What can I do in the bedroom? Use night lights. Make sure that you have a light by your bed that is easy to reach. Do not use any sheets or blankets that are too big for your bed. They should not hang down onto the floor. Have a firm chair that has side arms. You can use this for support while you get dressed. Do not have throw rugs and other things on the floor that can make you trip. What can I do in the kitchen? Clean up any spills right away. Avoid walking on wet floors. Keep items that you use a lot in easy-to-reach places. If you need to reach something above you, use a strong step stool that has a grab bar. Keep electrical cords out of the way. Do not use floor polish or wax that makes floors slippery. If you must use wax, use non-skid floor wax. Do not have throw rugs and other things on the floor that can make you trip. What can I do with my stairs? Do not leave any items on the stairs. Make sure that there are handrails on both sides of the stairs and use them. Fix handrails that are broken or loose. Make sure that handrails are as long as the stairways. Check  any carpeting to make sure that it is firmly attached to the stairs. Fix any carpet that is loose or worn. Avoid having throw rugs at the top or bottom of the stairs. If you do have throw rugs, attach them to the floor with carpet tape. Make sure that you have a light switch at the top of the stairs and the bottom of the stairs. If you do not have them, ask someone to add them for you. What else can I do to help prevent falls? Wear shoes that: Do not have high heels. Have rubber bottoms. Are comfortable and fit you well. Are closed at the toe. Do not wear sandals. If you use a stepladder: Make sure that it is fully opened. Do not climb a closed stepladder. Make sure that  both sides of the stepladder are locked into place. Ask someone to hold it for you, if possible. Clearly mark and make sure that you can see: Any grab bars or handrails. First and last steps. Where the edge of each step is. Use tools that help you move around (mobility aids) if they are needed. These include: Canes. Walkers. Scooters. Crutches. Turn on the lights when you go into a dark area. Replace any light bulbs as soon as they burn out. Set up your furniture so you have a clear path. Avoid moving your furniture around. If any of your floors are uneven, fix them. If there are any pets around you, be aware of where they are. Review your medicines with your doctor. Some medicines can make you feel dizzy. This can increase your chance of falling. Ask your doctor what other things that you can do to help prevent falls. This information is not intended to replace advice given to you by your health care provider. Make sure you discuss any questions you have with your health care provider. Document Released: 03/18/2009 Document Revised: 10/28/2015 Document Reviewed: 06/26/2014 Elsevier Interactive Patient Education  2017 [employer].

## 2021-08-12 NOTE — Progress Notes (Signed)
Subjective:   Frank Cook is a 88 y.o. male who presents for Medicare Annual/Subsequent preventive examination.  Virtual Visit via Telephone Note  I connected with  Frank Cook on 08/12/21 at 10:30 AM EST by telephone and verified that I am speaking with the correct person using two identifiers.  Location: Patient: Home Provider: [facility] Persons participating in the virtual visit: patient/Nurse Health Advisor   I discussed the limitations, risks, security and privacy concerns of performing an evaluation and management service by telephone and the availability of in person appointments. The patient expressed understanding and agreed to proceed.  Interactive audio and video telecommunications were attempted between this nurse and patient, however failed, due to patient having technical difficulties OR patient did not have access to video capability.  We continued and completed visit with audio only.  Some vital signs may be absent or patient reported.   Caylor Cerino E Ariana Juul, LPN   Review of Systems     Cardiac Risk Factors include: advanced age (>37men, >26 women);sedentary lifestyle;male gender;dyslipidemia;hypertension;Other (see comment), Risk factor comments: PVD, A.Fib     Objective:    Today's Vitals   08/12/21 1032  Weight: 185 lb (83.9 kg)  PainSc: 4    Body mass index is 26.54 kg/m.  Advanced Directives 08/12/2021 08/11/2020 08/04/2019 07/31/2018 07/26/2017 07/19/2016  Does Patient Have a Medical Advance Directive? Yes Yes Yes Yes Yes Yes  Type of Paramedic of [place];Living will Living will;Healthcare Power of [place];Living will Living will [place];Living will [place];Living will  Does patient want to make changes to medical advance directive? - No - Patient declined - No - Patient declined - No - Patient declined  Copy of [place] in Chart? Yes - validated most  recent copy scanned in chart (See row information) No - copy requested Yes - validated most recent copy scanned in chart (See row information) - Yes Yes    Current Medications (verified) Outpatient Encounter Medications as of 08/12/2021  Medication Sig   apixaban (ELIQUIS) 2.5 MG TABS tablet Take 1 tablet (2.5 mg total) by mouth 2 (two) times daily.   Cholecalciferol (VITAMIN D3) 5000 units CAPS Take 5,000 Units by mouth daily.   cilostazol (PLETAL) 100 MG tablet Take 1 tablet (100 mg total) by mouth 2 (two) times daily.   Glucosamine 500 MG CAPS Take 1,500 mg by mouth daily.   hydrochlorothiazide (HYDRODIURIL) 25 MG tablet Take 1 tablet (25 mg total) by mouth daily.   latanoprost (XALATAN) 0.005 % ophthalmic solution Place 1 drop into both eyes at bedtime.   metoprolol succinate (TOPROL-XL) 100 MG 24 hr tablet Take 1 tablet (100 mg total) by mouth daily. with food   Omega 3 1000 MG CAPS Take 2,000 mg by mouth daily.   omeprazole (PRILOSEC) 20 MG capsule TAKE (1) CAPSULE DAILY   quinapril (ACCUPRIL) 40 MG tablet 1 1/2 po daily   simvastatin (ZOCOR) 20 MG tablet Take 1 tablet (20 mg total) by mouth daily.   No facility-administered encounter medications on file as of 08/12/2021.    Allergies (verified) Patient has no known allergies.   History: Past Medical History:  Diagnosis Date   Atrial fibrillation (HCC)    Colon polyps    GERD (gastroesophageal reflux disease)    Hyperlipidemia    Hypertension    Osteopenia    Past Surgical History:  Procedure Laterality Date   CATARACT EXTRACTION Bilateral  COLONOSCOPY     None     Family History  Problem Relation Age of Onset   COPD Mother    Cancer Father        Bone   Cancer Brother        Bone   Stroke Maternal Grandfather    Arthritis Brother    Hypertension Brother    Social History   Socioeconomic History   Marital status: Married    Spouse name: Not on file   Number of children: 0   Years of education: Not on  file   Highest education level: High school graduate  Occupational History   Occupation: Retired    Fish farm manager: [employer]  Tobacco Use   Smoking status: Never   Smokeless tobacco: Never   Tobacco comments:    Never smoker   Vaping Use   Vaping Use: Never used  Substance and Sexual Activity   Alcohol use: No   Drug use: No   Sexual activity: Not on file  Other Topics Concern   Not on file  Social History Narrative   Lives alone.  Wife is now in nursing home.   His niece and nephew check on him frequently; he has a close friend that helps him around the house   Niece drives him out of town and helps with shopping   Social Determinants of Health   Financial Resource Strain: Low Risk    Difficulty of Paying Living Expenses: Not hard at all  Food Insecurity: No Food Insecurity   Worried About Charity fundraiser in the Last Year: Never true   Arboriculturist in the Last Year: Never true  Transportation Needs: No Transportation Needs   Lack of Transportation (Medical): No   Lack of Transportation (Non-Medical): No  Physical Activity: Insufficiently Active   Days of Exercise per Week: 7 days   Minutes of Exercise per Session: 10 min  Stress: No Stress Concern Present   Feeling of Stress : Not at all  Social Connections: Moderately Isolated   Frequency of Communication with Friends and Family: More than three times a week   Frequency of Social Gatherings with Friends and Family: More than three times a week   Attends Religious Services: Never   Marine scientist or Organizations: No   Attends Music therapist: Never   Marital Status: Married    Tobacco Counseling Counseling given: Not Answered Tobacco comments: Never smoker    Clinical Intake:  Pre-visit preparation completed: Yes  Pain : 0-10 Pain Score: 4  Pain Type: Chronic pain Pain Location: Generalized Pain Descriptors / Indicators: Aching, Discomfort Pain Onset: More than a month ago Pain  Frequency: Intermittent     BMI - recorded: 26.54 Nutritional Status: BMI 25 -29 Overweight Nutritional Risks: Nausea/ vomitting/ diarrhea (one day - thinks he at something bad) Diabetes: No  How often do you need to have someone help you when you read instructions, pamphlets, or other written materials from your doctor or pharmacy?: 1 - Never  Diabetic? no  Interpreter Needed?: No  Information entered by :: Suliman Termini, LPN   Activities of Daily Living In your present state of health, do you have any difficulty performing the following activities: 08/12/2021  Hearing? N  Vision? N  Difficulty concentrating or making decisions? N  Walking or climbing stairs? Y  Dressing or bathing? N  Doing errands, shopping? Y  Comment he only drives short distances; niece drives him out of town  Preparing Food and eating ? N  Using the Toilet? N  In the past six months, have you accidently leaked urine? N  Do you have problems with loss of bowel control? N  Managing your Medications? N  Managing your Finances? N  Housekeeping or managing your Housekeeping? N  Some recent data might be hidden    Patient Care Team: Chevis Pretty, FNP as PCP - General (Nurse Practitioner) Minus Breeding, MD as Consulting Physician (Cardiology) Ilean China, RN as Case Manager Otelia Sergeant, OD as Referring Physician (Optometry)  Indicate any recent Medical Services you may have received from other than [facility] providers in the past year (date may be approximate).     Assessment:   This is a routine wellness examination for Frank Cook.  Hearing/Vision screen Hearing Screening - Comments:: Denies hearing difficulties   Vision Screening - Comments:: Wears rx glasses - up to date with routine eye exams with [facility] in [place] issues and exercise activities discussed: Current Exercise Habits: Home exercise routine, Type of exercise: walking, Time (Minutes): 10, Frequency  (Times/Week): 7, Weekly Exercise (Minutes/Week): 70, Intensity: Mild, Exercise limited by: orthopedic condition(s)   Goals Addressed             This Visit's Progress    awv   Not on track    08/04/2019 AWV Goal: Exercise for General Health  Patient will verbalize understanding of the benefits of increased physical activity: Exercising regularly is important. It will improve your overall fitness, flexibility, and endurance. Regular exercise also will improve your overall health. It can help you control your weight, reduce stress, and improve your bone density. Over the next year, patient will increase physical activity as tolerated with a goal of at least 150 minutes of moderate physical activity per week.  You can tell that you are exercising at a moderate intensity if your heart starts beating faster and you start breathing faster but can still hold a conversation. Moderate-intensity exercise ideas include: Walking 1 mile (1.6 km) in about 15 minutes Biking Hiking Golfing Dancing Water aerobics Patient will verbalize understanding of everyday activities that increase physical activity by providing examples like the following: Yard work, such as: Sales promotion account executive Gardening Washing windows or floors Patient will be able to explain general safety guidelines for exercising:  Before you start a new exercise program, talk with your health care provider. Do not exercise so much that you hurt yourself, feel dizzy, or get very short of breath. Wear comfortable clothes and wear shoes with good support. Drink plenty of water while you exercise to prevent dehydration or heat stroke. Work out until your breathing and your heartbeat get faster.      Patient Stated   On track    08/11/2020 AWV Goal: Fall Prevention  Over the next year, patient will decrease their risk for falls by: Using assistive devices,  such as a cane or walker, as needed Identifying fall risks within their home and correcting them by: Removing throw rugs Adding handrails to stairs or ramps Removing clutter and keeping a clear pathway throughout the home Increasing light, especially at night Adding shower handles/bars Raising toilet seat Identifying potential personal risk factors for falls: Medication side effects Incontinence/urgency Vestibular dysfunction Hearing loss Musculoskeletal disorders Neurological disorders Orthostatic hypotension         Depression Screen [facility] 2/9 Scores 08/12/2021 06/17/2021 12/14/2020 09/13/2020 08/11/2020 06/17/2020 03/05/2020  PHQ - 2 Score 1 0 1 0 0 0 0  PHQ- 9 Score - - 3 - - - -    Fall Risk Fall Risk  08/12/2021 06/17/2021 12/14/2020 09/13/2020 08/11/2020  Falls in the past year? 1 1 0 0 0  Number falls in past yr: 0 0 - - -  Injury with Fall? 0 0 - - -  Comment - - - - -  Risk for fall due to : History of fall(s);Impaired balance/gait;Mental status change History of fall(s) - - -  Risk for fall due to: Comment - - - - -  Follow up Falls prevention discussed Education provided - - Falls evaluation completed    [place]:  Any stairs in or around the home? Yes  If so, are there any without handrails? No  Home free of loose throw rugs in walkways, pet beds, electrical cords, etc? Yes  Adequate lighting in your home to reduce risk of falls? Yes   ASSISTIVE DEVICES UTILIZED TO PREVENT FALLS:  Life alert? Yes  Use of a cane, walker or w/c? Yes  Grab bars in the bathroom? Yes  Shower chair or bench in shower? Yes  Elevated toilet seat or a handicapped toilet? Yes   TIMED UP AND GO:  Was the test performed? No . Telephonic visit  Cognitive Function: MMSE - Mini Mental State Exam 07/31/2018 07/26/2017 07/19/2016  Orientation to time 5 5 5   Orientation to Place 5 5 5   Registration 3 3 3   Attention/ Calculation 5 5 4   Recall 2 3 1   Language-  name 2 objects 2 2 2   Language- repeat 1 1 1   Language- follow 3 step command 3 3 3   Language- read & follow direction 1 1 1   Write a sentence 1 1 1   Copy design 1 1 1   Total score 29 30 27      6CIT Screen 08/12/2021 08/11/2020 08/04/2019  What Year? 0 points 0 points 0 points  What month? 0 points 0 points 0 points  What time? 0 points 0 points 0 points  Count back from 20 0 points 0 points 0 points  Months in reverse 0 points 0 points 0 points  Repeat phrase 2 points 6 points 2 points  Total Score 2 6 2     Immunizations Immunization History  Administered Date(s) Administered   Fluad Quad(high Dose 65+) 03/05/2020, 04/05/2021   Influenza, High Dose Seasonal PF 04/03/2016, 04/02/2017   Influenza,inj,Quad PF,6+ Mos 02/24/2013, 03/16/2014, 03/29/2015, 03/18/2019   Influenza,inj,quad, With Preservative 03/18/2019   Influenza-Unspecified 03/13/2018, 03/04/2021   Moderna Covid-19 Vaccine Bivalent Booster 48yrs & up 04/05/2021   Moderna SARS-COV2 Booster Vaccination 03/31/2020, 10/26/2020   Moderna Sars-Covid-2 Vaccination 07/01/2019, 07/29/2019   Pneumococcal Conjugate-13 09/17/2014   Pneumococcal Polysaccharide-23 06/06/1999   Tdap 03/06/2011, 06/17/2021   Zoster Recombinat (Shingrix) 08/12/2018, 11/12/2018   Zoster, Live 04/06/2007    TDAP status: Up to date  Flu Vaccine status: Up to date  Pneumococcal vaccine status: Up to date  Covid-19 vaccine status: Completed vaccines  Qualifies for Shingles Vaccine? Yes   Zostavax completed Yes   Shingrix Completed?: Yes  Screening Tests Health Maintenance  Topic Date Due   COVID-19 Vaccine (3 - Moderna risk series) 04/05/2021   TETANUS/TDAP  06/18/2031   Pneumonia Vaccine 4+ Years old  Completed   INFLUENZA VACCINE  Completed   Zoster Vaccines- Shingrix  Completed   HPV VACCINES  Aged Out    Health Maintenance  Health Maintenance Due  Topic Date Due   COVID-19 Vaccine (3 - Moderna risk series) 04/05/2021     Colorectal cancer screening: No longer required.   Lung Cancer Screening: (Low Dose CT Chest recommended if Age 38-80 years, 30 pack-year currently smoking OR have quit w/in 15years.) does not qualify.  Additional Screening:  Hepatitis C Screening: does not qualify  Vision Screening: Recommended annual ophthalmology exams for early detection of glaucoma and other disorders of the eye. Is the patient up to date with their annual eye exam?  Yes  Who is the provider or what is the name of the office in which the patient attends annual eye exams? [facility] in [place] If pt is not established with a provider, would they like to be referred to a provider to establish care? No .   Dental Screening: Recommended annual dental exams for proper oral hygiene  Community Resource Referral / Chronic Care Management: CRR required this visit?  No   CCM required this visit?  No      Plan:     I have personally reviewed and noted the following in the patients chart:   Medical and social history Use of alcohol, tobacco or illicit drugs  Current medications and supplements including opioid prescriptions. Patient is not currently taking opioid prescriptions. Functional ability and status Nutritional status Physical activity Advanced directives List of other physicians Hospitalizations, surgeries, and ER visits in previous 12 months Vitals Screenings to include cognitive, depression, and falls Referrals and appointments  In addition, I have reviewed and discussed with patient certain preventive protocols, quality metrics, and best practice recommendations. A written personalized care plan for preventive services as well as general preventive health recommendations were provided to patient.     Sandrea Hammond, LPN   [id number]   Nurse Notes: None

## 2021-09-05 ENCOUNTER — Other Ambulatory Visit: Payer: Self-pay | Admitting: Nurse Practitioner

## 2021-09-05 DIAGNOSIS — I10 Essential (primary) hypertension: Secondary | ICD-10-CM

## 2021-09-06 ENCOUNTER — Telehealth: Payer: Self-pay | Admitting: Nurse Practitioner

## 2021-09-06 MED ORDER — LISINOPRIL 40 MG PO TABS: 40.0000 mg | ORAL_TABLET | Freq: Every day | ORAL | 1 refills | Status: DC

## 2021-09-06 NOTE — Telephone Encounter (Signed)
Stew called from [facility] to let [facility]MM know that they are unable to fill patients Quinapril 40mg  Rx due to it being on back order due to recall. ?

## 2021-09-10 ENCOUNTER — Other Ambulatory Visit: Payer: Self-pay | Admitting: Nurse Practitioner

## 2021-09-10 DIAGNOSIS — K219 Gastro-esophageal reflux disease without esophagitis: Secondary | ICD-10-CM

## 2021-12-10 ENCOUNTER — Other Ambulatory Visit: Payer: Self-pay | Admitting: Nurse Practitioner

## 2021-12-10 DIAGNOSIS — K219 Gastro-esophageal reflux disease without esophagitis: Secondary | ICD-10-CM

## 2021-12-13 ENCOUNTER — Ambulatory Visit (INDEPENDENT_AMBULATORY_CARE_PROVIDER_SITE_OTHER): Payer: Medicare Other | Admitting: Nurse Practitioner

## 2021-12-13 ENCOUNTER — Encounter: Payer: Self-pay | Admitting: Nurse Practitioner

## 2021-12-13 VITALS — BP 125/73 | HR 98 | Temp 97.3°F | Resp 20 | Ht 70.0 in | Wt 180.0 lb

## 2021-12-13 DIAGNOSIS — I739 Peripheral vascular disease, unspecified: Secondary | ICD-10-CM

## 2021-12-13 DIAGNOSIS — Z6827 Body mass index (BMI) 27.0-27.9, adult: Secondary | ICD-10-CM

## 2021-12-13 DIAGNOSIS — I482 Chronic atrial fibrillation, unspecified: Secondary | ICD-10-CM | POA: Diagnosis not present

## 2021-12-13 DIAGNOSIS — E782 Mixed hyperlipidemia: Secondary | ICD-10-CM

## 2021-12-13 DIAGNOSIS — I10 Essential (primary) hypertension: Secondary | ICD-10-CM | POA: Diagnosis not present

## 2021-12-13 DIAGNOSIS — K219 Gastro-esophageal reflux disease without esophagitis: Secondary | ICD-10-CM

## 2021-12-13 DIAGNOSIS — N1832 Chronic kidney disease, stage 3b: Secondary | ICD-10-CM | POA: Diagnosis not present

## 2021-12-13 DIAGNOSIS — E559 Vitamin D deficiency, unspecified: Secondary | ICD-10-CM

## 2021-12-13 MED ORDER — OMEPRAZOLE 20 MG PO CPDR: 20.0000 mg | DELAYED_RELEASE_CAPSULE | Freq: Every day | ORAL | 1 refills | Status: DC

## 2021-12-13 MED ORDER — METOPROLOL SUCCINATE ER 100 MG PO TB24: 100.0000 mg | ORAL_TABLET | Freq: Every day | ORAL | 1 refills | Status: DC

## 2021-12-13 MED ORDER — LISINOPRIL 40 MG PO TABS: 40.0000 mg | ORAL_TABLET | Freq: Every day | ORAL | 1 refills | Status: DC

## 2021-12-13 MED ORDER — HYDROCHLOROTHIAZIDE 25 MG PO TABS: 25.0000 mg | ORAL_TABLET | Freq: Every day | ORAL | 1 refills | Status: DC

## 2021-12-13 MED ORDER — CILOSTAZOL 100 MG PO TABS: 100.0000 mg | ORAL_TABLET | Freq: Two times a day (BID) | ORAL | 1 refills | Status: DC

## 2021-12-13 MED ORDER — SIMVASTATIN 20 MG PO TABS: 20.0000 mg | ORAL_TABLET | Freq: Every day | ORAL | 1 refills | Status: DC

## 2021-12-13 MED ORDER — APIXABAN 2.5 MG PO TABS: ORAL_TABLET | ORAL | 1 refills | Status: DC

## 2021-12-13 NOTE — Progress Notes (Signed)
Subjective:    Patient ID: Frank Cook, male    DOB: 1931/09/26, 88 y.o.   MRN: [id number]   Chief Complaint: Medical Management of Chronic Issues    HPI:  Frank Cook is a 88 y.o. who identifies as a male who was assigned male at birth.   Social history: Lives with: by hisself- mieces check on him daily- HIs wife is in retirement facility Work history: retired   Water engineer in today for follow up of the following chronic medical issues:  1. Primary hypertension No c/o chest pain, sob or headaches. Does not check blood pressure at home. BP Readings from Last 3 Encounters:  12/13/21 125/73  06/17/21 132/72  01/12/21 110/70     2. Mixed hyperlipidemia Eats whatever he can fix. Some meals are prepared for him and he just heats them up. Lab Results  Component Value Date   CHOL 125 06/17/2021   HDL 53 06/17/2021   LDLCALC 53 06/17/2021   TRIG 103 06/17/2021   CHOLHDL 2.4 06/17/2021   The ASCVD Risk score (Arnett DK, et al., 2019) failed to calculate for the following reasons:   The 2019 ASCVD risk score is only valid for ages 68 to 70   3. PVD (peripheral vascular disease) (HCC) No issues that bother him  4. Chronic atrial fibrillation (HCC) No palpitations or heart racing. He is still on eliquis with no bleeding issues  5. Gastroesophageal reflux disease without esophagitis Is on omeprazole daily but still has occasional symptoms.  6. Stage 3b chronic kidney disease (HCC) No voiding issues Lab Results  Component Value Date   CREATININE 1.55 (H) 06/17/2021     7. Vitamin D deficiency Is on daily vitamin d supplement  8. BMI 27.0-27.9,adult Weight is down 5lbs Wt Readings from Last 3 Encounters:  12/13/21 180 lb (81.6 kg)  08/12/21 185 lb (83.9 kg)  06/17/21 185 lb (83.9 kg)   BMI Readings from Last 3 Encounters:  12/13/21 25.83 kg/m  08/12/21 26.54 kg/m  06/17/21 26.54 kg/m      New complaints: None today  No Known Allergies Outpatient  Encounter Medications as of 12/13/2021  Medication Sig   apixaban (ELIQUIS) 2.5 MG TABS tablet Take 1 tablet (2.5 mg total) by mouth 2 (two) times daily.   Cholecalciferol (VITAMIN D3) 5000 units CAPS Take 5,000 Units by mouth daily.   cilostazol (PLETAL) 100 MG tablet Take 1 tablet (100 mg total) by mouth 2 (two) times daily.   hydrochlorothiazide (HYDRODIURIL) 25 MG tablet Take 1 tablet (25 mg total) by mouth daily.   latanoprost (XALATAN) 0.005 % ophthalmic solution Place 1 drop into both eyes at bedtime.   lisinopril (ZESTRIL) 40 MG tablet Take 1 tablet (40 mg total) by mouth daily.   metoprolol succinate (TOPROL-XL) 100 MG 24 hr tablet Take 1 tablet (100 mg total) by mouth daily. with food   Omega 3 1000 MG CAPS Take 2,000 mg by mouth daily.   omeprazole (PRILOSEC) 20 MG capsule TAKE 1 CAPSULE DAILY   simvastatin (ZOCOR) 20 MG tablet Take 1 tablet (20 mg total) by mouth daily.   TURMERIC PO Take by mouth.   [DISCONTINUED] Glucosamine 500 MG CAPS Take 1,500 mg by mouth daily.   No facility-administered encounter medications on file as of 12/13/2021.    Past Surgical History:  Procedure Laterality Date   CATARACT EXTRACTION Bilateral    COLONOSCOPY     None      Family History  Problem Relation Age  of Onset   COPD Mother    Cancer Father        Bone   Cancer Brother        Bone   Stroke Maternal Grandfather    Arthritis Brother    Hypertension Brother       Controlled substance contract: n/a     Review of Systems  Constitutional:  Negative for diaphoresis.  Eyes:  Negative for pain.  Respiratory:  Negative for shortness of breath.   Cardiovascular:  Negative for chest pain, palpitations and leg swelling.  Gastrointestinal:  Negative for abdominal pain.  Endocrine: Negative for polydipsia.  Skin:  Negative for rash.  Neurological:  Negative for dizziness, weakness and headaches.  Hematological:  Does not bruise/bleed easily.  All other systems reviewed and are  negative.      Objective:   Physical Exam Vitals and nursing note reviewed.  Constitutional:      Appearance: Normal appearance. He is well-developed.  HENT:     Head: Normocephalic.     Nose: Nose normal.     Mouth/Throat:     Mouth: Mucous membranes are moist.     Pharynx: Oropharynx is clear.  Eyes:     Pupils: Pupils are equal, round, and reactive to light.  Neck:     Thyroid: No thyroid mass or thyromegaly.     Vascular: No carotid bruit or JVD.     Trachea: Phonation normal.  Cardiovascular:     Rate and Rhythm: Normal rate and regular rhythm.  Pulmonary:     Effort: Pulmonary effort is normal. No respiratory distress.     Breath sounds: Normal breath sounds.  Abdominal:     General: Bowel sounds are normal.     Palpations: Abdomen is soft.     Tenderness: There is no abdominal tenderness.  Musculoskeletal:        General: Normal range of motion.     Cervical back: Normal range of motion and neck supple.     Comments: Ambulation slow and steady with cane  Lymphadenopathy:     Cervical: No cervical adenopathy.  Skin:    General: Skin is warm and dry.  Neurological:     Mental Status: He is alert and oriented to person, place, and time.  Psychiatric:        Behavior: Behavior normal.        Thought Content: Thought content normal.        Judgment: Judgment normal.   BP 125/73   Pulse 98   Temp (!) 97.3 F (36.3 C) (Temporal)   Resp 20   Ht 5\' 10"  (1.778 m)   Wt 180 lb (81.6 kg)   SpO2 98%   BMI 25.83 kg/m          Assessment & Plan:   Frank Cook comes in today with chief complaint of Medical Management of Chronic Issues   Diagnosis and orders addressed:  1. Primary hypertension Low sodium diet - hydrochlorothiazide (HYDRODIURIL) 25 MG tablet; Take 1 tablet (25 mg total) by mouth daily.  Dispense: 90 tablet; Refill: 1 - metoprolol succinate (TOPROL-XL) 100 MG 24 hr tablet; Take 1 tablet (100 mg total) by mouth daily. with food  Dispense: 90  tablet; Refill: 1 - lisinopril (ZESTRIL) 40 MG tablet; Take 1 tablet (40 mg total) by mouth daily.  Dispense: 90 tablet; Refill: 1  2. Mixed hyperlipidemia Low fat diet - simvastatin (ZOCOR) 20 MG tablet; Take 1 tablet (20 mg total) by mouth  daily.  Dispense: 90 tablet; Refill: 1  3. PVD (peripheral vascular disease) (HCC) - cilostazol (PLETAL) 100 MG tablet; Take 1 tablet (100 mg total) by mouth 2 (two) times daily.  Dispense: 180 tablet; Refill: 1  4. Chronic atrial fibrillation (HCC) Continue eliquis- report any bleeding issues - apixaban (ELIQUIS) 2.5 MG TABS tablet; Take 1 tablet (2.5 mg total) by mouth 2 (two) times daily.  Dispense: 180 tablet; Refill: 1  5. Gastroesophageal reflux disease without esophagitis Avoid spicy foods Do not eat 2 hours prior to bedtime  - omeprazole (PRILOSEC) 20 MG capsule; Take 1 capsule (20 mg total) by mouth daily.  Dispense: 90 capsule; Refill: 1  6. Stage 3b chronic kidney disease (HCC) Labs pending  7. Vitamin D deficiency Continue daily vitamin d  8. BMI 27.0-27.9,adult Discussed diet and exercise for person with BMI >25 Will recheck weight in 3-6 months    Labs pending Health Maintenance reviewed Diet and exercise encouraged  Follow up plan: 6 months   Mary-Margaret Daphine Deutscher, FNP

## 2021-12-13 NOTE — Patient Instructions (Signed)

## 2021-12-14 ENCOUNTER — Ambulatory Visit: Payer: Medicare Other | Admitting: Nurse Practitioner

## 2021-12-14 LAB — LIPID PANEL
Chol/HDL Ratio: 2.5 ratio (ref 0.0–5.0)
Cholesterol, Total: 135 mg/dL (ref 100–199)
HDL: 53 mg/dL (ref >39)
LDL Chol Calc (NIH): 58 mg/dL (ref 0–99)
Triglycerides: 140 mg/dL (ref 0–149)
VLDL Cholesterol Cal: 24 mg/dL (ref 5–40)

## 2021-12-14 LAB — CBC WITH DIFFERENTIAL/PLATELET
Basophils Absolute: 0 10*3/uL (ref 0.0–0.2)
Basos: 1 %
EOS (ABSOLUTE): 0.2 10*3/uL (ref 0.0–0.4)
Eos: 4 %
Hematocrit: 42 % (ref 37.5–51.0)
Hemoglobin: 13.7 g/dL (ref 13.0–17.7)
Immature Grans (Abs): 0 10*3/uL (ref 0.0–0.1)
Immature Granulocytes: 1 %
Lymphocytes Absolute: 1.4 10*3/uL (ref 0.7–3.1)
Lymphs: 23 %
MCH: 29.7 pg (ref 26.6–33.0)
MCHC: 32.6 g/dL (ref 31.5–35.7)
MCV: 91 fL (ref 79–97)
Monocytes Absolute: 0.5 10*3/uL (ref 0.1–0.9)
Monocytes: 7 %
Neutrophils Absolute: 4 10*3/uL (ref 1.4–7.0)
Neutrophils: 64 %
Platelets: 219 10*3/uL (ref 150–450)
RBC: 4.62 x10E6/uL (ref 4.14–5.80)
RDW: 13.2 % (ref 11.6–15.4)
WBC: 6.2 10*3/uL (ref 3.4–10.8)

## 2021-12-14 LAB — CMP14+EGFR
ALT: 11 IU/L (ref 0–44)
AST: 20 IU/L (ref 0–40)
Albumin/Globulin Ratio: 1.7 (ref 1.2–2.2)
Albumin: 4.5 g/dL (ref 3.7–4.7)
Alkaline Phosphatase: 47 IU/L (ref 44–121)
BUN/Creatinine Ratio: 13 (ref 10–24)
BUN: 21 mg/dL (ref 8–27)
Bilirubin Total: 0.4 mg/dL (ref 0.0–1.2)
CO2: 24 mmol/L (ref 20–29)
Calcium: 10 mg/dL (ref 8.6–10.2)
Chloride: 99 mmol/L (ref 96–106)
Creatinine, Ser: 1.59 mg/dL — ABNORMAL HIGH (ref 0.76–1.27)
Globulin, Total: 2.7 g/dL (ref 1.5–4.5)
Glucose: 114 mg/dL — ABNORMAL HIGH (ref 70–99)
Potassium: 4 mmol/L (ref 3.5–5.2)
Sodium: 140 mmol/L (ref 134–144)
Total Protein: 7.2 g/dL (ref 6.0–8.5)
eGFR: 41 mL/min/{1.73_m2} — ABNORMAL LOW (ref >59)

## 2021-12-16 ENCOUNTER — Ambulatory Visit: Payer: Self-pay | Admitting: *Deleted

## 2021-12-16 NOTE — Patient Instructions (Signed)
[place] [facility]  I have previously worked with you through the Chronic Care Management Program at [facility]. Due to program changes I am removing myself from your care team because you've either met our goals, your conditions are stable and no longer require care management, or we haven't engaged within the past 6 months. If you are currently active with another CCM Team Member, you will remain active with them unless they reach out to you with additional information. If you feel that you need RN Care Management services in the future, please talk with your primary care provider to discuss re-engagement with the RN Care Manager that will be assigned to [facility]. This does not affect your status as a patient at [facility].   Thank you for allowing me to participate in your your healthcare journey.  Chong Sicilian, BSN, RN-BC Embedded Chronic Care Manager [facility] / [place] Management Direct Dial: [phone]

## 2021-12-16 NOTE — Chronic Care Management (AMB) (Signed)
  Chronic Care Management   Note  12/16/2021 Name: Frank Cook MRN: [id number] DOB: 1931/10/31   Patient has either met RN Care Management goals, is stable from [place] Management perspective, or has not recently engaged with the RN Care Manager. I am removing RN Care Manager from Care Team and closing [place]. If patient is currently engaged with another CCM team member I will forward this encounter to inform them of my case closure. Patient may be eligible for re-engagement with RN Care Manager in the future if necessary and can discuss this with their PCP.  Chong Sicilian, BSN, RN-BC Embedded Chronic Care Manager [facility] [place] Management Direct Dial: [phone]

## 2022-01-17 DIAGNOSIS — H401131 Primary open-angle glaucoma, bilateral, mild stage: Secondary | ICD-10-CM | POA: Diagnosis not present

## 2022-01-25 ENCOUNTER — Telehealth: Payer: Self-pay | Admitting: Nurse Practitioner

## 2022-01-25 NOTE — Telephone Encounter (Signed)
Niece Carney Bern is asking to talk to nurse about CCM program for pt. She says that pt got a letter in the mail from [facility] that he was being dropped and to talk to pcp about re-engagement.  Carney Bern wants to know if it was a mistake to schedule pt out for 6 mos. He has upcoming apt in January.  What will Carney Bern have to do to get DNR ppw/process started? She states that she talked to a lawyer and was told to talk to pcp.  Carney Bern aware to MMM is off and will address tomorrow. Please call back after 10:00 tomorrow.

## 2022-01-26 ENCOUNTER — Other Ambulatory Visit: Payer: Self-pay | Admitting: Nurse Practitioner

## 2022-01-26 DIAGNOSIS — I482 Chronic atrial fibrillation, unspecified: Secondary | ICD-10-CM

## 2022-01-26 NOTE — Telephone Encounter (Signed)
Spoke with niece who is concerned about a recent fall and not patient really doesn't want to leave his home or go any where. He complains of his legs being weak but not really wanting to do anything. Made him an appointment at the 3 month mark. Also will fill out DNR paperwork at next appt.

## 2022-01-26 NOTE — Progress Notes (Signed)
ref

## 2022-01-31 ENCOUNTER — Telehealth: Payer: Self-pay

## 2022-01-31 NOTE — Chronic Care Management (AMB) (Signed)
  Chronic Care Management   Note  01/31/2022 Name: [name] MRN: [id number] DOB: 01/21/1932  Frank Cook is a 88 y.o. year old male who is a primary care patient of Chevis Pretty, FNP. I reached out to [place] Charon Stallman by phone today in response to a referral sent by Frank Cook PCP.  Mr. Higashi was given information about Chronic Care Management services today including:  CCM service includes personalized support from designated clinical staff supervised by his physician, including individualized plan of care and coordination with other care providers 24/7 contact phone numbers for assistance for urgent and routine care needs. Service will only be billed when office clinical staff spend 20 minutes or more in a month to coordinate care. Only one practitioner may furnish and bill the service in a calendar month. The patient may stop CCM services at any time (effective at the end of the month) by phone call to the office staff. The patient is responsible for co-pay (up to 20% after annual deductible is met) if co-pay is required by the individual health plan.   Patient agreed to services and verbal consent obtained.   Follow up plan: Telephone appointment with care management team member scheduled for:03/21/2022  Noreene Larsson, [place], [place] [address] Direct Dial: [phone] [email or web address]

## 2022-02-28 DIAGNOSIS — Z23 Encounter for immunization: Secondary | ICD-10-CM | POA: Diagnosis not present

## 2022-03-06 ENCOUNTER — Ambulatory Visit (INDEPENDENT_AMBULATORY_CARE_PROVIDER_SITE_OTHER): Payer: Medicare Other | Admitting: Nurse Practitioner

## 2022-03-06 ENCOUNTER — Encounter: Payer: Self-pay | Admitting: Nurse Practitioner

## 2022-03-06 VITALS — BP 129/78 | HR 71 | Temp 97.8°F | Resp 20 | Ht 70.0 in | Wt 180.0 lb

## 2022-03-06 DIAGNOSIS — I482 Chronic atrial fibrillation, unspecified: Secondary | ICD-10-CM | POA: Diagnosis not present

## 2022-03-06 DIAGNOSIS — Z6827 Body mass index (BMI) 27.0-27.9, adult: Secondary | ICD-10-CM | POA: Diagnosis not present

## 2022-03-06 DIAGNOSIS — E782 Mixed hyperlipidemia: Secondary | ICD-10-CM

## 2022-03-06 DIAGNOSIS — I10 Essential (primary) hypertension: Secondary | ICD-10-CM | POA: Diagnosis not present

## 2022-03-06 DIAGNOSIS — E559 Vitamin D deficiency, unspecified: Secondary | ICD-10-CM | POA: Diagnosis not present

## 2022-03-06 DIAGNOSIS — K219 Gastro-esophageal reflux disease without esophagitis: Secondary | ICD-10-CM | POA: Diagnosis not present

## 2022-03-06 DIAGNOSIS — N1832 Chronic kidney disease, stage 3b: Secondary | ICD-10-CM

## 2022-03-06 DIAGNOSIS — I739 Peripheral vascular disease, unspecified: Secondary | ICD-10-CM | POA: Diagnosis not present

## 2022-03-06 NOTE — Patient Instructions (Signed)

## 2022-03-06 NOTE — Progress Notes (Signed)
Subjective:    Patient ID: Frank Cook, male    DOB: [birth year], 88 y.o.   MRN: [id number]   Chief Complaint: medical management   HPI:  Frank Cook is a 88 y.o. who identifies as a male who was assigned male at birth.   Social history: Lives with: alone-nieces check on him daily. Wife is in nursing home. Work history: retired   Water engineer in today for follow up of the following chronic medical issues:  1. Primary hypertension No c/o chest pain, sob or headache. Does not check BP at home. BP Readings from Last 3 Encounters:  03/06/22 129/78  12/13/21 125/73  06/17/21 132/72     2. Mixed hyperlipidemia Eats what he is able to fix. Sometimes has meals that are prepared for him that he just heats up. Lab Results  Component Value Date   CHOL 135 12/13/2021   HDL 53 12/13/2021   LDLCALC 58 12/13/2021   TRIG 140 12/13/2021   CHOLHDL 2.5 12/13/2021   The ASCVD Risk score (Arnett DK, et al., 2019) failed to calculate for the following reasons:   The 2019 ASCVD risk score is only valid for ages 6 to 72   3. PVD (peripheral vascular disease) (HCC) No issues that bother him  4. Chronic atrial fibrillation (HCC) No palpitations or heart racing. Remains on eliquis; no bleeding issues  5. Gastroesophageal reflux disease without esophagitis On daily omeprazole; does well most days but occasionally still has symptoms.  6. Stage 3b chronic kidney disease (HCC) No voiding issues. Lab Results  Component Value Date   CREATININE 1.59 (H) 12/13/2021     7. Vitamin D deficiency Takes daily vitamin D supplement.  8. BMI 27.0-27.9,adult No recent weight change. Wt Readings from Last 3 Encounters:  03/06/22 180 lb (81.6 kg)  12/13/21 180 lb (81.6 kg)  08/12/21 185 lb (83.9 kg)    BMI Readings from Last 3 Encounters:  03/06/22 25.83 kg/m  12/13/21 25.83 kg/m  08/12/21 26.54 kg/m     New complaints: None today.  No Known Allergies Outpatient Encounter  Medications as of 03/06/2022  Medication Sig   apixaban (ELIQUIS) 2.5 MG TABS tablet Take 1 tablet (2.5 mg total) by mouth 2 (two) times daily.   Cholecalciferol (VITAMIN D3) 5000 units CAPS Take 5,000 Units by mouth daily.   cilostazol (PLETAL) 100 MG tablet Take 1 tablet (100 mg total) by mouth 2 (two) times daily.   hydrochlorothiazide (HYDRODIURIL) 25 MG tablet Take 1 tablet (25 mg total) by mouth daily.   latanoprost (XALATAN) 0.005 % ophthalmic solution Place 1 drop into both eyes at bedtime.   lisinopril (ZESTRIL) 40 MG tablet Take 1 tablet (40 mg total) by mouth daily.   metoprolol succinate (TOPROL-XL) 100 MG 24 hr tablet Take 1 tablet (100 mg total) by mouth daily. with food   Omega 3 1000 MG CAPS Take 2,000 mg by mouth daily.   omeprazole (PRILOSEC) 20 MG capsule Take 1 capsule (20 mg total) by mouth daily.   simvastatin (ZOCOR) 20 MG tablet Take 1 tablet (20 mg total) by mouth daily.   TURMERIC PO Take by mouth.   No facility-administered encounter medications on file as of 03/06/2022.    Past Surgical History:  Procedure Laterality Date   CATARACT EXTRACTION Bilateral    COLONOSCOPY     None      Family History  Problem Relation Age of Onset   COPD Mother    Cancer Father  Bone   Cancer Brother        Bone   Stroke Maternal Grandfather    Arthritis Brother    Hypertension Brother       Controlled substance contract: n/a     Review of Systems  Constitutional:  Negative for diaphoresis.  Eyes:  Negative for pain.  Respiratory:  Negative for chest tightness and shortness of breath.   Cardiovascular:  Negative for chest pain, palpitations and leg swelling.  Gastrointestinal:  Negative for abdominal pain.  Endocrine: Negative for polydipsia.  Genitourinary:  Negative for difficulty urinating.  Musculoskeletal:  Positive for gait problem.       Pain in bilat legs; uses salonpas every night which helps and allows him to rest well Gait unsteady at times;  feels like he has slowed down quite a bit  Neurological:  Negative for dizziness, weakness and headaches.  Hematological:  Bruises/bleeds easily.       On Eliquis  All other systems reviewed and are negative.      Objective:   Physical Exam Vitals and nursing note reviewed.  Constitutional:      General: He is not in acute distress.    Appearance: Normal appearance. He is well-developed.  HENT:     Head: Normocephalic and atraumatic.     Right Ear: Tympanic membrane normal.     Left Ear: Tympanic membrane normal.     Nose: Nose normal.     Mouth/Throat:     Mouth: Mucous membranes are moist.     Pharynx: Oropharynx is clear. Uvula midline.  Eyes:     Conjunctiva/sclera: Conjunctivae normal.     Pupils: Pupils are equal, round, and reactive to light.  Neck:     Thyroid: No thyroid mass or thyromegaly.     Vascular: No carotid bruit or JVD.  Cardiovascular:     Rate and Rhythm: Normal rate. Rhythm irregular.     Pulses: Normal pulses.     Heart sounds: Normal heart sounds.  Pulmonary:     Effort: Pulmonary effort is normal.     Breath sounds: Normal breath sounds. No wheezing, rhonchi or rales.  Chest:     Chest wall: No tenderness.  Abdominal:     General: Bowel sounds are normal. There is no distension.     Palpations: Abdomen is soft. There is no mass.     Tenderness: There is no abdominal tenderness.  Musculoskeletal:     Cervical back: Normal range of motion.     Left lower leg: 1+ Edema present.     Comments: Ambulates with walker  Lymphadenopathy:     Cervical: No cervical adenopathy.  Skin:    General: Skin is warm and dry.     Capillary Refill: Capillary refill takes less than 2 seconds.  Neurological:     Mental Status: He is alert and oriented to person, place, and time.     Comments: Ambulates slowly with walker  Psychiatric:        Attention and Perception: Attention normal.        Mood and Affect: Mood and affect normal.        Behavior: Behavior  normal.        Thought Content: Thought content normal.   BP 129/78   Pulse 71   Temp 97.8 F (36.6 C) (Temporal)   Resp 20   Ht 5\' 10"  (1.778 m)   Wt 180 lb (81.6 kg)   SpO2 97%   BMI 25.83 kg/m  Assessment & Plan:  Frank Cook comes in today with chief complaint of Medical Management of Chronic Issues   Diagnosis and orders addressed:  1. Primary hypertension Low sodium diet     2. Mixed hyperlipidemia Low fat diet  3. PVD (peripheral vascular disease) ([place])   4. Chronic atrial fibrillation (HCC) Continue eliquis; report any falls or bleeding issues  5. Gastroesophageal reflux disease without esophagitis Avoid spicy foods Avoid eating 2 hours before bedtime  6. Stage 3b chronic kidney disease ([place])   7. Vitamin D deficiency Continue daily supplements  8. BMI 27.0-27.9,adult Discussed diet and exercise for person with BMI >25. Will recheck weight in 3-6 months   Labs pending Health Maintenance reviewed Diet and exercise encouraged  Follow up plan: 6 months  Collene Leyden, FNP student    Chevis Pretty, [place]

## 2022-03-14 DIAGNOSIS — Z23 Encounter for immunization: Secondary | ICD-10-CM | POA: Diagnosis not present

## 2022-03-14 NOTE — Progress Notes (Unsigned)
Cardiology Office Note   Date:  03/15/2022   ID:  Frank Cook, DOB 06-Jan-1932, MRN [id number]  PCP:  Chevis Pretty, FNP  Cardiologist:   None   Chief Complaint  Patient presents with   Atrial Fibrillation       History of Present Illness: Frank Cook is a 88 y.o. male who presents for follow-up of atrial fibrillation.  Since I last saw him I he has done okay from a cardiac standpoint. The patient denies any new symptoms such as chest discomfort, neck or arm discomfort. There has been no new shortness of breath, PND or orthopnea. There have been no reported palpitations, presyncope or syncope.  He just started using a walker because his gait and strength are off.  He finally stopped going up and down stairs for the most part.  He was doing his laundry but now this is done for him.    Past Medical History:  Diagnosis Date   Atrial fibrillation (HCC)    Colon polyps    GERD (gastroesophageal reflux disease)    Hyperlipidemia    Hypertension    Osteopenia     Past Surgical History:  Procedure Laterality Date   CATARACT EXTRACTION Bilateral    COLONOSCOPY     None       Current Outpatient Medications  Medication Sig Dispense Refill   apixaban (ELIQUIS) 2.5 MG TABS tablet Take 1 tablet (2.5 mg total) by mouth 2 (two) times daily. 180 tablet 1   Cholecalciferol (VITAMIN D3) 5000 units CAPS Take 5,000 Units by mouth daily.     cilostazol (PLETAL) 100 MG tablet Take 1 tablet (100 mg total) by mouth 2 (two) times daily. 180 tablet 1   hydrochlorothiazide (HYDRODIURIL) 25 MG tablet Take 1 tablet (25 mg total) by mouth daily. 90 tablet 1   latanoprost (XALATAN) 0.005 % ophthalmic solution Place 1 drop into both eyes at bedtime.     lisinopril (ZESTRIL) 40 MG tablet Take 1 tablet (40 mg total) by mouth daily. 90 tablet 1   metoprolol succinate (TOPROL-XL) 100 MG 24 hr tablet Take 1 tablet (100 mg total) by mouth daily. with food 90 tablet 1   Omega 3 1000 MG  CAPS Take 2,000 mg by mouth daily.     omeprazole (PRILOSEC) 20 MG capsule Take 1 capsule (20 mg total) by mouth daily. 90 capsule 1   simvastatin (ZOCOR) 20 MG tablet Take 1 tablet (20 mg total) by mouth daily. 90 tablet 1   TURMERIC PO Take by mouth.     No current facility-administered medications for this visit.    Allergies:   Patient has no known allergies.    ROS:  Please see the history of present illness.   Otherwise, review of systems are positive for none.   All other systems are reviewed and negative.    PHYSICAL EXAM: VS:  BP 110/72   Pulse 96   Ht 5' 10.5" (1.791 m)   Wt 179 lb (81.2 kg)   BMI 25.32 kg/m  , BMI Body mass index is 25.32 kg/m. GENERAL:  Well appearing NECK:  No jugular venous distention, waveform within normal limits, carotid upstroke brisk and symmetric, no bruits, no thyromegaly LUNGS:  Clear to auscultation bilaterally CHEST:  Unremarkable HEART:  PMI not displaced or sustained,S1 and S2 within normal limits, no S3, no clicks, no rubs, no murmurs, irregular  ABD:  Flat, positive bowel sounds normal in frequency in pitch, no bruits,  no rebound, no guarding, no midline pulsatile mass, no hepatomegaly, no splenomegaly EXT:  2 plus pulses throughout, mild right greater than left leg edema, no cyanosis no clubbing NEURO: Resting tremor  EKG:  EKG is  ordered today. The ekg ordered today demonstrates atrial fibrillation, rate 96, right bundle branch block, no acute ST-T wave changes, no change from previous.   Recent Labs: 12/13/2021: ALT 11; BUN 21; Creatinine, Ser 1.59; Hemoglobin 13.7; Platelets 219; Potassium 4.0; Sodium 140    Lipid Panel    Component Value Date/Time   CHOL 135 12/13/2021 1208   CHOL 135 10/24/2012 1056   TRIG 140 12/13/2021 1208   TRIG 112 09/17/2014 1141   TRIG 182 (H) 10/24/2012 1056   HDL 53 12/13/2021 1208   HDL 56 09/17/2014 1141   HDL 49 10/24/2012 1056   CHOLHDL 2.5 12/13/2021 1208   LDLCALC 58 12/13/2021 1208    LDLCALC 42 03/16/2014 1101   LDLCALC 50 10/24/2012 1056      Wt Readings from Last 3 Encounters:  03/15/22 179 lb (81.2 kg)  03/06/22 180 lb (81.6 kg)  12/13/21 180 lb (81.6 kg)      Other studies Reviewed: Additional studies/ records that were reviewed today include:  Labs. Review of the above records demonstrates: See elsewhere   ASSESSMENT AND PLAN:  ATRIAL FIB:  Frank Cook has a CHA2DS2 - VASc score of 3.   He tolerates anticoagulation.  He has had reasonable rate control.  No change in therapy.  HTN: His blood pressure is well controlled.  No change in therapy.   CKD: His creatinine is mildly elevated at 1.59.   This is stable.  No change in therapy.   Current medicines are reviewed at length with the patient today.  The patient does not have concerns regarding medicines.  The following changes have been made: None  Labs/ tests ordered today include: None  Orders Placed This Encounter  Procedures   EKG 12-Lead      Disposition:   FU with me in one year   Signed, Rollene Rotunda, MD  03/15/2022 3:11 PM    [facility]

## 2022-03-15 ENCOUNTER — Ambulatory Visit (INDEPENDENT_AMBULATORY_CARE_PROVIDER_SITE_OTHER): Payer: Medicare Other | Admitting: Cardiology

## 2022-03-15 ENCOUNTER — Encounter: Payer: Self-pay | Admitting: Cardiology

## 2022-03-15 VITALS — BP 110/72 | HR 96 | Ht 70.5 in | Wt 179.0 lb

## 2022-03-15 DIAGNOSIS — I10 Essential (primary) hypertension: Secondary | ICD-10-CM | POA: Diagnosis not present

## 2022-03-15 DIAGNOSIS — N1832 Chronic kidney disease, stage 3b: Secondary | ICD-10-CM

## 2022-03-15 DIAGNOSIS — M79606 Pain in leg, unspecified: Secondary | ICD-10-CM

## 2022-03-15 DIAGNOSIS — I482 Chronic atrial fibrillation, unspecified: Secondary | ICD-10-CM | POA: Diagnosis not present

## 2022-03-15 NOTE — Patient Instructions (Signed)
Medication Instructions:  The current medical regimen is effective;  continue present plan and medications.  *If you need a refill on your cardiac medications before your next appointment, please call your pharmacy*  Follow-Up: At [facility], you and your health needs are our priority.  As part of our continuing mission to provide you with exceptional heart care, we have created designated Provider Care Teams.  These Care Teams include your primary Cardiologist (physician) and Advanced Practice Providers (APPs -  Physician Assistants and Nurse Practitioners) who all work together to provide you with the care you need, when you need it.  We recommend signing up for the patient portal called "MyChart".  Sign up information is provided on this After Visit Summary.  MyChart is used to connect with patients for Virtual Visits (Telemedicine).  Patients are able to view lab/test results, encounter notes, upcoming appointments, etc.  Non-urgent messages can be sent to your provider as well.   To learn more about what you can do with MyChart, go to [email or web address].    Your next appointment:   1 year(s)  The format for your next appointment:   In Person  Provider:   James Hochrein, MD     Important Information About Sugar       

## 2022-03-21 ENCOUNTER — Ambulatory Visit: Payer: Medicare Other | Admitting: Pharmacist

## 2022-03-21 DIAGNOSIS — I482 Chronic atrial fibrillation, unspecified: Secondary | ICD-10-CM

## 2022-03-21 NOTE — Progress Notes (Signed)
   Care Management   Follow Up Note   03/21/2022 Name: Frank Cook MRN: [id number] DOB: [birth year]   Referred by: Chevis Pretty, FNP Reason for referral : Chronic Care Management and Atrial Fibrillation  Discussed medications with patient and niece.  No changes necessary.  We could consider changing statin to rosuvastatin or pravastatin due to reported muscle pain, but patient has lived with muscle aches/pains for a long period of time.  She reports patient is 80% better after having COVID vaccine approx 7 days prior.  He is up and moving around more.   Patient is stable on blood thinner medications.  He is receiving great care at home from his family.  Encouraged patient and niece to make appt with PCP if he continues to feel ill.  Encouraged patient/caregiver to reach out to PharmD as needed in the future.   Regina Eck, PharmD, BCPS Clinical Pharmacist, [place]  II Phone [phone]

## 2022-06-06 ENCOUNTER — Ambulatory Visit: Payer: Medicare Other | Admitting: Nurse Practitioner

## 2022-06-13 ENCOUNTER — Ambulatory Visit (INDEPENDENT_AMBULATORY_CARE_PROVIDER_SITE_OTHER): Payer: Medicare Other | Admitting: Nurse Practitioner

## 2022-06-13 ENCOUNTER — Encounter: Payer: Self-pay | Admitting: Nurse Practitioner

## 2022-06-13 VITALS — BP 121/81 | HR 66 | Temp 96.7°F | Resp 20 | Ht 70.0 in | Wt 177.0 lb

## 2022-06-13 DIAGNOSIS — E782 Mixed hyperlipidemia: Secondary | ICD-10-CM

## 2022-06-13 DIAGNOSIS — N1832 Chronic kidney disease, stage 3b: Secondary | ICD-10-CM

## 2022-06-13 DIAGNOSIS — E559 Vitamin D deficiency, unspecified: Secondary | ICD-10-CM | POA: Diagnosis not present

## 2022-06-13 DIAGNOSIS — I10 Essential (primary) hypertension: Secondary | ICD-10-CM

## 2022-06-13 DIAGNOSIS — Z6827 Body mass index (BMI) 27.0-27.9, adult: Secondary | ICD-10-CM

## 2022-06-13 DIAGNOSIS — I482 Chronic atrial fibrillation, unspecified: Secondary | ICD-10-CM

## 2022-06-13 DIAGNOSIS — I129 Hypertensive chronic kidney disease with stage 1 through stage 4 chronic kidney disease, or unspecified chronic kidney disease: Secondary | ICD-10-CM

## 2022-06-13 DIAGNOSIS — K219 Gastro-esophageal reflux disease without esophagitis: Secondary | ICD-10-CM | POA: Diagnosis not present

## 2022-06-13 DIAGNOSIS — I739 Peripheral vascular disease, unspecified: Secondary | ICD-10-CM

## 2022-06-13 LAB — LIPID PANEL

## 2022-06-13 MED ORDER — SIMVASTATIN 20 MG PO TABS: 20.0000 mg | ORAL_TABLET | Freq: Every day | ORAL | 1 refills | Status: DC

## 2022-06-13 MED ORDER — APIXABAN 2.5 MG PO TABS: ORAL_TABLET | ORAL | 1 refills | Status: DC

## 2022-06-13 MED ORDER — HYDROCHLOROTHIAZIDE 25 MG PO TABS
25.0000 mg | ORAL_TABLET | Freq: Every day | ORAL | 1 refills | Status: DC
Start: 2022-06-13 — End: 2022-12-12

## 2022-06-13 MED ORDER — OMEPRAZOLE 20 MG PO CPDR: 20.0000 mg | DELAYED_RELEASE_CAPSULE | Freq: Every day | ORAL | 1 refills | Status: DC

## 2022-06-13 MED ORDER — LISINOPRIL 40 MG PO TABS: 40.0000 mg | ORAL_TABLET | Freq: Every day | ORAL | 1 refills | Status: DC

## 2022-06-13 MED ORDER — CILOSTAZOL 100 MG PO TABS: 100.0000 mg | ORAL_TABLET | Freq: Two times a day (BID) | ORAL | 1 refills | Status: DC

## 2022-06-13 MED ORDER — METOPROLOL SUCCINATE ER 100 MG PO TB24: 100.0000 mg | ORAL_TABLET | Freq: Every day | ORAL | 1 refills | Status: DC

## 2022-06-13 NOTE — Patient Instructions (Signed)

## 2022-06-13 NOTE — Progress Notes (Signed)
Subjective:    Patient ID: Frank Cook, male    DOB: [birth year], 89 y.o.   MRN: [id number]   Chief Complaint: medical management of chronic issues     HPI:  Frank Cook is a 89 y.o. who identifies as a male who was assigned male at birth.   Social history: Lives with: by himself- his wife is in nursing home. Work history: retired   Water engineer in today for follow up of the following chronic medical issues:  1. Primary hypertension No c/o chest pain, sob or headache. Doe snot check blood pressure at home. BP Readings from Last 3 Encounters:  03/15/22 110/72  03/06/22 129/78  12/13/21 125/73     2. Mixed hyperlipidemia Does watch diet- actually has poor appetite. Does no exercise at all. Lab Results  Component Value Date   CHOL 135 12/13/2021   HDL 53 12/13/2021   LDLCALC 58 12/13/2021   TRIG 140 12/13/2021   CHOLHDL 2.5 12/13/2021     3. Chronic atrial fibrillation (HCC) Denies palpitations or heart racing. Is on eliquis and is doing well.  4. Gastroesophageal reflux disease without esophagitis Is on omeprazole and is doing well.  5. PVD (peripheral vascular disease) (HCC) Takes pletal and says he hs no cramping or pain in lower ext.  6. Stage 3b chronic kidney disease (HCC) No voiding issues Lab Results  Component Value Date   CREATININE 1.59 (H) 12/13/2021     7. Vitamin D deficiency Is on a weekly vitamin d supplement  8. BMI 27.0-27.9,adult Weight is down 2lbs Wt Readings from Last 3 Encounters:  06/13/22 177 lb (80.3 kg)  03/15/22 179 lb (81.2 kg)  03/06/22 180 lb (81.6 kg)   BMI Readings from Last 3 Encounters:  06/13/22 25.40 kg/m  03/15/22 25.32 kg/m  03/06/22 25.83 kg/m     New complaints: None today  No Known Allergies Outpatient Encounter Medications as of 06/13/2022  Medication Sig   apixaban (ELIQUIS) 2.5 MG TABS tablet Take 1 tablet (2.5 mg total) by mouth 2 (two) times daily.   Cholecalciferol (VITAMIN D3) 5000 units CAPS  Take 5,000 Units by mouth daily.   cilostazol (PLETAL) 100 MG tablet Take 1 tablet (100 mg total) by mouth 2 (two) times daily.   hydrochlorothiazide (HYDRODIURIL) 25 MG tablet Take 1 tablet (25 mg total) by mouth daily.   latanoprost (XALATAN) 0.005 % ophthalmic solution Place 1 drop into both eyes at bedtime.   lisinopril (ZESTRIL) 40 MG tablet Take 1 tablet (40 mg total) by mouth daily.   metoprolol succinate (TOPROL-XL) 100 MG 24 hr tablet Take 1 tablet (100 mg total) by mouth daily. with food   Omega 3 1000 MG CAPS Take 2,000 mg by mouth daily.   omeprazole (PRILOSEC) 20 MG capsule Take 1 capsule (20 mg total) by mouth daily.   simvastatin (ZOCOR) 20 MG tablet Take 1 tablet (20 mg total) by mouth daily.   TURMERIC PO Take by mouth.   No facility-administered encounter medications on file as of 06/13/2022.    Past Surgical History:  Procedure Laterality Date   CATARACT EXTRACTION Bilateral    COLONOSCOPY     None      Family History  Problem Relation Age of Onset   COPD Mother    Cancer Father        Bone   Cancer Brother        Bone   Stroke Maternal Grandfather    Arthritis Brother  Hypertension Brother       Controlled substance contract: n/a     Review of Systems  Constitutional:  Negative for diaphoresis.  Eyes:  Negative for pain.  Respiratory:  Negative for shortness of breath.   Cardiovascular:  Negative for chest pain, palpitations and leg swelling.  Gastrointestinal:  Negative for abdominal pain.  Endocrine: Negative for polydipsia.  Skin:  Negative for rash.  Neurological:  Negative for dizziness, weakness and headaches.  Hematological:  Does not bruise/bleed easily.  All other systems reviewed and are negative.      Objective:   Physical Exam Vitals and nursing note reviewed.  Constitutional:      Appearance: Normal appearance. He is well-developed.  HENT:     Head: Normocephalic.     Nose: Nose normal.     Mouth/Throat:     Mouth: Mucous  membranes are moist.     Pharynx: Oropharynx is clear.  Eyes:     Pupils: Pupils are equal, round, and reactive to light.  Neck:     Thyroid: No thyroid mass or thyromegaly.     Vascular: No carotid bruit or JVD.     Trachea: Phonation normal.  Cardiovascular:     Rate and Rhythm: Normal rate. Rhythm irregular.  Pulmonary:     Effort: Pulmonary effort is normal. No respiratory distress.     Breath sounds: Normal breath sounds.  Abdominal:     General: Bowel sounds are normal.     Palpations: Abdomen is soft.     Tenderness: There is no abdominal tenderness.  Musculoskeletal:        General: Normal range of motion.     Cervical back: Normal range of motion and neck supple.     Comments: Walking with cane  Lymphadenopathy:     Cervical: No cervical adenopathy.  Skin:    General: Skin is warm and dry.  Neurological:     Mental Status: He is alert and oriented to person, place, and time.  Psychiatric:        Behavior: Behavior normal.        Thought Content: Thought content normal.        Judgment: Judgment normal.    BP 121/81   Pulse 66   Temp (!) 96.7 F (35.9 C) (Temporal)   Resp 20   Ht 5\' 10"  (1.778 m)   Wt 177 lb (80.3 kg)   SpO2 96%   BMI 25.40 kg/m         Assessment & Plan:   [name] comes in today with chief complaint of Medical Management of Chronic Issues   Diagnosis and orders addressed:  1. Primary hypertension Low sodium diet - hydrochlorothiazide (HYDRODIURIL) 25 MG tablet; Take 1 tablet (25 mg total) by mouth daily.  Dispense: 90 tablet; Refill: 1 - lisinopril (ZESTRIL) 40 MG tablet; Take 1 tablet (40 mg total) by mouth daily.  Dispense: 90 tablet; Refill: 1 - metoprolol succinate (TOPROL-XL) 100 MG 24 hr tablet; Take 1 tablet (100 mg total) by mouth daily. with food  Dispense: 90 tablet; Refill: 1 - CBC with Differential/Platelet - CMP14+EGFR  2. Mixed hyperlipidemia Low fat diet - simvastatin (ZOCOR) 20 MG tablet; Take 1 tablet  (20 mg total) by mouth daily.  Dispense: 90 tablet; Refill: 1 - Lipid panel  3. Chronic atrial fibrillation (HCC) Avoid caffeine - apixaban (ELIQUIS) 2.5 MG TABS tablet; Take 1 tablet (2.5 mg total) by mouth 2 (two) times daily.  Dispense: 180 tablet; Refill:  1  4. Gastroesophageal reflux disease without esophagitis Avoid spicy foods Do not eat 2 hours prior to bedtime  - omeprazole (PRILOSEC) 20 MG capsule; Take 1 capsule (20 mg total) by mouth daily.  Dispense: 90 capsule; Refill: 1  5. PVD (peripheral vascular disease) (HCC) - cilostazol (PLETAL) 100 MG tablet; Take 1 tablet (100 mg total) by mouth 2 (two) times daily.  Dispense: 180 tablet; Refill: 1  6. Stage 3b chronic kidney disease ([place]) Labs pending  7. Vitamin D deficiency Continue vitamin d supplement  8. BMI 27.0-27.9,adult Discussed diet and exercise for person with BMI >25 Will recheck weight in 3-6 months    Labs pending Health Maintenance reviewed Diet and exercise encouraged  Follow up plan: 6 months   Mary-Margaret Hassell Done, FNP

## 2022-06-14 ENCOUNTER — Other Ambulatory Visit: Payer: Self-pay | Admitting: Nurse Practitioner

## 2022-06-14 DIAGNOSIS — K591 Functional diarrhea: Secondary | ICD-10-CM

## 2022-06-14 LAB — CMP14+EGFR
ALT: 11 IU/L (ref 0–44)
AST: 21 IU/L (ref 0–40)
Albumin/Globulin Ratio: 1.5 (ref 1.2–2.2)
Albumin: 4.4 g/dL (ref 3.7–4.7)
Alkaline Phosphatase: 37 IU/L — ABNORMAL LOW (ref 44–121)
BUN/Creatinine Ratio: 14 (ref 10–24)
BUN: 22 mg/dL (ref 8–27)
Bilirubin Total: 0.6 mg/dL (ref 0.0–1.2)
CO2: 25 mmol/L (ref 20–29)
Calcium: 9.6 mg/dL (ref 8.6–10.2)
Chloride: 101 mmol/L (ref 96–106)
Creatinine, Ser: 1.59 mg/dL — ABNORMAL HIGH (ref 0.76–1.27)
Globulin, Total: 2.9 g/dL (ref 1.5–4.5)
Glucose: 103 mg/dL — ABNORMAL HIGH (ref 70–99)
Potassium: 3.7 mmol/L (ref 3.5–5.2)
Sodium: 142 mmol/L (ref 134–144)
Total Protein: 7.3 g/dL (ref 6.0–8.5)
eGFR: 41 mL/min/{1.73_m2} — ABNORMAL LOW (ref >59)

## 2022-06-14 LAB — CBC WITH DIFFERENTIAL/PLATELET
Basophils Absolute: 0 10*3/uL (ref 0.0–0.2)
Basos: 1 %
EOS (ABSOLUTE): 0.1 10*3/uL (ref 0.0–0.4)
Eos: 2 %
Hematocrit: 38 % (ref 37.5–51.0)
Hemoglobin: 12.6 g/dL — ABNORMAL LOW (ref 13.0–17.7)
Immature Grans (Abs): 0 10*3/uL (ref 0.0–0.1)
Immature Granulocytes: 0 %
Lymphocytes Absolute: 1.5 10*3/uL (ref 0.7–3.1)
Lymphs: 24 %
MCH: 30.4 pg (ref 26.6–33.0)
MCHC: 33.2 g/dL (ref 31.5–35.7)
MCV: 92 fL (ref 79–97)
Monocytes Absolute: 0.4 10*3/uL (ref 0.1–0.9)
Monocytes: 7 %
Neutrophils Absolute: 4.1 10*3/uL (ref 1.4–7.0)
Neutrophils: 66 %
Platelets: 201 10*3/uL (ref 150–450)
RBC: 4.15 x10E6/uL (ref 4.14–5.80)
RDW: 14.1 % (ref 11.6–15.4)
WBC: 6.2 10*3/uL (ref 3.4–10.8)

## 2022-06-14 LAB — LIPID PANEL
Chol/HDL Ratio: 2.2 ratio (ref 0.0–5.0)
Cholesterol, Total: 125 mg/dL (ref 100–199)
HDL: 57 mg/dL (ref >39)
LDL Chol Calc (NIH): 51 mg/dL (ref 0–99)
Triglycerides: 91 mg/dL (ref 0–149)
VLDL Cholesterol Cal: 17 mg/dL (ref 5–40)

## 2022-06-28 ENCOUNTER — Other Ambulatory Visit: Payer: Medicare Other

## 2022-06-28 DIAGNOSIS — K591 Functional diarrhea: Secondary | ICD-10-CM | POA: Diagnosis not present

## 2022-07-03 ENCOUNTER — Other Ambulatory Visit: Payer: Self-pay | Admitting: *Deleted

## 2022-07-03 DIAGNOSIS — D649 Anemia, unspecified: Secondary | ICD-10-CM

## 2022-07-03 LAB — CDIFF NAA+O+P+STOOL CULTURE
E coli, Shiga toxin Assay: NEGATIVE
Toxigenic C. Difficile by PCR: NEGATIVE

## 2022-07-04 ENCOUNTER — Other Ambulatory Visit: Payer: Medicare Other

## 2022-07-04 DIAGNOSIS — D649 Anemia, unspecified: Secondary | ICD-10-CM | POA: Diagnosis not present

## 2022-07-04 LAB — CBC WITH DIFFERENTIAL/PLATELET
Basophils Absolute: 0 10*3/uL (ref 0.0–0.2)
Basos: 1 %
EOS (ABSOLUTE): 0.2 10*3/uL (ref 0.0–0.4)
Eos: 3 %
Hematocrit: 37.4 % — ABNORMAL LOW (ref 37.5–51.0)
Hemoglobin: 12.7 g/dL — ABNORMAL LOW (ref 13.0–17.7)
Immature Grans (Abs): 0 10*3/uL (ref 0.0–0.1)
Immature Granulocytes: 1 %
Lymphocytes Absolute: 1.6 10*3/uL (ref 0.7–3.1)
Lymphs: 30 %
MCH: 30.5 pg (ref 26.6–33.0)
MCHC: 34 g/dL (ref 31.5–35.7)
MCV: 90 fL (ref 79–97)
Monocytes Absolute: 0.4 10*3/uL (ref 0.1–0.9)
Monocytes: 7 %
Neutrophils Absolute: 3.1 10*3/uL (ref 1.4–7.0)
Neutrophils: 58 %
Platelets: 210 10*3/uL (ref 150–450)
RBC: 4.17 x10E6/uL (ref 4.14–5.80)
RDW: 13.2 % (ref 11.6–15.4)
WBC: 5.3 10*3/uL (ref 3.4–10.8)

## 2022-08-16 ENCOUNTER — Ambulatory Visit (INDEPENDENT_AMBULATORY_CARE_PROVIDER_SITE_OTHER): Payer: Medicare Other

## 2022-08-16 VITALS — Ht 70.0 in | Wt 177.0 lb

## 2022-08-16 DIAGNOSIS — Z0000 Encounter for general adult medical examination without abnormal findings: Secondary | ICD-10-CM

## 2022-08-16 NOTE — Progress Notes (Signed)
Subjective:   Frank Cook is a 89 y.o. male who presents for Medicare Annual/Subsequent preventive examination. I connected with  Frank Cook on 08/16/22 by a audio enabled telemedicine application and verified that I am speaking with the correct person using two identifiers.  Patient Location: Home  Provider Location: Home Office  I discussed the limitations of evaluation and management by telemedicine. The patient expressed understanding and agreed to proceed.  Review of Systems     Cardiac Risk Factors include: advanced age (>27mn, >>48women);male gender;dyslipidemia;hypertension     Objective:    Today's Vitals   08/16/22 1025  Weight: 177 lb (80.3 kg)  Height: '5\' 10"'$  (1.778 m)   Body mass index is 25.4 kg/m.     08/16/2022   10:27 AM 08/12/2021   10:47 AM 08/11/2020   10:38 AM 08/04/2019   10:22 AM 07/31/2018   11:34 AM 07/26/2017   11:24 AM 07/19/2016    3:29 PM  Advanced Directives  Does Patient Have a Medical Advance Directive? Yes Yes Yes Yes Yes Yes Yes  Type of AParamedicof [place]Living will [place] will Living will;Healthcare Power of [place]Living will Living will [place] will [place] will  Does patient want to make changes to medical advance directive? No - Patient declined  No - Patient declined  No - Patient declined  No - Patient declined  Copy of [place] Chart? Yes - validated most recent copy scanned in chart (See row information) Yes - validated most recent copy scanned in chart (See row information) No - copy requested Yes - validated most recent copy scanned in chart (See row information)  Yes Yes    Current Medications (verified) Outpatient Encounter Medications as of 08/16/2022  Medication Sig   apixaban (ELIQUIS) 2.5 MG TABS tablet Take 1 tablet (2.5 mg total) by mouth 2 (two) times  daily.   Cholecalciferol (VITAMIN D3) 5000 units CAPS Take 5,000 Units by mouth daily.   cilostazol (PLETAL) 100 MG tablet Take 1 tablet (100 mg total) by mouth 2 (two) times daily.   hydrochlorothiazide (HYDRODIURIL) 25 MG tablet Take 1 tablet (25 mg total) by mouth daily.   latanoprost (XALATAN) 0.005 % ophthalmic solution Place 1 drop into both eyes at bedtime.   lisinopril (ZESTRIL) 40 MG tablet Take 1 tablet (40 mg total) by mouth daily.   metoprolol succinate (TOPROL-XL) 100 MG 24 hr tablet Take 1 tablet (100 mg total) by mouth daily. with food   Omega 3 1000 MG CAPS Take 2,000 mg by mouth daily.   omeprazole (PRILOSEC) 20 MG capsule Take 1 capsule (20 mg total) by mouth daily.   simvastatin (ZOCOR) 20 MG tablet Take 1 tablet (20 mg total) by mouth daily.   TURMERIC PO Take by mouth.   No facility-administered encounter medications on file as of 08/16/2022.    Allergies (verified) Patient has no known allergies.   History: Past Medical History:  Diagnosis Date   Atrial fibrillation (HCC)    Colon polyps    GERD (gastroesophageal reflux disease)    Hyperlipidemia    Hypertension    Osteopenia    Past Surgical History:  Procedure Laterality Date   CATARACT EXTRACTION Bilateral    COLONOSCOPY     None     Family History  Problem Relation Age of Onset   COPD Mother    Cancer Father  Bone   Cancer Brother        Bone   Stroke Maternal Grandfather    Arthritis Brother    Hypertension Brother    Social History   Socioeconomic History   Marital status: Married    Spouse name: Not on file   Number of children: 0   Years of education: Not on file   Highest education level: High school graduate  Occupational History   Occupation: Retired    Fish farm manager: [employer]  Tobacco Use   Smoking status: Never   Smokeless tobacco: Never   Tobacco comments:    Never smoker   Vaping Use   Vaping Use: Never used  Substance and Sexual Activity   Alcohol use: No   Drug use:  No   Sexual activity: Not on file  Other Topics Concern   Not on file  Social History Narrative   Lives alone.  Wife is now in nursing home.   His niece and nephew check on him frequently; he has a close friend that helps him around the house   Niece drives him out of town and helps with shopping   Social Determinants of Health   Financial Resource Strain: [place]  (08/16/2022)   Overall Financial Resource Strain (CARDIA)    Difficulty of Paying Living Expenses: Not hard at all  Food Insecurity: No Food Insecurity (08/16/2022)   Hunger Vital Sign    Worried About Running Out of Food in the Last Year: Never true    [place] in the Last Year: Never true  Transportation Needs: No Transportation Needs (08/16/2022)   PRAPARE - Hydrologist (Medical): No    Lack of Transportation (Non-Medical): No  Physical Activity: Insufficiently Active (08/16/2022)   Exercise Vital Sign    Days of Exercise per Week: 3 days    Minutes of Exercise per Session: 30 min  Stress: No Stress Concern Present (08/16/2022)   [place]    Feeling of Stress : Not at all  Social Connections: Moderately Isolated (08/16/2022)   Social Connection and Isolation Panel [NHANES]    Frequency of Communication with Friends and Family: More than three times a week    Frequency of Social Gatherings with Friends and Family: More than three times a week    Attends Religious Services: Never    Marine scientist or Organizations: No    Attends Music therapist: Never    Marital Status: Married    Tobacco Counseling Counseling given: Not Answered Tobacco comments: Never smoker    Clinical Intake:  Pre-visit preparation completed: Yes  Pain : No/denies pain     Nutritional Risks: None Diabetes: No  How often do you need to have someone help you when you read instructions, pamphlets, or other written  materials from your doctor or pharmacy?: 1 - Never  Diabetic?no   Interpreter Needed?: No  Information entered by :: Jadene Pierini, LPN   Activities of Daily Living    08/16/2022   10:27 AM  In your present state of health, do you have any difficulty performing the following activities:  Hearing? 0  Vision? 0  Difficulty concentrating or making decisions? 0  Walking or climbing stairs? 0  Dressing or bathing? 0  Doing errands, shopping? 0  Preparing Food and eating ? N  Using the Toilet? N  In the past six months, have you accidently leaked urine? N  Do you have problems with loss of bowel control? N  Managing your Medications? N  Managing your Finances? N  Housekeeping or managing your Housekeeping? N    Patient Care Team: Chevis Pretty, FNP as PCP - General (Nurse Practitioner) Minus Breeding, MD as Consulting Physician (Cardiology) Otelia Sergeant, OD as Referring Physician (Optometry) Blanca Friend Royce Macadamia, [facility] as Pharmacist (Family Medicine)  Indicate any recent Medical Services you may have received from other than [facility] providers in the past year (date may be approximate).     Assessment:   This is a routine wellness examination for Frank Cook.  Hearing/Vision screen Vision Screening - Comments:: Wears rx glasses - up to date with routine eye exams with  Dr.Barts   Dietary issues and exercise activities discussed: Current Exercise Habits: Home exercise routine, Type of exercise: walking, Time (Minutes): 30, Frequency (Times/Week): 3, Weekly Exercise (Minutes/Week): 90, Intensity: Mild, Exercise limited by: None identified   Goals Addressed             This Visit's Progress    Exercise 3x per week (30 min per time)   On track      Depression Screen    08/16/2022   10:26 AM 06/13/2022   10:53 AM 03/06/2022    3:41 PM 12/13/2021   11:36 AM 12/13/2021   11:35 AM 08/12/2021   10:41 AM 06/17/2021    1:59 PM  PHQ 2/9 Scores  PHQ - 2 Score 0 '1 2 2 '$ 0 1 0  PHQ- 9  Score  '4 14 12 '$ 0      Fall Risk    08/16/2022   10:25 AM 06/13/2022   10:53 AM 03/06/2022    3:41 PM 12/13/2021   11:35 AM 08/12/2021   10:35 AM  Fall Risk   Falls in the past year? 0 '1 1 1 1  '$ Number falls in past yr: 0 0 1 1 0  Injury with Fall? 0 0 0 0 0  Risk for fall due to : No Fall Risks History of fall(s) History of fall(s) History of fall(s) History of fall(s);Impaired balance/gait;Mental status change  Follow up Falls prevention discussed Education provided Education provided Education provided Falls prevention discussed    FALL RISK PREVENTION PERTAINING TO THE HOME:  Any stairs in or around the home? Yes  If so, are there any without handrails? No  Home free of loose throw rugs in walkways, pet beds, electrical cords, etc? Yes  Adequate lighting in your home to reduce risk of falls? Yes   ASSISTIVE DEVICES UTILIZED TO PREVENT FALLS:  Life alert? Yes  Use of a cane, walker or w/c? Yes  Grab bars in the bathroom? Yes  Shower chair or bench in shower? Yes  Elevated toilet seat or a handicapped toilet? Yes       07/31/2018   11:42 AM 07/26/2017   11:27 AM 07/19/2016    4:13 PM  MMSE - Mini Mental State Exam  Orientation to time '5 5 5  '$ Orientation to Place '5 5 5  '$ Registration '3 3 3  '$ Attention/ Calculation '5 5 4  '$ Recall '2 3 1  '$ Language- name 2 objects '2 2 2  '$ Language- repeat '1 1 1  '$ Language- follow 3 step command '3 3 3  '$ Language- read & follow direction '1 1 1  '$ Write a sentence '1 1 1  '$ Copy design '1 1 1  '$ Total score '29 30 27        '$ 08/16/2022   10:28 AM 08/12/2021  10:43 AM 08/11/2020   10:40 AM 08/04/2019   10:29 AM  6CIT Screen  What Year? 0 points 0 points 0 points 0 points  What month? 0 points 0 points 0 points 0 points  What time? 0 points 0 points 0 points 0 points  Count back from 20 0 points 0 points 0 points 0 points  Months in reverse 0 points 0 points 0 points 0 points  Repeat phrase 2 points 2 points 6 points 2 points  Total Score 2 points 2 points  6 points 2 points    Immunizations Immunization History  Administered Date(s) Administered   COVID-19, mRNA, vaccine(Comirnaty)12 years and older 03/14/2022   Fluad Quad(high Dose 65+) 03/05/2020, 04/05/2021, 02/28/2022, 02/28/2022, 02/28/2022   Influenza, High Dose Seasonal PF 04/03/2016, 04/02/2017   Influenza,inj,Quad PF,6+ Mos 02/24/2013, 03/16/2014, 03/29/2015, 03/18/2019   Influenza,inj,quad, With Preservative 03/18/2019   Influenza-Unspecified 03/13/2018, 03/04/2021   Moderna Covid-19 Vaccine Bivalent Booster 10yr & up 04/05/2021   Moderna SARS-COV2 Booster Vaccination 03/31/2020, 10/26/2020   Moderna Sars-Covid-2 Vaccination 07/01/2019, 07/29/2019   Pneumococcal Conjugate-13 09/17/2014   Pneumococcal Polysaccharide-23 06/06/1999   Tdap 03/06/2011, 06/17/2021   Zoster Recombinat (Shingrix) 08/12/2018, 11/12/2018   Zoster, Live 04/06/2007    TDAP status: Up to date  Flu Vaccine status: Up to date  Pneumococcal vaccine status: Up to date  Covid-19 vaccine status: Completed vaccines  Qualifies for Shingles Vaccine? Yes   Zostavax completed Yes   Shingrix Completed?: Yes  Screening Tests Health Maintenance  Topic Date Due   Medicare Annual Wellness (AWV)  08/16/2023   DTaP/Tdap/Td (3 - Td or Tdap) 06/18/2031   Pneumonia Vaccine 89 Years old  Completed   INFLUENZA VACCINE  Completed   COVID-19 Vaccine  Completed   Zoster Vaccines- Shingrix  Completed   HPV VACCINES  Aged Out    Health Maintenance  There are no preventive care reminders to display for this patient.   Colorectal cancer screening: No longer required.   Lung Cancer Screening: (Low Dose CT Chest recommended if Age 87-80years, 30 pack-year currently smoking OR have quit w/in 15years.) does not qualify.   Lung Cancer Screening Referral: n/a  Additional Screening:  Hepatitis C Screening: does not qualify;   Vision Screening: Recommended annual ophthalmology exams for early detection of  glaucoma and other disorders of the eye. Is the patient up to date with their annual eye exam?  Yes  Who is the provider or what is the name of the office in which the patient attends annual eye exams? Dr.Barts  If pt is not established with a provider, would they like to be referred to a provider to establish care? No .   Dental Screening: Recommended annual dental exams for proper oral hygiene  Community Resource Referral / Chronic Care Management: CRR required this visit?  No   CCM required this visit?  No      Plan:     I have personally reviewed and noted the following in the patient's chart:   Medical and social history Use of alcohol, tobacco or illicit drugs  Current medications and supplements including opioid prescriptions. Patient is not currently taking opioid prescriptions. Functional ability and status Nutritional status Physical activity Advanced directives List of other physicians Hospitalizations, surgeries, and ER visits in previous 12 months Vitals Screenings to include cognitive, depression, and falls Referrals and appointments  In addition, I have reviewed and discussed with patient certain preventive protocols, quality metrics, and best practice recommendations. A written personalized care  plan for preventive services as well as general preventive health recommendations were provided to patient.     Daphane Shepherd, LPN   [id number]   Nurse Notes: none

## 2022-08-16 NOTE — Patient Instructions (Signed)
Frank Cook , Thank you for taking time to come for your Medicare Wellness Visit. I appreciate your ongoing commitment to your health goals. Please review the following plan we discussed and let me know if I can assist you in the future.   These are the goals we discussed:  Goals      awv     08/04/2019 AWV Goal: Exercise for General Health  Patient will verbalize understanding of the benefits of increased physical activity: Exercising regularly is important. It will improve your overall fitness, flexibility, and endurance. Regular exercise also will improve your overall health. It can help you control your weight, reduce stress, and improve your bone density. Over the next year, patient will increase physical activity as tolerated with a goal of at least 150 minutes of moderate physical activity per week.  You can tell that you are exercising at a moderate intensity if your heart starts beating faster and you start breathing faster but can still hold a conversation. Moderate-intensity exercise ideas include: Walking 1 mile (1.6 km) in about 15 minutes Biking Hiking Golfing Dancing Water aerobics Patient will verbalize understanding of everyday activities that increase physical activity by providing examples like the following: Yard work, such as: Sales promotion account executive Gardening Washing windows or floors Patient will be able to explain general safety guidelines for exercising:  Before you start a new exercise program, talk with your health care provider. Do not exercise so much that you hurt yourself, feel dizzy, or get very short of breath. Wear comfortable clothes and wear shoes with good support. Drink plenty of water while you exercise to prevent dehydration or heat stroke. Work out until your breathing and your heartbeat get faster.      Exercise 3x per week (30 min per time)     Patient Stated      08/11/2020 AWV Goal: Fall Prevention  Over the next year, patient will decrease their risk for falls by: Using assistive devices, such as a cane or walker, as needed Identifying fall risks within their home and correcting them by: Removing throw rugs Adding handrails to stairs or ramps Removing clutter and keeping a clear pathway throughout the home Increasing light, especially at night Adding shower handles/bars Raising toilet seat Identifying potential personal risk factors for falls: Medication side effects Incontinence/urgency Vestibular dysfunction Hearing loss Musculoskeletal disorders Neurological disorders Orthostatic hypotension          This is a list of the screening recommended for you and due dates:  Health Maintenance  Topic Date Due   Medicare Annual Wellness Visit  08/16/2023   DTaP/Tdap/Td vaccine (3 - Td or Tdap) 06/18/2031   Pneumonia Vaccine  Completed   Flu Shot  Completed   COVID-19 Vaccine  Completed   Zoster (Shingles) Vaccine  Completed   HPV Vaccine  Aged Out    Advanced directives: In Chart   Conditions/risks identified: Aim for 30 minutes of exercise or brisk walking, 6-8 glasses of water, and 5 servings of fruits and vegetables each day.   Next appointment: Follow up in one year for your annual wellness visit.   Preventive Care 61 Years and Older, Male  Preventive care refers to lifestyle choices and visits with your health care provider that can promote health and wellness. What does preventive care include? A yearly physical exam. This is also called an annual well check. Dental exams once or twice a year. Routine  eye exams. Ask your health care provider how often you should have your eyes checked. Personal lifestyle choices, including: Daily care of your teeth and gums. Regular physical activity. Eating a healthy diet. Avoiding tobacco and drug use. Limiting alcohol use. Practicing safe sex. Taking low doses of aspirin every  day. Taking vitamin and mineral supplements as recommended by your health care provider. What happens during an annual well check? The services and screenings done by your health care provider during your annual well check will depend on your age, overall health, lifestyle risk factors, and family history of disease. Counseling  Your health care provider may ask you questions about your: Alcohol use. Tobacco use. Drug use. Emotional well-being. Home and relationship well-being. Sexual activity. Eating habits. History of falls. Memory and ability to understand (cognition). Work and work Statistician. Screening  You may have the following tests or measurements: Height, weight, and BMI. Blood pressure. Lipid and cholesterol levels. These may be checked every 5 years, or more frequently if you are over 18 years old. Skin check. Lung cancer screening. You may have this screening every year starting at age 73 if you have a 30-pack-year history of smoking and currently smoke or have quit within the past 15 years. Fecal occult blood test (FOBT) of the stool. You may have this test every year starting at age 29. Flexible sigmoidoscopy or colonoscopy. You may have a sigmoidoscopy every 5 years or a colonoscopy every 10 years starting at age 87. Prostate cancer screening. Recommendations will vary depending on your family history and other risks. Hepatitis C blood test. Hepatitis B blood test. Sexually transmitted disease (STD) testing. Diabetes screening. This is done by checking your blood sugar (glucose) after you have not eaten for a while (fasting). You may have this done every 1-3 years. Abdominal aortic aneurysm (AAA) screening. You may need this if you are a current or former smoker. Osteoporosis. You may be screened starting at age 3 if you are at high risk. Talk with your health care provider about your test results, treatment options, and if necessary, the need for more  tests. Vaccines  Your health care provider may recommend certain vaccines, such as: Influenza vaccine. This is recommended every year. Tetanus, diphtheria, and acellular pertussis (Tdap, Td) vaccine. You may need a Td booster every 10 years. Zoster vaccine. You may need this after age 57. Pneumococcal 13-valent conjugate (PCV13) vaccine. One dose is recommended after age 60. Pneumococcal polysaccharide (PPSV23) vaccine. One dose is recommended after age 59. Talk to your health care provider about which screenings and vaccines you need and how often you need them. This information is not intended to replace advice given to you by your health care provider. Make sure you discuss any questions you have with your health care provider. Document Released: 06/18/2015 Document Revised: 02/09/2016 Document Reviewed: 03/23/2015 Elsevier Interactive Patient Education  2017 [employer] Prevention in the Home Falls can cause injuries. They can happen to people of all ages. There are many things you can do to make your home safe and to help prevent falls. What can I do on the outside of my home? Regularly fix the edges of walkways and driveways and fix any cracks. Remove anything that might make you trip as you walk through a door, such as a raised step or threshold. Trim any bushes or trees on the path to your home. Use bright outdoor lighting. Clear any walking paths of anything that might make someone trip, such  as rocks or tools. Regularly check to see if handrails are loose or broken. Make sure that both sides of any steps have handrails. Any raised decks and porches should have guardrails on the edges. Have any leaves, snow, or ice cleared regularly. Use sand or salt on walking paths during winter. Clean up any spills in your garage right away. This includes oil or grease spills. What can I do in the bathroom? Use night lights. Install grab bars by the toilet and in the tub and shower.  Do not use towel bars as grab bars. Use non-skid mats or decals in the tub or shower. If you need to sit down in the shower, use a plastic, non-slip stool. Keep the floor dry. Clean up any water that spills on the floor as soon as it happens. Remove soap buildup in the tub or shower regularly. Attach bath mats securely with double-sided non-slip rug tape. Do not have throw rugs and other things on the floor that can make you trip. What can I do in the bedroom? Use night lights. Make sure that you have a light by your bed that is easy to reach. Do not use any sheets or blankets that are too big for your bed. They should not hang down onto the floor. Have a firm chair that has side arms. You can use this for support while you get dressed. Do not have throw rugs and other things on the floor that can make you trip. What can I do in the kitchen? Clean up any spills right away. Avoid walking on wet floors. Keep items that you use a lot in easy-to-reach places. If you need to reach something above you, use a strong step stool that has a grab bar. Keep electrical cords out of the way. Do not use floor polish or wax that makes floors slippery. If you must use wax, use non-skid floor wax. Do not have throw rugs and other things on the floor that can make you trip. What can I do with my stairs? Do not leave any items on the stairs. Make sure that there are handrails on both sides of the stairs and use them. Fix handrails that are broken or loose. Make sure that handrails are as long as the stairways. Check any carpeting to make sure that it is firmly attached to the stairs. Fix any carpet that is loose or worn. Avoid having throw rugs at the top or bottom of the stairs. If you do have throw rugs, attach them to the floor with carpet tape. Make sure that you have a light switch at the top of the stairs and the bottom of the stairs. If you do not have them, ask someone to add them for you. What else  can I do to help prevent falls? Wear shoes that: Do not have high heels. Have rubber bottoms. Are comfortable and fit you well. Are closed at the toe. Do not wear sandals. If you use a stepladder: Make sure that it is fully opened. Do not climb a closed stepladder. Make sure that both sides of the stepladder are locked into place. Ask someone to hold it for you, if possible. Clearly mark and make sure that you can see: Any grab bars or handrails. First and last steps. Where the edge of each step is. Use tools that help you move around (mobility aids) if they are needed. These include: Canes. Walkers. Scooters. Crutches. Turn on the lights when you go into a  dark area. Replace any light bulbs as soon as they burn out. Set up your furniture so you have a clear path. Avoid moving your furniture around. If any of your floors are uneven, fix them. If there are any pets around you, be aware of where they are. Review your medicines with your doctor. Some medicines can make you feel dizzy. This can increase your chance of falling. Ask your doctor what other things that you can do to help prevent falls. This information is not intended to replace advice given to you by your health care provider. Make sure you discuss any questions you have with your health care provider. Document Released: 03/18/2009 Document Revised: 10/28/2015 Document Reviewed: 06/26/2014 Elsevier Interactive Patient Education  2017 [employer].

## 2022-12-12 ENCOUNTER — Ambulatory Visit: Payer: Medicare Other | Admitting: Nurse Practitioner

## 2022-12-12 ENCOUNTER — Encounter: Payer: Self-pay | Admitting: Nurse Practitioner

## 2022-12-12 VITALS — BP 135/83 | HR 88 | Temp 97.3°F | Resp 20 | Ht 70.0 in | Wt 171.0 lb

## 2022-12-12 DIAGNOSIS — I10 Essential (primary) hypertension: Secondary | ICD-10-CM | POA: Diagnosis not present

## 2022-12-12 DIAGNOSIS — I482 Chronic atrial fibrillation, unspecified: Secondary | ICD-10-CM | POA: Diagnosis not present

## 2022-12-12 DIAGNOSIS — I739 Peripheral vascular disease, unspecified: Secondary | ICD-10-CM | POA: Diagnosis not present

## 2022-12-12 DIAGNOSIS — K219 Gastro-esophageal reflux disease without esophagitis: Secondary | ICD-10-CM | POA: Diagnosis not present

## 2022-12-12 DIAGNOSIS — E559 Vitamin D deficiency, unspecified: Secondary | ICD-10-CM

## 2022-12-12 DIAGNOSIS — Z6827 Body mass index (BMI) 27.0-27.9, adult: Secondary | ICD-10-CM | POA: Diagnosis not present

## 2022-12-12 DIAGNOSIS — N1832 Chronic kidney disease, stage 3b: Secondary | ICD-10-CM | POA: Diagnosis not present

## 2022-12-12 DIAGNOSIS — I129 Hypertensive chronic kidney disease with stage 1 through stage 4 chronic kidney disease, or unspecified chronic kidney disease: Secondary | ICD-10-CM

## 2022-12-12 DIAGNOSIS — E782 Mixed hyperlipidemia: Secondary | ICD-10-CM | POA: Diagnosis not present

## 2022-12-12 MED ORDER — CILOSTAZOL 100 MG PO TABS
100.0000 mg | ORAL_TABLET | Freq: Two times a day (BID) | ORAL | 1 refills | Status: DC
Start: 2022-12-12 — End: 2023-03-30

## 2022-12-12 MED ORDER — LISINOPRIL 40 MG PO TABS
40.0000 mg | ORAL_TABLET | Freq: Every day | ORAL | 1 refills | Status: DC
Start: 2022-12-12 — End: 2023-03-30

## 2022-12-12 MED ORDER — HYDROCHLOROTHIAZIDE 25 MG PO TABS
25.0000 mg | ORAL_TABLET | Freq: Every day | ORAL | 1 refills | Status: DC
Start: 2022-12-12 — End: 2023-03-30

## 2022-12-12 MED ORDER — SIMVASTATIN 20 MG PO TABS
20.0000 mg | ORAL_TABLET | Freq: Every day | ORAL | 1 refills | Status: DC
Start: 2022-12-12 — End: 2023-03-30

## 2022-12-12 MED ORDER — OMEPRAZOLE 20 MG PO CPDR: 20.0000 mg | DELAYED_RELEASE_CAPSULE | Freq: Every day | ORAL | 1 refills | Status: DC

## 2022-12-12 MED ORDER — APIXABAN 2.5 MG PO TABS: ORAL_TABLET | ORAL | 1 refills | Status: DC

## 2022-12-12 MED ORDER — METOPROLOL SUCCINATE ER 100 MG PO TB24: 100.0000 mg | ORAL_TABLET | Freq: Every day | ORAL | 1 refills | Status: DC

## 2022-12-12 NOTE — Progress Notes (Signed)
Subjective:    Patient ID: Frank Cook, male    DOB: 1932-03-31, 89 y.o.   MRN: [id number]   Chief Complaint: Medical Management of Chronic Issues    HPI:  Frank Cook is a 89 y.o. who identifies as a male who was assigned male at birth.   Social history: Lives with: by himself. His nieces check on him frequently Work history: retired   Water engineer in today for follow up of the following chronic medical issues:  1. Primary hypertension No c/o chest pain, sob or headache. Does not check blood pressure at home. BP Readings from Last 3 Encounters:  12/12/22 135/83  06/13/22 121/81  03/15/22 110/72     2. Chronic atrial fibrillation (HCC) No palpitations or heat racing  3. PVD (peripheral vascular disease) (HCC) Denies any lower ext pain or edema  4. Gastroesophageal reflux disease without esophagitis Is on omeprazole daily and is doing well.  5. Mixed hyperlipidemia Eats whatever family brings him to eat. Lab Results  Component Value Date   CHOL 125 06/13/2022   HDL 57 06/13/2022   LDLCALC 51 06/13/2022   TRIG 91 06/13/2022   CHOLHDL 2.2 06/13/2022     6. Vitamin D deficiency Is on daily vitamin d supplement  7. Stage 3b chronic kidney disease (HCC) No voiding issues Lab Results  Component Value Date   CREATININE 1.59 (H) 06/13/2022     8. BMI 27.0-27.9,adult Weight is down 6lbs  Wt Readings from Last 3 Encounters:  12/12/22 171 lb (77.6 kg)  08/16/22 177 lb (80.3 kg)  06/13/22 177 lb (80.3 kg)   BMI Readings from Last 3 Encounters:  12/12/22 24.54 kg/m  08/16/22 25.40 kg/m  06/13/22 25.40 kg/m     New complaints: None today  No Known Allergies Outpatient Encounter Medications as of 12/12/2022  Medication Sig   apixaban (ELIQUIS) 2.5 MG TABS tablet Take 1 tablet (2.5 mg total) by mouth 2 (two) times daily.   Cholecalciferol (VITAMIN D3) 5000 units CAPS Take 5,000 Units by mouth daily.   cilostazol (PLETAL) 100 MG tablet Take 1 tablet  (100 mg total) by mouth 2 (two) times daily.   Ferrous Sulfate (IRON PO) Take by mouth.   hydrochlorothiazide (HYDRODIURIL) 25 MG tablet Take 1 tablet (25 mg total) by mouth daily.   latanoprost (XALATAN) 0.005 % ophthalmic solution Place 1 drop into both eyes at bedtime.   lisinopril (ZESTRIL) 40 MG tablet Take 1 tablet (40 mg total) by mouth daily.   metoprolol succinate (TOPROL-XL) 100 MG 24 hr tablet Take 1 tablet (100 mg total) by mouth daily. with food   Omega 3 1000 MG CAPS Take 2,000 mg by mouth daily.   omeprazole (PRILOSEC) 20 MG capsule Take 1 capsule (20 mg total) by mouth daily.   Simethicone (GAS-X PO) Take by mouth.   simvastatin (ZOCOR) 20 MG tablet Take 1 tablet (20 mg total) by mouth daily.   [DISCONTINUED] TURMERIC PO Take by mouth.   No facility-administered encounter medications on file as of 12/12/2022.    Past Surgical History:  Procedure Laterality Date   CATARACT EXTRACTION Bilateral    COLONOSCOPY     None      Family History  Problem Relation Age of Onset   COPD Mother    Cancer Father        Bone   Cancer Brother        Bone   Stroke Maternal Grandfather    Arthritis Brother  Hypertension Brother       Controlled substance contract: n/a     Review of Systems  Constitutional:  Negative for diaphoresis.  Eyes:  Negative for pain.  Respiratory:  Negative for shortness of breath.   Cardiovascular:  Negative for chest pain, palpitations and leg swelling.  Gastrointestinal:  Negative for abdominal pain.  Endocrine: Negative for polydipsia.  Skin:  Negative for rash.  Neurological:  Negative for dizziness, weakness and headaches.  Hematological:  Does not bruise/bleed easily.  All other systems reviewed and are negative.      Objective:   Physical Exam Vitals and nursing note reviewed.  Constitutional:      Appearance: Normal appearance. He is well-developed.  HENT:     Head: Normocephalic.     Nose: Nose normal.     Mouth/Throat:      Mouth: Mucous membranes are moist.     Pharynx: Oropharynx is clear.  Eyes:     Pupils: Pupils are equal, round, and reactive to light.  Neck:     Thyroid: No thyroid mass or thyromegaly.     Vascular: No carotid bruit or JVD.     Trachea: Phonation normal.  Cardiovascular:     Rate and Rhythm: Normal rate and regular rhythm.  Pulmonary:     Effort: Pulmonary effort is normal. No respiratory distress.     Breath sounds: Normal breath sounds.  Abdominal:     General: Bowel sounds are normal.     Palpations: Abdomen is soft.     Tenderness: There is no abdominal tenderness.  Musculoskeletal:        General: Normal range of motion.     Cervical back: Normal range of motion and neck supple.     Right lower leg: Edema (1+) present.     Left lower leg: Edema (1+) present.  Lymphadenopathy:     Cervical: No cervical adenopathy.  Skin:    General: Skin is warm and dry.  Neurological:     Mental Status: He is alert and oriented to person, place, and time.  Psychiatric:        Behavior: Behavior normal.        Thought Content: Thought content normal.        Judgment: Judgment normal.    BP 135/83   Pulse 88   Temp (!) 97.3 F (36.3 C) (Temporal)   Resp 20   Ht 5\' 10"  (1.778 m)   Wt 171 lb (77.6 kg)   SpO2 98%   BMI 24.54 kg/m         Assessment & Plan:  Aceion Vaal Campton comes in today with chief complaint of Medical Management of Chronic Issues   Diagnosis and orders addressed:  1. Primary hypertension Low sodium diet - hydrochlorothiazide (HYDRODIURIL) 25 MG tablet; Take 1 tablet (25 mg total) by mouth daily.  Dispense: 90 tablet; Refill: 1 - lisinopril (ZESTRIL) 40 MG tablet; Take 1 tablet (40 mg total) by mouth daily.  Dispense: 90 tablet; Refill: 1 - metoprolol succinate (TOPROL-XL) 100 MG 24 hr tablet; Take 1 tablet (100 mg total) by mouth daily. with food  Dispense: 90 tablet; Refill: 1 - CBC with Differential/Platelet - CMP14+EGFR  2. Chronic atrial  fibrillation (HCC) Avoid caffeine - apixaban (ELIQUIS) 2.5 MG TABS tablet; Take 1 tablet (2.5 mg total) by mouth 2 (two) times daily.  Dispense: 180 tablet; Refill: 1  3. PVD (peripheral vascular disease) (HCC) - cilostazol (PLETAL) 100 MG tablet; Take 1 tablet (100 mg  total) by mouth 2 (two) times daily.  Dispense: 180 tablet; Refill: 1  4. Gastroesophageal reflux disease without esophagitis Avoid spicy foods Do not eat 2 hours prior to bedtime  - omeprazole (PRILOSEC) 20 MG capsule; Take 1 capsule (20 mg total) by mouth daily.  Dispense: 90 capsule; Refill: 1  5. Mixed hyperlipidemia Low fat diet - simvastatin (ZOCOR) 20 MG tablet; Take 1 tablet (20 mg total) by mouth daily.  Dispense: 90 tablet; Refill: 1 - Lipid panel  6. Vitamin D deficiency Continue vitamin d supplement  7. Stage 3b chronic kidney disease (HCC) Labs pending  8. BMI 27.0-27.9,adult Discussed diet and exercise for person with BMI >25 Will recheck weight in 3-6 months     Labs pending Health Maintenance reviewed Diet and exercise encouraged  Follow up plan: 6 months   Mary-Margaret Daphine Deutscher, FNP

## 2022-12-12 NOTE — Patient Instructions (Signed)
Peripheral Edema  Peripheral edema is swelling that is caused by a buildup of fluid. Peripheral edema most often affects the lower legs, ankles, and feet. It can also develop in the arms, hands, and face. The area of the body that has peripheral edema will look swollen. It may also feel heavy or warm. Your clothes may start to feel tight. Pressing on the area may make a temporary dent in your skin (pitting edema). You may not be able to move your swollen arm or leg as much as usual. There are many causes of peripheral edema. It can happen because of a complication of other conditions such as heart failure, kidney disease, or a problem with your circulation. It also can be a side effect of certain medicines or happen because of an infection. It often happens to women during pregnancy. Sometimes, the cause is not known. Follow these instructions at home: Managing pain, stiffness, and swelling  Raise (elevate) your legs while you are sitting or lying down. Move around often to prevent stiffness and to reduce swelling. Do not sit or stand for long periods of time. Do not wear tight clothing. Do not wear garters on your upper legs. Exercise your legs to get your circulation going. This helps to move the fluid back into your blood vessels, and it may help the swelling go down. Wear compression stockings as told by your health care provider. These stockings help to prevent blood clots and reduce swelling in your legs. It is important that these are the correct size. These stockings should be prescribed by your doctor to prevent possible injuries. If elastic bandages or wraps are recommended, use them as told by your health care provider. Medicines Take over-the-counter and prescription medicines only as told by your health care provider. Your health care provider may prescribe medicine to help your body get rid of excess water (diuretic). Take this medicine if you are told to take it. General  instructions Eat a low-salt (low-sodium) diet as told by your health care provider. Sometimes, eating less salt may reduce swelling. Pay attention to any changes in your symptoms. Moisturize your skin daily to help prevent skin from cracking and draining. Keep all follow-up visits. This is important. Contact a health care provider if: You have a fever. You have swelling in only one leg. You have increased swelling, redness, or pain in one or both of your legs. You have drainage or sores at the area where you have edema. Get help right away if: You have edema that starts suddenly or is getting worse, especially if you are pregnant or have a medical condition. You develop shortness of breath, especially when you are lying down. You have pain in your chest or abdomen. You feel weak. You feel like you will faint. These symptoms may be an emergency. Get help right away. Call 911. Do not wait to see if the symptoms will go away. Do not drive yourself to the hospital. Summary Peripheral edema is swelling that is caused by a buildup of fluid. Peripheral edema most often affects the lower legs, ankles, and feet. Move around often to prevent stiffness and to reduce swelling. Do not sit or stand for long periods of time. Pay attention to any changes in your symptoms. Contact a health care provider if you have edema that starts suddenly or is getting worse, especially if you are pregnant or have a medical condition. Get help right away if you develop shortness of breath, especially when lying down.   This information is not intended to replace advice given to you by your health care provider. Make sure you discuss any questions you have with your health care provider. Document Revised: 01/24/2021 Document Reviewed: 01/24/2021 Elsevier Patient Education  2024 [employer] Inc[employer]  

## 2022-12-13 LAB — CBC WITH DIFFERENTIAL/PLATELET
Basophils Absolute: 0 10*3/uL (ref 0.0–0.2)
Basos: 0 %
EOS (ABSOLUTE): 0.1 10*3/uL (ref 0.0–0.4)
Eos: 2 %
Hematocrit: 39.2 % (ref 37.5–51.0)
Hemoglobin: 12.9 g/dL — ABNORMAL LOW (ref 13.0–17.7)
Immature Grans (Abs): 0 10*3/uL (ref 0.0–0.1)
Immature Granulocytes: 0 %
Lymphocytes Absolute: 1.3 10*3/uL (ref 0.7–3.1)
Lymphs: 23 %
MCH: 30.2 pg (ref 26.6–33.0)
MCHC: 32.9 g/dL (ref 31.5–35.7)
MCV: 92 fL (ref 79–97)
Monocytes Absolute: 0.4 10*3/uL (ref 0.1–0.9)
Monocytes: 7 %
Neutrophils Absolute: 3.8 10*3/uL (ref 1.4–7.0)
Neutrophils: 68 %
Platelets: 183 10*3/uL (ref 150–450)
RBC: 4.27 x10E6/uL (ref 4.14–5.80)
RDW: 12.7 % (ref 11.6–15.4)
WBC: 5.7 10*3/uL (ref 3.4–10.8)

## 2022-12-13 LAB — CMP14+EGFR
ALT: 14 IU/L (ref 0–44)
AST: 20 IU/L (ref 0–40)
Albumin: 4.2 g/dL (ref 3.7–4.7)
Alkaline Phosphatase: 42 IU/L — ABNORMAL LOW (ref 44–121)
BUN/Creatinine Ratio: 12 (ref 10–24)
BUN: 16 mg/dL (ref 8–27)
Bilirubin Total: 0.5 mg/dL (ref 0.0–1.2)
CO2: 25 mmol/L (ref 20–29)
Calcium: 10 mg/dL (ref 8.6–10.2)
Chloride: 103 mmol/L (ref 96–106)
Creatinine, Ser: 1.38 mg/dL — ABNORMAL HIGH (ref 0.76–1.27)
Globulin, Total: 2.7 g/dL (ref 1.5–4.5)
Glucose: 104 mg/dL — ABNORMAL HIGH (ref 70–99)
Potassium: 4.2 mmol/L (ref 3.5–5.2)
Sodium: 141 mmol/L (ref 134–144)
Total Protein: 6.9 g/dL (ref 6.0–8.5)
eGFR: 49 mL/min/{1.73_m2} — ABNORMAL LOW (ref >59)

## 2022-12-13 LAB — LIPID PANEL
Chol/HDL Ratio: 2.3 ratio (ref 0.0–5.0)
Cholesterol, Total: 122 mg/dL (ref 100–199)
HDL: 54 mg/dL (ref >39)
LDL Chol Calc (NIH): 50 mg/dL (ref 0–99)
Triglycerides: 97 mg/dL (ref 0–149)
VLDL Cholesterol Cal: 18 mg/dL (ref 5–40)

## 2023-01-24 DIAGNOSIS — H401131 Primary open-angle glaucoma, bilateral, mild stage: Secondary | ICD-10-CM | POA: Diagnosis not present

## 2023-03-19 DIAGNOSIS — Z23 Encounter for immunization: Secondary | ICD-10-CM | POA: Diagnosis not present

## 2023-03-30 ENCOUNTER — Encounter: Payer: Self-pay | Admitting: Nurse Practitioner

## 2023-03-30 ENCOUNTER — Ambulatory Visit (INDEPENDENT_AMBULATORY_CARE_PROVIDER_SITE_OTHER): Payer: Medicare Other | Admitting: Nurse Practitioner

## 2023-03-30 VITALS — BP 123/76 | HR 78 | Temp 97.1°F | Resp 20 | Ht 70.0 in | Wt 169.0 lb

## 2023-03-30 DIAGNOSIS — K219 Gastro-esophageal reflux disease without esophagitis: Secondary | ICD-10-CM

## 2023-03-30 DIAGNOSIS — I10 Essential (primary) hypertension: Secondary | ICD-10-CM

## 2023-03-30 DIAGNOSIS — I739 Peripheral vascular disease, unspecified: Secondary | ICD-10-CM

## 2023-03-30 DIAGNOSIS — N1832 Chronic kidney disease, stage 3b: Secondary | ICD-10-CM | POA: Diagnosis not present

## 2023-03-30 DIAGNOSIS — Z6827 Body mass index (BMI) 27.0-27.9, adult: Secondary | ICD-10-CM | POA: Diagnosis not present

## 2023-03-30 DIAGNOSIS — I482 Chronic atrial fibrillation, unspecified: Secondary | ICD-10-CM

## 2023-03-30 DIAGNOSIS — E782 Mixed hyperlipidemia: Secondary | ICD-10-CM | POA: Diagnosis not present

## 2023-03-30 DIAGNOSIS — E559 Vitamin D deficiency, unspecified: Secondary | ICD-10-CM | POA: Diagnosis not present

## 2023-03-30 MED ORDER — METOPROLOL SUCCINATE ER 100 MG PO TB24: 100.0000 mg | ORAL_TABLET | Freq: Every day | ORAL | 1 refills | Status: DC

## 2023-03-30 MED ORDER — OMEPRAZOLE 20 MG PO CPDR: 20.0000 mg | DELAYED_RELEASE_CAPSULE | Freq: Every day | ORAL | 1 refills | Status: DC

## 2023-03-30 MED ORDER — SIMVASTATIN 20 MG PO TABS: 20.0000 mg | ORAL_TABLET | Freq: Every day | ORAL | 1 refills | Status: DC

## 2023-03-30 MED ORDER — APIXABAN 2.5 MG PO TABS
ORAL_TABLET | ORAL | 1 refills | Status: DC
Start: 2023-03-30 — End: 2023-08-24

## 2023-03-30 MED ORDER — LISINOPRIL 40 MG PO TABS: 40.0000 mg | ORAL_TABLET | Freq: Every day | ORAL | 1 refills | Status: DC

## 2023-03-30 MED ORDER — HYDROCHLOROTHIAZIDE 25 MG PO TABS: 25.0000 mg | ORAL_TABLET | Freq: Every day | ORAL | 1 refills | Status: DC

## 2023-03-30 MED ORDER — CILOSTAZOL 100 MG PO TABS: 100.0000 mg | ORAL_TABLET | Freq: Two times a day (BID) | ORAL | 1 refills | Status: DC

## 2023-03-30 NOTE — Patient Instructions (Signed)
Fall Prevention in the Home, Adult Falls can cause injuries and can happen to people of all ages. There are many things you can do to make your home safer and to help prevent falls. What actions can I take to prevent falls? General information Use good lighting in all rooms. Make sure to: Replace any light bulbs that burn out. Turn on the lights in dark areas and use night-lights. Keep items that you use often in easy-to-reach places. Lower the shelves around your home if needed. Move furniture so that there are clear paths around it. Do not use throw rugs or other things on the floor that can make you trip. If any of your floors are uneven, fix them. Add color or contrast paint or tape to clearly mark and help you see: Grab bars or handrails. First and last steps of staircases. Where the edge of each step is. If you use a ladder or stepladder: Make sure that it is fully opened. Do not climb a closed ladder. Make sure the sides of the ladder are locked in place. Have someone hold the ladder while you use it. Know where your pets are as you move through your home. What can I do in the bathroom?     Keep the floor dry. Clean up any water on the floor right away. Remove soap buildup in the bathtub or shower. Buildup makes bathtubs and showers slippery. Use non-skid mats or decals on the floor of the bathtub or shower. Attach bath mats securely with double-sided, non-slip rug tape. If you need to sit down in the shower, use a non-slip stool. Install grab bars by the toilet and in the bathtub and shower. Do not use towel bars as grab bars. What can I do in the bedroom? Make sure that you have a light by your bed that is easy to reach. Do not use any sheets or blankets on your bed that hang to the floor. Have a firm chair or bench with side arms that you can use for support when you get dressed. What can I do in the kitchen? Clean up any spills right away. If you need to reach something  above you, use a step stool with a grab bar. Keep electrical cords out of the way. Do not use floor polish or wax that makes floors slippery. What can I do with my stairs? Do not leave anything on the stairs. Make sure that you have a light switch at the top and the bottom of the stairs. Make sure that there are handrails on both sides of the stairs. Fix handrails that are broken or loose. Install non-slip stair treads on all your stairs if they do not have carpet. Avoid having throw rugs at the top or bottom of the stairs. Choose a carpet that does not hide the edge of the steps on the stairs. Make sure that the carpet is firmly attached to the stairs. Fix carpet that is loose or worn. What can I do on the outside of my home? Use bright outdoor lighting. Fix the edges of walkways and driveways and fix any cracks. Clear paths of anything that can make you trip, such as tools or rocks. Add color or contrast paint or tape to clearly mark and help you see anything that might make you trip as you walk through a door, such as a raised step or threshold. Trim any bushes or trees on paths to your home. Check to see if handrails are loose   or broken and that both sides of all steps have handrails. Install guardrails along the edges of any raised decks and porches. Have leaves, snow, or ice cleared regularly. Use sand, salt, or ice melter on paths if you live where there is ice and snow during the winter. Clean up any spills in your garage right away. This includes grease or oil spills. What other actions can I take? Review your medicines with your doctor. Some medicines can cause dizziness or changes in blood pressure, which increase your risk of falling. Wear shoes that: Have a low heel. Do not wear high heels. Have rubber bottoms and are closed at the toe. Feel good on your feet and fit well. Use tools that help you move around if needed. These include: Canes. Walkers. Scooters. Crutches. Ask  your doctor what else you can do to help prevent falls. This may include seeing a physical therapist to learn to do exercises to move better and get stronger. Where to find more information Centers for Disease Control and Prevention, [employer]: cdc.gov National Institute on Aging: nia.nih.gov National Institute on Aging: nia.nih.gov Contact a doctor if: You are afraid of falling at home. You feel weak, drowsy, or dizzy at home. You fall at home. Get help right away if you: Lose consciousness or have trouble moving after a fall. Have a fall that causes a head injury. These symptoms may be an emergency. Get help right away. Call 911. Do not wait to see if the symptoms will go away. Do not drive yourself to the hospital. This information is not intended to replace advice given to you by your health care provider. Make sure you discuss any questions you have with your health care provider. Document Revised: 01/23/2022 Document Reviewed: 01/23/2022 Elsevier Patient Education  2024 Elsevier Inc.  

## 2023-03-30 NOTE — Progress Notes (Signed)
Subjective:    Patient ID: Frank Cook, male    DOB: 17-Jul-1931, 89 y.o.   MRN: [id number]   Chief Complaint: Medical Management of Chronic Issues    HPI:  Frank Cook is a 89 y.o. who identifies as a male who was assigned male at birth.   Social history: Lives with: by himself-niece checks on him daily Work history: retired   Water engineer in today for follow up of the following chronic medical issues:  1. Primary hypertension No c/o chest pain, sob or headache. Does not check blood pressure at home. BP Readings from Last 3 Encounters:  03/30/23 123/76  12/12/22 135/83  06/13/22 121/81     2. Chronic atrial fibrillation (HCC) No c/o palpitations or heart racing.  3. Gastroesophageal reflux disease without esophagitis Is on omeprazole daily and is doing well  4. Mixed hyperlipidemia Eats whatever her nieces bring her to eat. Lab Results  Component Value Date   CHOL 122 12/12/2022   HDL 54 12/12/2022   LDLCALC 50 12/12/2022   TRIG 97 12/12/2022   CHOLHDL 2.3 12/12/2022     5. Stage 3b chronic kidney disease (HCC) No voiding issues Lab Results  Component Value Date   CREATININE 1.38 (H) 12/12/2022     6. Vitamin D deficiency Is on daily vitamin d supplement  7. BMI 27.0-27.9,adult No recent weight changes Wt Readings from Last 3 Encounters:  03/30/23 169 lb (76.7 kg)  12/12/22 171 lb (77.6 kg)  08/16/22 177 lb (80.3 kg)   BMI Readings from Last 3 Encounters:  03/30/23 24.25 kg/m  12/12/22 24.54 kg/m  08/16/22 25.40 kg/m      New complaints: None today  No Known Allergies Outpatient Encounter Medications as of 03/30/2023  Medication Sig   apixaban (ELIQUIS) 2.5 MG TABS tablet Take 1 tablet (2.5 mg total) by mouth 2 (two) times daily.   Cholecalciferol (VITAMIN D3) 5000 units CAPS Take 5,000 Units by mouth daily.   cilostazol (PLETAL) 100 MG tablet Take 1 tablet (100 mg total) by mouth 2 (two) times daily.   Ferrous Sulfate (IRON PO) Take  by mouth.   hydrochlorothiazide (HYDRODIURIL) 25 MG tablet Take 1 tablet (25 mg total) by mouth daily.   latanoprost (XALATAN) 0.005 % ophthalmic solution Place 1 drop into both eyes at bedtime.   lisinopril (ZESTRIL) 40 MG tablet Take 1 tablet (40 mg total) by mouth daily.   metoprolol succinate (TOPROL-XL) 100 MG 24 hr tablet Take 1 tablet (100 mg total) by mouth daily. with food   Omega 3 1000 MG CAPS Take 2,000 mg by mouth daily.   omeprazole (PRILOSEC) 20 MG capsule Take 1 capsule (20 mg total) by mouth daily.   Simethicone (GAS-X PO) Take by mouth.   simvastatin (ZOCOR) 20 MG tablet Take 1 tablet (20 mg total) by mouth daily.   No facility-administered encounter medications on file as of 03/30/2023.    Past Surgical History:  Procedure Laterality Date   CATARACT EXTRACTION Bilateral    COLONOSCOPY     None      Family History  Problem Relation Age of Onset   COPD Mother    Cancer Father        Bone   Cancer Brother        Bone   Stroke Maternal Grandfather    Arthritis Brother    Hypertension Brother       Controlled substance contract: n/a     Review of Systems  Constitutional:  Negative for diaphoresis.  Eyes:  Negative for pain.  Respiratory:  Negative for shortness of breath.   Cardiovascular:  Negative for chest pain, palpitations and leg swelling.  Gastrointestinal:  Negative for abdominal pain.  Endocrine: Negative for polydipsia.  Skin:  Negative for rash.  Neurological:  Negative for dizziness, weakness and headaches.  Hematological:  Does not bruise/bleed easily.  All other systems reviewed and are negative.      Objective:   Physical Exam Vitals and nursing note reviewed.  Constitutional:      Appearance: Normal appearance. He is well-developed.  HENT:     Head: Normocephalic.     Nose: Nose normal.     Mouth/Throat:     Mouth: Mucous membranes are moist.     Pharynx: Oropharynx is clear.  Eyes:     Pupils: Pupils are equal, round, and  reactive to light.  Neck:     Thyroid: No thyroid mass or thyromegaly.     Vascular: No carotid bruit or JVD.     Trachea: Phonation normal.  Cardiovascular:     Rate and Rhythm: Normal rate. Rhythm irregular.  Pulmonary:     Effort: Pulmonary effort is normal. No respiratory distress.     Breath sounds: Normal breath sounds.  Abdominal:     General: Bowel sounds are normal.     Palpations: Abdomen is soft.     Tenderness: There is no abdominal tenderness.  Musculoskeletal:        General: Normal range of motion.     Cervical back: Normal range of motion and neck supple.     Comments: Slow and steady with cane  Lymphadenopathy:     Cervical: No cervical adenopathy.  Skin:    General: Skin is warm and dry.  Neurological:     Mental Status: He is alert and oriented to person, place, and time.  Psychiatric:        Behavior: Behavior normal.        Thought Content: Thought content normal.        Judgment: Judgment normal.    BP 123/76   Pulse 78   Temp (!) 97.1 F (36.2 C) (Temporal)   Resp 20   Ht 5\' 10"  (1.778 m)   Wt 169 lb (76.7 kg)   SpO2 98%   BMI 24.25 kg/m       Assessment & Plan:   Frank Cook comes in today with chief complaint of Medical Management of Chronic Issues   Diagnosis and orders addressed:  1. Primary hypertension Low sodium diet - hydrochlorothiazide (HYDRODIURIL) 25 MG tablet; Take 1 tablet (25 mg total) by mouth daily.  Dispense: 90 tablet; Refill: 1 - lisinopril (ZESTRIL) 40 MG tablet; Take 1 tablet (40 mg total) by mouth daily.  Dispense: 90 tablet; Refill: 1 - metoprolol succinate (TOPROL-XL) 100 MG 24 hr tablet; Take 1 tablet (100 mg total) by mouth daily. with food  Dispense: 90 tablet; Refill: 1 - CBC with Differential/Platelet - CMP14+EGFR  2. Chronic atrial fibrillation (HCC) Avoid caffeine - apixaban (ELIQUIS) 2.5 MG TABS tablet; Take 1 tablet (2.5 mg total) by mouth 2 (two) times daily.  Dispense: 180 tablet; Refill: 1  3.  Gastroesophageal reflux disease without esophagitis Avoid spicy foods Do not eat 2 hours prior to bedtime - omeprazole (PRILOSEC) 20 MG capsule; Take 1 capsule (20 mg total) by mouth daily.  Dispense: 90 capsule; Refill: 1  4. Mixed hyperlipidemia Low fat diet - simvastatin (ZOCOR) 20 MG tablet;  Take 1 tablet (20 mg total) by mouth daily.  Dispense: 90 tablet; Refill: 1 - Lipid panel  5. Stage 3b chronic kidney disease (HCC) Labs pending  6. Vitamin D deficiency Continue daily vitamin d supplement  7. BMI 27.0-27.9,adult Discussed diet and exercise for person with BMI >25 Will recheck weight in 3-6 months   8. PVD (peripheral vascular disease) (HCC) - cilostazol (PLETAL) 100 MG tablet; Take 1 tablet (100 mg total) by mouth 2 (two) times daily.  Dispense: 180 tablet; Refill: 1   Labs pending Health Maintenance reviewed Diet and exercise encouraged  Follow up plan: 6 months   Mary-Margaret Daphine Deutscher, FNP

## 2023-03-31 LAB — CMP14+EGFR
ALT: 11 [IU]/L (ref 0–44)
AST: 18 [IU]/L (ref 0–40)
Albumin: 4.3 g/dL (ref 3.7–4.7)
Alkaline Phosphatase: 41 [IU]/L — ABNORMAL LOW (ref 44–121)
BUN/Creatinine Ratio: 14 (ref 10–24)
BUN: 23 mg/dL (ref 8–27)
Bilirubin Total: 0.5 mg/dL (ref 0.0–1.2)
CO2: 24 mmol/L (ref 20–29)
Calcium: 9.9 mg/dL (ref 8.6–10.2)
Chloride: 102 mmol/L (ref 96–106)
Creatinine, Ser: 1.6 mg/dL — ABNORMAL HIGH (ref 0.76–1.27)
Globulin, Total: 2.9 g/dL (ref 1.5–4.5)
Glucose: 92 mg/dL (ref 70–99)
Potassium: 4.1 mmol/L (ref 3.5–5.2)
Sodium: 142 mmol/L (ref 134–144)
Total Protein: 7.2 g/dL (ref 6.0–8.5)
eGFR: 41 mL/min/{1.73_m2} — ABNORMAL LOW (ref >59)

## 2023-03-31 LAB — CBC WITH DIFFERENTIAL/PLATELET
Basophils Absolute: 0 10*3/uL (ref 0.0–0.2)
Basos: 1 %
EOS (ABSOLUTE): 0.1 10*3/uL (ref 0.0–0.4)
Eos: 2 %
Hematocrit: 40.4 % (ref 37.5–51.0)
Hemoglobin: 12.8 g/dL — ABNORMAL LOW (ref 13.0–17.7)
Immature Grans (Abs): 0 10*3/uL (ref 0.0–0.1)
Immature Granulocytes: 0 %
Lymphocytes Absolute: 1.5 10*3/uL (ref 0.7–3.1)
Lymphs: 26 %
MCH: 30.5 pg (ref 26.6–33.0)
MCHC: 31.7 g/dL (ref 31.5–35.7)
MCV: 96 fL (ref 79–97)
Monocytes Absolute: 0.4 10*3/uL (ref 0.1–0.9)
Monocytes: 7 %
Neutrophils Absolute: 3.7 10*3/uL (ref 1.4–7.0)
Neutrophils: 64 %
Platelets: 199 10*3/uL (ref 150–450)
RBC: 4.19 x10E6/uL (ref 4.14–5.80)
RDW: 13.1 % (ref 11.6–15.4)
WBC: 5.7 10*3/uL (ref 3.4–10.8)

## 2023-03-31 LAB — LIPID PANEL
Chol/HDL Ratio: 2.2 ratio (ref 0.0–5.0)
Cholesterol, Total: 121 mg/dL (ref 100–199)
HDL: 55 mg/dL (ref >39)
LDL Chol Calc (NIH): 49 mg/dL (ref 0–99)
Triglycerides: 89 mg/dL (ref 0–149)
VLDL Cholesterol Cal: 17 mg/dL (ref 5–40)

## 2023-04-03 ENCOUNTER — Telehealth: Payer: Self-pay | Admitting: Nurse Practitioner

## 2023-04-03 NOTE — Telephone Encounter (Signed)
Called and discussed lab results with niece. Niece verbalized understanding

## 2023-04-08 NOTE — Progress Notes (Unsigned)
  Cardiology Office Note:   Date:  04/11/2023  ID:  Frank Cook, DOB 24-Jan-1932, MRN [id number] PCP: Bennie Pierini, FNP  [facility] Providers Cardiologist:  None {  History of Present Illness:   Frank Cook is a 89 y.o. male  who presents for follow-up of atrial fibrillation.  Since I last saw him he has done okay.  The patient denies any new symptoms such as chest discomfort, neck or arm discomfort. There has been no new shortness of breath, PND or orthopnea. There have been no reported palpitations, presyncope or syncope.  He does not notice his atrial fibrillation.  He still lives by himself.  He uses a cane.  He is careful not to fall.   ROS: As stated in the HPI and negative for all other systems.  Studies Reviewed:    EKG:   EKG Interpretation Date/Time:  Wednesday April 11 2023 15:40:06 EST Ventricular Rate:  87 PR Interval:    QRS Duration:  148 QT Interval:  382 QTC Calculation: 459 R Axis:   72  Text Interpretation: Atrial fibrillation Right bundle branch block No significant change since last tracing Confirmed by Rollene Rotunda ([id number]) on 04/11/2023 4:16:27 PM     Risk Assessment/Calculations:    CHA2DS2-VASc Score = 3   This indicates a 3.2% annual risk of stroke. The patient's score is based upon: CHF History: 0 HTN History: 1 Diabetes History: 0 Stroke History: 0 Vascular Disease History: 0 Age Score: 2 Gender Score: 0       Physical Exam:   VS:  BP 120/80   Pulse 87   Ht 5\' 10"  (1.778 m)   Wt 169 lb (76.7 kg)   BMI 24.25 kg/m    Wt Readings from Last 3 Encounters:  04/11/23 169 lb (76.7 kg)  03/30/23 169 lb (76.7 kg)  12/12/22 171 lb (77.6 kg)     GEN: Well nourished, well developed in no acute distress NECK: No JVD; No carotid bruits CARDIAC: IrrebularRR, no murmurs, rubs, gallops RESPIRATORY:  Clear to auscultation without rales, wheezing or rhonchi  ABDOMEN: Soft, non-tender, non-distended EXTREMITIES:  No edema; No  deformity   ASSESSMENT AND PLAN:   ATRIAL FIB:  Mr. Frank Cook has a CHA2DS2 -he tolerates anticoagulation.  He has had good rate control.  He is on the appropriate dose.  No change in therapy.   HTN: His blood pressure is controlled.  No change in therapy.    CKD: His creatinine is stable at 1.6.  No change in therapy.  He is up-to-date with his blood work.  Follow up with me in 1 year  Signed, Rollene Rotunda, MD

## 2023-04-11 ENCOUNTER — Encounter: Payer: Self-pay | Admitting: Cardiology

## 2023-04-11 ENCOUNTER — Ambulatory Visit (INDEPENDENT_AMBULATORY_CARE_PROVIDER_SITE_OTHER): Payer: Medicare Other | Admitting: Cardiology

## 2023-04-11 VITALS — BP 120/80 | HR 87 | Ht 70.0 in | Wt 169.0 lb

## 2023-04-11 DIAGNOSIS — I10 Essential (primary) hypertension: Secondary | ICD-10-CM

## 2023-04-11 DIAGNOSIS — N1832 Chronic kidney disease, stage 3b: Secondary | ICD-10-CM | POA: Diagnosis not present

## 2023-04-11 DIAGNOSIS — I482 Chronic atrial fibrillation, unspecified: Secondary | ICD-10-CM

## 2023-04-11 NOTE — Patient Instructions (Signed)

## 2023-08-17 ENCOUNTER — Ambulatory Visit: Payer: Medicare Other

## 2023-08-17 VITALS — Ht 70.0 in | Wt 169.0 lb

## 2023-08-17 DIAGNOSIS — Z0000 Encounter for general adult medical examination without abnormal findings: Secondary | ICD-10-CM | POA: Diagnosis not present

## 2023-08-17 NOTE — Patient Instructions (Signed)
 Mr. Groesbeck , Thank you for taking time to come for your Medicare Wellness Visit. I appreciate your ongoing commitment to your health goals. Please review the following plan we discussed and let me know if I can assist you in the future.   Referrals/Orders/Follow-Ups/Clinician Recommendations: Aim for 30 minutes of exercise or brisk walking, 6-8 glasses of water, and 5 servings of fruits and vegetables each day.  This is a list of the screening recommended for you and due dates:  Health Maintenance  Topic Date Due   COVID-19 Vaccine (7 - 2024-25 season) 02/04/2023   Medicare Annual Wellness Visit  08/16/2024   DTaP/Tdap/Td vaccine (3 - Td or Tdap) 06/18/2031   Pneumonia Vaccine  Completed   Flu Shot  Completed   Zoster (Shingles) Vaccine  Completed   HPV Vaccine  Aged Out    Advanced directives: (In Chart) A copy of your advanced directives are scanned into your chart should your provider ever need it.  Next Medicare Annual Wellness Visit scheduled for next year: Yes

## 2023-08-17 NOTE — Progress Notes (Signed)
 Subjective:   Frank Cook is a 89 y.o. who presents for a Medicare Wellness preventive visit.  Visit Complete: Virtual I connected with  Inocencio Roy Heiss on 08/17/23 by a audio enabled telemedicine application and verified that I am speaking with the correct person using two identifiers.  Patient Location: Home  Provider Location: Home Office  I discussed the limitations of evaluation and management by telemedicine. The patient expressed understanding and agreed to proceed.  Vital Signs: Because this visit was a virtual/telehealth visit, some criteria may be missing or patient reported. Any vitals not documented were not able to be obtained and vitals that have been documented are patient reported.  VideoDeclined- This patient declined Librarian, academic. Therefore the visit was completed with audio only.  Persons Participating in Visit: Patient.  AWV Questionnaire: Yes: Patient Medicare AWV questionnaire was completed by the patient on 08/16/23; I have confirmed that all information answered by patient is correct and no changes since this date.  Cardiac Risk Factors include: advanced age (>69men, >15 women);male gender;hypertension;dyslipidemia     Objective:    Today's Vitals   08/17/23 1136  Weight: 169 lb (76.7 kg)  Height: 5\' 10"  (1.778 m)   Body mass index is 24.25 kg/m.     08/17/2023   11:52 AM 08/16/2022   10:27 AM 08/12/2021   10:47 AM 08/11/2020   10:38 AM 08/04/2019   10:22 AM 07/31/2018   11:34 AM 07/26/2017   11:24 AM  Advanced Directives  Does Patient Have a Medical Advance Directive? No Yes Yes Yes Yes Yes Yes  Type of Furniture conservator/restorer;Living will Healthcare Power of [place];Living will Living will;Healthcare Power of [employer] Power of [place];Living will Living will Healthcare Power of [place];Living will  Does patient want to make changes to medical advance directive?  No - Patient declined   No - Patient declined  No - Patient declined   Copy of Healthcare Power of Attorney in Chart?  Yes - validated most recent copy scanned in chart (See row information) Yes - validated most recent copy scanned in chart (See row information) No - copy requested Yes - validated most recent copy scanned in chart (See row information)  Yes  Would patient like information on creating a medical advance directive? Yes (MAU/Ambulatory/Procedural Areas - Information given)          Current Medications (verified) Outpatient Encounter Medications as of 08/17/2023  Medication Sig   apixaban (ELIQUIS) 2.5 MG TABS tablet Take 1 tablet (2.5 mg total) by mouth 2 (two) times daily.   Cholecalciferol (VITAMIN D3) 5000 units CAPS Take 5,000 Units by mouth daily.   cilostazol (PLETAL) 100 MG tablet Take 1 tablet (100 mg total) by mouth 2 (two) times daily.   Ferrous Sulfate (IRON PO) Take by mouth.   hydrochlorothiazide (HYDRODIURIL) 25 MG tablet Take 1 tablet (25 mg total) by mouth daily.   latanoprost (XALATAN) 0.005 % ophthalmic solution Place 1 drop into both eyes at bedtime.   lisinopril (ZESTRIL) 40 MG tablet Take 1 tablet (40 mg total) by mouth daily.   metoprolol succinate (TOPROL-XL) 100 MG 24 hr tablet Take 1 tablet (100 mg total) by mouth daily. with food   Omega 3 1000 MG CAPS Take 2,000 mg by mouth daily.   omeprazole (PRILOSEC) 20 MG capsule Take 1 capsule (20 mg total) by mouth daily.   Simethicone (GAS-X PO) Take by mouth.   simvastatin (ZOCOR) 20 MG tablet  Take 1 tablet (20 mg total) by mouth daily.   No facility-administered encounter medications on file as of 08/17/2023.    Allergies (verified) Patient has no known allergies.   History: Past Medical History:  Diagnosis Date   Atrial fibrillation (HCC)    Colon polyps    GERD (gastroesophageal reflux disease)    Hyperlipidemia    Hypertension    Osteopenia    Past Surgical History:  Procedure Laterality Date   CATARACT EXTRACTION  Bilateral    COLONOSCOPY     None     Family History  Problem Relation Age of Onset   COPD Mother    Cancer Father        Bone   Cancer Brother        Bone   Stroke Maternal Grandfather    Arthritis Brother    Hypertension Brother    Social History   Socioeconomic History   Marital status: Married    Spouse name: Not on file   Number of children: 0   Years of education: Not on file   Highest education level: 12th grade  Occupational History   Occupation: Retired    Associate Professor: [employer]  Tobacco Use   Smoking status: Never   Smokeless tobacco: Never   Tobacco comments:    Never smoker   Vaping Use   Vaping status: Never Used  Substance and Sexual Activity   Alcohol use: No   Drug use: No   Sexual activity: Not on file  Other Topics Concern   Not on file  Social History Narrative   Lives alone.  Wife is now in nursing home.   His niece and nephew check on him frequently; he has a close friend that helps him around the house   Niece drives him out of town and helps with shopping   Social Drivers of Health   Financial Resource Strain: Low Risk  (08/17/2023)   Overall Financial Resource Strain (CARDIA)    Difficulty of Paying Living Expenses: Not hard at all  Food Insecurity: No Food Insecurity (08/17/2023)   Hunger Vital Sign    Worried About Running Out of Food in the Last Year: Never true    Ran Out of Food in the Last Year: Never true  Transportation Needs: No Transportation Needs (08/17/2023)   PRAPARE - Administrator, Civil Service (Medical): No    Lack of Transportation (Non-Medical): No  Physical Activity: Inactive (03/28/2023)   Exercise Vital Sign    Days of Exercise per Week: 0 days    Minutes of Exercise per Session: 30 min  Stress: Stress Concern Present (08/17/2023)   [employer] of Occupational Health - Occupational Stress Questionnaire    Feeling of Stress : To some extent  Social Connections: Moderately Isolated (08/17/2023)    Social Connection and Isolation Panel [NHANES]    Frequency of Communication with Friends and Family: More than three times a week    Frequency of Social Gatherings with Friends and Family: Three times a week    Attends Religious Services: Never    Active Member of Clubs or Organizations: No    Attends Banker Meetings: Never    Marital Status: Married    Tobacco Counseling Counseling given: Not Answered Tobacco comments: Never smoker     Clinical Intake:  Pre-visit preparation completed: Yes  Pain : No/denies pain     Diabetes: No  How often do you need to have someone help you when you  read instructions, pamphlets, or other written materials from your doctor or pharmacy?: 1 - Never  Interpreter Needed?: No  Information entered by :: Kandis Fantasia LPN   Activities of Daily Living     08/16/2023    4:55 PM  In your present state of health, do you have any difficulty performing the following activities:  Hearing? 0   Vision? 0   Difficulty concentrating or making decisions? 1   Walking or climbing stairs? 0   Dressing or bathing? 0   Doing errands, shopping? 1   Preparing Food and eating ? N   Using the Toilet? N   In the past six months, have you accidently leaked urine? N   Do you have problems with loss of bowel control? Y   Managing your Medications? N   Managing your Finances? Y   Housekeeping or managing your Housekeeping? Y      Proxy-reported    Patient Care Team: Bennie Pierini, FNP as PCP - General (Nurse Practitioner) Rollene Rotunda, MD as Consulting Physician (Cardiology) Jill Side, OD as Referring Physician (Optometry) Cresenciano Genre Lilla Shook, [facility] as Pharmacist (Family Medicine)  Indicate any recent Medical Services you may have received from other than [facility] providers in the past year (date may be approximate).     Assessment:   This is a routine wellness examination for Abdelaziz.  Hearing/Vision screen Hearing Screening -  Comments:: Denies hearing difficulties   Vision Screening - Comments:: Wears rx glasses - up to date with routine eye exams with Dr. Corky Mull    Goals Addressed             This Visit's Progress    COMPLETED: awv       08/04/2019 AWV Goal: Exercise for General Health  Patient will verbalize understanding of the benefits of increased physical activity: Exercising regularly is important. It will improve your overall fitness, flexibility, and endurance. Regular exercise also will improve your overall health. It can help you control your weight, reduce stress, and improve your bone density. Over the next year, patient will increase physical activity as tolerated with a goal of at least 150 minutes of moderate physical activity per week.  You can tell that you are exercising at a moderate intensity if your heart starts beating faster and you start breathing faster but can still hold a conversation. Moderate-intensity exercise ideas include: Walking 1 mile (1.6 km) in about 15 minutes Biking Hiking Golfing Dancing Water aerobics Patient will verbalize understanding of everyday activities that increase physical activity by providing examples like the following: Yard work, such as: Insurance underwriter Gardening Washing windows or floors Patient will be able to explain general safety guidelines for exercising:  Before you start a new exercise program, talk with your health care provider. Do not exercise so much that you hurt yourself, feel dizzy, or get very short of breath. Wear comfortable clothes and wear shoes with good support. Drink plenty of water while you exercise to prevent dehydration or heat stroke. Work out until your breathing and your heartbeat get faster.      Patient Stated   On track    08/11/2020 AWV Goal: Fall Prevention  Over the next year, patient will decrease their risk for  falls by: Using assistive devices, such as a cane or walker, as needed Identifying fall risks within their home and correcting them by: Removing throw rugs Adding handrails to  stairs or ramps Removing clutter and keeping a clear pathway throughout the home Increasing light, especially at night Adding shower handles/bars Raising toilet seat Identifying potential personal risk factors for falls: Medication side effects Incontinence/urgency Vestibular dysfunction Hearing loss Musculoskeletal disorders Neurological disorders Orthostatic hypotension         Depression Screen     08/17/2023   11:49 AM 03/30/2023   11:09 AM 12/12/2022   11:38 AM 08/16/2022   10:26 AM 06/13/2022   10:53 AM 03/06/2022    3:41 PM 12/13/2021   11:36 AM  PHQ 2/9 Scores  PHQ - 2 Score 3 3 4  0 1 2 2   PHQ- 9 Score 8 8 17  4 14 12     Fall Risk     08/16/2023    4:55 PM 03/30/2023   11:09 AM 12/12/2022   11:38 AM 08/16/2022   10:25 AM 06/13/2022   10:53 AM  Fall Risk   Falls in the past year? 0  0 0 0 1  Number falls in past yr: 0    0 0  Injury with Fall? 0    0 0  Risk for fall due to : Impaired mobility;Impaired balance/gait   No Fall Risks History of fall(s)  Follow up Education provided;Falls prevention discussed;Falls evaluation completed   Falls prevention discussed Education provided     Proxy-reported    MEDICARE RISK AT HOME:  Medicare Risk at Home Any stairs in or around the home?: (Proxy-Rptd) Yes If so, are there any without handrails?: (Proxy-Rptd) No Home free of loose throw rugs in walkways, pet beds, electrical cords, etc?: (Proxy-Rptd) Yes Adequate lighting in your home to reduce risk of falls?: (Proxy-Rptd) Yes Life alert?: (Proxy-Rptd) Yes Use of a cane, walker or w/c?: (Proxy-Rptd) Yes Grab bars in the bathroom?: (Proxy-Rptd) Yes Shower chair or bench in shower?: (Proxy-Rptd) Yes Elevated toilet seat or a handicapped toilet?: (Proxy-Rptd) Yes  TIMED UP AND GO:  Was the test  performed?  No  Cognitive Function: 6CIT completed    07/31/2018   11:42 AM 07/26/2017   11:27 AM 07/19/2016    4:13 PM  MMSE - Mini Mental State Exam  Orientation to time 5 5 5   Orientation to Place 5 5 5   Registration 3 3 3   Attention/ Calculation 5 5 4   Recall 2 3 1   Language- name 2 objects 2 2 2   Language- repeat 1 1 1   Language- follow 3 step command 3 3 3   Language- read & follow direction 1 1 1   Write a sentence 1 1 1   Copy design 1 1 1   Total score 29 30 27         08/17/2023   11:53 AM 08/16/2022   10:28 AM 08/12/2021   10:43 AM 08/11/2020   10:40 AM 08/04/2019   10:29 AM  6CIT Screen  What Year? 0 points 0 points 0 points 0 points 0 points  What month? 0 points 0 points 0 points 0 points 0 points  What time? 0 points 0 points 0 points 0 points 0 points  Count back from 20 0 points 0 points 0 points 0 points 0 points  Months in reverse 2 points 0 points 0 points 0 points 0 points  Repeat phrase 2 points 2 points 2 points 6 points 2 points  Total Score 4 points 2 points 2 points 6 points 2 points    Immunizations Immunization History  Administered Date(s) Administered   Fluad Quad(high Dose 65+) 03/05/2020, 04/05/2021,  02/28/2022, 02/28/2022, 02/28/2022   Fluad Trivalent(High Dose 65+) 03/19/2023   Influenza, High Dose Seasonal PF 04/03/2016, 04/02/2017   Influenza,inj,Quad PF,6+ Mos 02/24/2013, 03/16/2014, 03/29/2015, 03/18/2019   Influenza,inj,quad, With Preservative 03/18/2019   Influenza-Unspecified 03/13/2018, 03/04/2021   Moderna Covid-19 Vaccine Bivalent Booster 30yrs & up 04/05/2021   Moderna SARS-COV2 Booster Vaccination 03/31/2020, 10/26/2020   Moderna Sars-Covid-2 Vaccination 07/01/2019, 07/29/2019   Pfizer(Comirnaty)Fall Seasonal Vaccine 12 years and older 03/14/2022   Pneumococcal Conjugate-13 09/17/2014   Pneumococcal Polysaccharide-23 06/06/1999   Tdap 03/06/2011, 06/17/2021   Zoster Recombinant(Shingrix) 08/12/2018, 11/12/2018   Zoster, Live  04/06/2007    Screening Tests Health Maintenance  Topic Date Due   COVID-19 Vaccine (7 - 2024-25 season) 02/04/2023   Medicare Annual Wellness (AWV)  08/16/2024   DTaP/Tdap/Td (3 - Td or Tdap) 06/18/2031   Pneumonia Vaccine 13+ Years old  Completed   INFLUENZA VACCINE  Completed   Zoster Vaccines- Shingrix  Completed   HPV VACCINES  Aged Out    Health Maintenance  Health Maintenance Due  Topic Date Due   COVID-19 Vaccine (7 - 2024-25 season) 02/04/2023   Additional Screening:  Vision Screening: Recommended annual ophthalmology exams for early detection of glaucoma and other disorders of the eye.  Dental Screening: Recommended annual dental exams for proper oral hygiene  Community Resource Referral / Chronic Care Management: CRR required this visit?  No   CCM required this visit?  No     Plan:     I have personally reviewed and noted the following in the patient's chart:   Medical and social history Use of alcohol, tobacco or illicit drugs  Current medications and supplements including opioid prescriptions. Patient is not currently taking opioid prescriptions. Functional ability and status Nutritional status Physical activity Advanced directives List of other physicians Hospitalizations, surgeries, and ER visits in previous 12 months Vitals Screenings to include cognitive, depression, and falls Referrals and appointments  In addition, I have reviewed and discussed with patient certain preventive protocols, quality metrics, and best practice recommendations. A written personalized care plan for preventive services as well as general preventive health recommendations were provided to patient.     Kandis Fantasia [place], [place]   06/23/1476   After Visit Summary: (MyChart) Due to this being a telephonic visit, the after visit summary with patients personalized plan was offered to patient via MyChart   Notes: Please refer to Routing Comments.

## 2023-08-24 ENCOUNTER — Other Ambulatory Visit: Payer: Self-pay | Admitting: Nurse Practitioner

## 2023-08-24 DIAGNOSIS — I482 Chronic atrial fibrillation, unspecified: Secondary | ICD-10-CM

## 2023-10-02 ENCOUNTER — Ambulatory Visit (INDEPENDENT_AMBULATORY_CARE_PROVIDER_SITE_OTHER)

## 2023-10-02 ENCOUNTER — Encounter: Payer: Self-pay | Admitting: Nurse Practitioner

## 2023-10-02 ENCOUNTER — Ambulatory Visit (INDEPENDENT_AMBULATORY_CARE_PROVIDER_SITE_OTHER): Payer: BLUE CROSS/BLUE SHIELD | Admitting: Nurse Practitioner

## 2023-10-02 VITALS — BP 128/80 | HR 82 | Temp 97.2°F | Ht 70.0 in | Wt 160.0 lb

## 2023-10-02 DIAGNOSIS — K591 Functional diarrhea: Secondary | ICD-10-CM | POA: Diagnosis not present

## 2023-10-02 DIAGNOSIS — I10 Essential (primary) hypertension: Secondary | ICD-10-CM | POA: Diagnosis not present

## 2023-10-02 DIAGNOSIS — Z6822 Body mass index (BMI) 22.0-22.9, adult: Secondary | ICD-10-CM

## 2023-10-02 DIAGNOSIS — K219 Gastro-esophageal reflux disease without esophagitis: Secondary | ICD-10-CM

## 2023-10-02 DIAGNOSIS — R197 Diarrhea, unspecified: Secondary | ICD-10-CM | POA: Diagnosis not present

## 2023-10-02 DIAGNOSIS — I739 Peripheral vascular disease, unspecified: Secondary | ICD-10-CM

## 2023-10-02 DIAGNOSIS — I482 Chronic atrial fibrillation, unspecified: Secondary | ICD-10-CM | POA: Diagnosis not present

## 2023-10-02 DIAGNOSIS — E782 Mixed hyperlipidemia: Secondary | ICD-10-CM | POA: Diagnosis not present

## 2023-10-02 MED ORDER — SIMVASTATIN 20 MG PO TABS: 20.0000 mg | ORAL_TABLET | Freq: Every day | ORAL | 1 refills | Status: AC

## 2023-10-02 MED ORDER — METOPROLOL SUCCINATE ER 100 MG PO TB24: 100.0000 mg | ORAL_TABLET | Freq: Every day | ORAL | 1 refills | Status: DC

## 2023-10-02 MED ORDER — LISINOPRIL 40 MG PO TABS: 40.0000 mg | ORAL_TABLET | Freq: Every day | ORAL | 1 refills | Status: DC

## 2023-10-02 MED ORDER — CILOSTAZOL 100 MG PO TABS: 100.0000 mg | ORAL_TABLET | Freq: Two times a day (BID) | ORAL | 1 refills | Status: DC

## 2023-10-02 MED ORDER — HYDROCHLOROTHIAZIDE 25 MG PO TABS: 25.0000 mg | ORAL_TABLET | Freq: Every day | ORAL | 1 refills | Status: DC

## 2023-10-02 MED ORDER — OMEPRAZOLE 20 MG PO CPDR: 20.0000 mg | DELAYED_RELEASE_CAPSULE | Freq: Every day | ORAL | 1 refills | Status: DC

## 2023-10-02 MED ORDER — APIXABAN 2.5 MG PO TABS: 2.5000 mg | ORAL_TABLET | Freq: Two times a day (BID) | ORAL | 1 refills | Status: DC

## 2023-10-02 NOTE — Progress Notes (Signed)
 Subjective:    Patient ID: Frank Cook, male    DOB: Nov 01, 1931, 89 y.o.   MRN: [id number]   Chief Complaint: medical management of chronic issues     HPI:  Frank Cook is a 89 y.o. who identifies as a male who was assigned male at birth.   Social history: Lives with: by himself-niece checks on him daily Work history: retired   Water engineer in today for follow up of the following chronic medical issues:  1. Primary hypertension No c/o chest pain, sob or headache. Does not check blood pressure at home. BP Readings from Last 3 Encounters:  04/11/23 120/80  03/30/23 123/76  12/12/22 135/83     2. Chronic atrial fibrillation (HCC) No c/o palpitations or heart racing.  3. Gastroesophageal reflux disease without esophagitis Is on omeprazole  daily and is doing well  4. Mixed hyperlipidemia Eats whatever her nieces bring her to eat. Lab Results  Component Value Date   CHOL 121 03/30/2023   HDL 55 03/30/2023   LDLCALC 49 03/30/2023   TRIG 89 03/30/2023   CHOLHDL 2.2 03/30/2023     5. Stage 3b chronic kidney disease (HCC) No voiding issues Lab Results  Component Value Date   CREATININE 1.60 (H) 03/30/2023     6. Vitamin D deficiency Is on daily vitamin d supplement  7. BMI 24.0-24.9,adult Weight is down 9lbs   Wt Readings from Last 3 Encounters:  10/02/23 160 lb (72.6 kg)  08/17/23 169 lb (76.7 kg)  04/11/23 169 lb (76.7 kg)   BMI Readings from Last 3 Encounters:  10/02/23 22.96 kg/m  08/17/23 24.25 kg/m  04/11/23 24.25 kg/m       New complaints: None today  No Known Allergies Outpatient Encounter Medications as of 10/02/2023  Medication Sig   apixaban  (ELIQUIS ) 2.5 MG TABS tablet TAKE ONE TABLET TWICE DAILY   Cholecalciferol (VITAMIN D3) 5000 units CAPS Take 5,000 Units by mouth daily.   cilostazol  (PLETAL ) 100 MG tablet Take 1 tablet (100 mg total) by mouth 2 (two) times daily.   Ferrous Sulfate (IRON PO) Take by mouth.    hydrochlorothiazide  (HYDRODIURIL ) 25 MG tablet Take 1 tablet (25 mg total) by mouth daily.   latanoprost (XALATAN) 0.005 % ophthalmic solution Place 1 drop into both eyes at bedtime.   lisinopril  (ZESTRIL ) 40 MG tablet Take 1 tablet (40 mg total) by mouth daily.   metoprolol  succinate (TOPROL -XL) 100 MG 24 hr tablet Take 1 tablet (100 mg total) by mouth daily. with food   Omega 3 1000 MG CAPS Take 2,000 mg by mouth daily.   omeprazole  (PRILOSEC) 20 MG capsule Take 1 capsule (20 mg total) by mouth daily.   Simethicone (GAS-X PO) Take by mouth.   simvastatin  (ZOCOR ) 20 MG tablet Take 1 tablet (20 mg total) by mouth daily.   No facility-administered encounter medications on file as of 10/02/2023.    Past Surgical History:  Procedure Laterality Date   CATARACT EXTRACTION Bilateral    COLONOSCOPY     None      Family History  Problem Relation Age of Onset   COPD Mother    Cancer Father        Bone   Cancer Brother        Bone   Stroke Maternal Grandfather    Arthritis Brother    Hypertension Brother       Controlled substance contract: n/a     Review of Systems  Constitutional:  Negative for diaphoresis.  Eyes:  Negative for pain.  Respiratory:  Negative for shortness of breath.   Cardiovascular:  Negative for chest pain, palpitations and leg swelling.  Gastrointestinal:  Negative for abdominal pain.  Endocrine: Negative for polydipsia.  Skin:  Negative for rash.  Neurological:  Negative for dizziness, weakness and headaches.  Hematological:  Does not bruise/bleed easily.  All other systems reviewed and are negative.      Objective:   Physical Exam Vitals and nursing note reviewed.  Constitutional:      Appearance: Normal appearance. He is well-developed.  HENT:     Head: Normocephalic.     Nose: Nose normal.     Mouth/Throat:     Mouth: Mucous membranes are moist.     Pharynx: Oropharynx is clear.  Eyes:     Pupils: Pupils are equal, round, and reactive to  light.  Neck:     Thyroid: No thyroid mass or thyromegaly.     Vascular: No carotid bruit or JVD.     Trachea: Phonation normal.  Cardiovascular:     Rate and Rhythm: Normal rate. Rhythm irregular.  Pulmonary:     Effort: Pulmonary effort is normal. No respiratory distress.     Breath sounds: Normal breath sounds.  Abdominal:     General: Bowel sounds are normal.     Palpations: Abdomen is soft.     Tenderness: There is no abdominal tenderness.  Musculoskeletal:        General: Normal range of motion.     Cervical back: Normal range of motion and neck supple.     Comments: Slow and steady with cane- appears more feeble then did at last visit  Lymphadenopathy:     Cervical: No cervical adenopathy.  Skin:    General: Skin is warm and dry.  Neurological:     Mental Status: He is alert and oriented to person, place, and time.  Psychiatric:        Behavior: Behavior normal.        Thought Content: Thought content normal.        Judgment: Judgment normal.    BP 128/80   Pulse 82   Temp (!) 97.2 F (36.2 C) (Temporal)   Ht 5\' 10"  (1.778 m)   Wt 160 lb (72.6 kg)   SpO2 98%   BMI 22.96 kg/m    KUB- Constipation-Preliminary reading by Irvine Mantis, FNP  [facility]      Assessment & Plan:   [name] comes in today with chief complaint of No chief complaint on file.   Diagnosis and orders addressed:  1. Primary hypertension Low sodium diet - hydrochlorothiazide  (HYDRODIURIL ) 25 MG tablet; Take 1 tablet (25 mg total) by mouth daily.  Dispense: 90 tablet; Refill: 1 - lisinopril  (ZESTRIL ) 40 MG tablet; Take 1 tablet (40 mg total) by mouth daily.  Dispense: 90 tablet; Refill: 1 - metoprolol  succinate (TOPROL -XL) 100 MG 24 hr tablet; Take 1 tablet (100 mg total) by mouth daily. with food  Dispense: 90 tablet; Refill: 1 - CBC with Differential/Platelet - CMP14+EGFR  2. Chronic atrial fibrillation (HCC) Avoid caffeine - apixaban  (ELIQUIS ) 2.5 MG TABS tablet; Take 1 tablet  (2.5 mg total) by mouth 2 (two) times daily.  Dispense: 180 tablet; Refill: 1  3. Gastroesophageal reflux disease without esophagitis Avoid spicy foods Do not eat 2 hours prior to bedtime - omeprazole  (PRILOSEC) 20 MG capsule; Take 1 capsule (20 mg total) by mouth daily.  Dispense: 90 capsule; Refill: 1  4.  Mixed hyperlipidemia Low fat diet - simvastatin  (ZOCOR ) 20 MG tablet; Take 1 tablet (20 mg total) by mouth daily.  Dispense: 90 tablet; Refill: 1 - Lipid panel  5. Stage 3b chronic kidney disease (HCC) Labs pending  6. Vitamin D deficiency Continue daily vitamin d supplement  7. BMI 27.0-27.9,adult Discussed diet and exercise for person with BMI >25 Will recheck weight in 3-6 months   8. PVD (peripheral vascular disease) (HCC) - cilostazol  (PLETAL ) 100 MG tablet; Take 1 tablet (100 mg total) by mouth 2 (two) times daily.  Dispense: 180 tablet; Refill: 1  9. Constipation Miralax daily Increase fiber in diet   Labs pending Health Maintenance reviewed Diet and exercise encouraged  Follow up plan: 6 months   Mary-Margaret Gaylyn Keas, FNP

## 2023-10-02 NOTE — Patient Instructions (Signed)
 Fall Prevention in the Home, Adult Falls can cause injuries and can happen to people of all ages. There are many things you can do to make your home safer and to help prevent falls. What actions can I take to prevent falls? General information Use good lighting in all rooms. Make sure to: Replace any light bulbs that burn out. Turn on the lights in dark areas and use night-lights. Keep items that you use often in easy-to-reach places. Lower the shelves around your home if needed. Move furniture so that there are clear paths around it. Do not use throw rugs or other things on the floor that can make you trip. If any of your floors are uneven, fix them. Add color or contrast paint or tape to clearly mark and help you see: Grab bars or handrails. First and last steps of staircases. Where the edge of each step is. If you use a ladder or stepladder: Make sure that it is fully opened. Do not climb a closed ladder. Make sure the sides of the ladder are locked in place. Have someone hold the ladder while you use it. Know where your pets are as you move through your home. What can I do in the bathroom?     Keep the floor dry. Clean up any water on the floor right away. Remove soap buildup in the bathtub or shower. Buildup makes bathtubs and showers slippery. Use non-skid mats or decals on the floor of the bathtub or shower. Attach bath mats securely with double-sided, non-slip rug tape. If you need to sit down in the shower, use a non-slip stool. Install grab bars by the toilet and in the bathtub and shower. Do not use towel bars as grab bars. What can I do in the bedroom? Make sure that you have a light by your bed that is easy to reach. Do not use any sheets or blankets on your bed that hang to the floor. Have a firm chair or bench with side arms that you can use for support when you get dressed. What can I do in the kitchen? Clean up any spills right away. If you need to reach something  above you, use a step stool with a grab bar. Keep electrical cords out of the way. Do not use floor polish or wax that makes floors slippery. What can I do with my stairs? Do not leave anything on the stairs. Make sure that you have a light switch at the top and the bottom of the stairs. Make sure that there are handrails on both sides of the stairs. Fix handrails that are broken or loose. Install non-slip stair treads on all your stairs if they do not have carpet. Avoid having throw rugs at the top or bottom of the stairs. Choose a carpet that does not hide the edge of the steps on the stairs. Make sure that the carpet is firmly attached to the stairs. Fix carpet that is loose or worn. What can I do on the outside of my home? Use bright outdoor lighting. Fix the edges of walkways and driveways and fix any cracks. Clear paths of anything that can make you trip, such as tools or rocks. Add color or contrast paint or tape to clearly mark and help you see anything that might make you trip as you walk through a door, such as a raised step or threshold. Trim any bushes or trees on paths to your home. Check to see if handrails are loose  or broken and that both sides of all steps have handrails. Install guardrails along the edges of any raised decks and porches. Have leaves, snow, or ice cleared regularly. Use sand, salt, or ice melter on paths if you live where there is ice and snow during the winter. Clean up any spills in your garage right away. This includes grease or oil spills. What other actions can I take? Review your medicines with your doctor. Some medicines can cause dizziness or changes in blood pressure, which increase your risk of falling. Wear shoes that: Have a low heel. Do not wear high heels. Have rubber bottoms and are closed at the toe. Feel good on your feet and fit well. Use tools that help you move around if needed. These include: Canes. Walkers. Scooters. Crutches. Ask  your doctor what else you can do to help prevent falls. This may include seeing a physical therapist to learn to do exercises to move better and get stronger. Where to find more information Centers for Disease Control and Prevention, [employer]: TonerPromos.no [employer] on Aging: BaseRingTones.pl National Institute on Aging: BaseRingTones.pl Contact a doctor if: You are afraid of falling at home. You feel weak, drowsy, or dizzy at home. You fall at home. Get help right away if you: Lose consciousness or have trouble moving after a fall. Have a fall that causes a head injury. These symptoms may be an emergency. Get help right away. Call 911. Do not wait to see if the symptoms will go away. Do not drive yourself to the hospital. This information is not intended to replace advice given to you by your health care provider. Make sure you discuss any questions you have with your health care provider. Document Revised: 01/23/2022 Document Reviewed: 01/23/2022 Elsevier Patient Education  2024 [employer].

## 2023-10-03 LAB — CBC WITH DIFFERENTIAL/PLATELET
Basophils Absolute: 0 10*3/uL (ref 0.0–0.2)
Basos: 0 %
EOS (ABSOLUTE): 0.1 10*3/uL (ref 0.0–0.4)
Eos: 2 %
Hematocrit: 37.8 % (ref 37.5–51.0)
Hemoglobin: 12.4 g/dL — ABNORMAL LOW (ref 13.0–17.7)
Immature Grans (Abs): 0 10*3/uL (ref 0.0–0.1)
Immature Granulocytes: 0 %
Lymphocytes Absolute: 1.8 10*3/uL (ref 0.7–3.1)
Lymphs: 34 %
MCH: 31.2 pg (ref 26.6–33.0)
MCHC: 32.8 g/dL (ref 31.5–35.7)
MCV: 95 fL (ref 79–97)
Monocytes Absolute: 0.4 10*3/uL (ref 0.1–0.9)
Monocytes: 7 %
Neutrophils Absolute: 2.9 10*3/uL (ref 1.4–7.0)
Neutrophils: 57 %
Platelets: 183 10*3/uL (ref 150–450)
RBC: 3.97 x10E6/uL — ABNORMAL LOW (ref 4.14–5.80)
RDW: 12.8 % (ref 11.6–15.4)
WBC: 5.2 10*3/uL (ref 3.4–10.8)

## 2023-10-03 LAB — CMP14+EGFR
ALT: 11 IU/L (ref 0–44)
AST: 18 IU/L (ref 0–40)
Albumin: 4.3 g/dL (ref 3.7–4.7)
Alkaline Phosphatase: 35 IU/L — ABNORMAL LOW (ref 44–121)
BUN/Creatinine Ratio: 17 (ref 10–24)
BUN: 24 mg/dL (ref 8–27)
Bilirubin Total: 0.3 mg/dL (ref 0.0–1.2)
CO2: 24 mmol/L (ref 20–29)
Calcium: 10.1 mg/dL (ref 8.6–10.2)
Chloride: 102 mmol/L (ref 96–106)
Creatinine, Ser: 1.42 mg/dL — ABNORMAL HIGH (ref 0.76–1.27)
Globulin, Total: 2.6 g/dL (ref 1.5–4.5)
Glucose: 107 mg/dL — ABNORMAL HIGH (ref 70–99)
Potassium: 4.3 mmol/L (ref 3.5–5.2)
Sodium: 141 mmol/L (ref 134–144)
Total Protein: 6.9 g/dL (ref 6.0–8.5)
eGFR: 47 mL/min/{1.73_m2} — ABNORMAL LOW (ref >59)

## 2023-10-03 LAB — LIPID PANEL
Chol/HDL Ratio: 2 ratio (ref 0.0–5.0)
Cholesterol, Total: 117 mg/dL (ref 100–199)
HDL: 60 mg/dL (ref >39)
LDL Chol Calc (NIH): 39 mg/dL (ref 0–99)
Triglycerides: 95 mg/dL (ref 0–149)
VLDL Cholesterol Cal: 18 mg/dL (ref 5–40)

## 2024-01-28 DIAGNOSIS — H401132 Primary open-angle glaucoma, bilateral, moderate stage: Secondary | ICD-10-CM | POA: Diagnosis not present

## 2024-03-25 DIAGNOSIS — Z23 Encounter for immunization: Secondary | ICD-10-CM | POA: Diagnosis not present

## 2024-03-31 ENCOUNTER — Ambulatory Visit: Admitting: Nurse Practitioner

## 2024-03-31 ENCOUNTER — Encounter: Payer: Self-pay | Admitting: Nurse Practitioner

## 2024-03-31 VITALS — BP 120/78 | HR 85 | Temp 97.3°F | Ht 70.0 in | Wt 155.0 lb

## 2024-03-31 DIAGNOSIS — Z6827 Body mass index (BMI) 27.0-27.9, adult: Secondary | ICD-10-CM

## 2024-03-31 DIAGNOSIS — E782 Mixed hyperlipidemia: Secondary | ICD-10-CM | POA: Diagnosis not present

## 2024-03-31 DIAGNOSIS — K591 Functional diarrhea: Secondary | ICD-10-CM

## 2024-03-31 DIAGNOSIS — E559 Vitamin D deficiency, unspecified: Secondary | ICD-10-CM | POA: Diagnosis not present

## 2024-03-31 DIAGNOSIS — I739 Peripheral vascular disease, unspecified: Secondary | ICD-10-CM

## 2024-03-31 DIAGNOSIS — N1832 Chronic kidney disease, stage 3b: Secondary | ICD-10-CM

## 2024-03-31 DIAGNOSIS — I482 Chronic atrial fibrillation, unspecified: Secondary | ICD-10-CM | POA: Diagnosis not present

## 2024-03-31 DIAGNOSIS — K219 Gastro-esophageal reflux disease without esophagitis: Secondary | ICD-10-CM

## 2024-03-31 DIAGNOSIS — I10 Essential (primary) hypertension: Secondary | ICD-10-CM | POA: Diagnosis not present

## 2024-03-31 DIAGNOSIS — R531 Weakness: Secondary | ICD-10-CM

## 2024-03-31 LAB — LIPID PANEL

## 2024-03-31 MED ORDER — OMEPRAZOLE 20 MG PO CPDR: 20.0000 mg | DELAYED_RELEASE_CAPSULE | Freq: Every day | ORAL | 1 refills | Status: AC

## 2024-03-31 MED ORDER — CILOSTAZOL 100 MG PO TABS: 100.0000 mg | ORAL_TABLET | Freq: Two times a day (BID) | ORAL | 1 refills | Status: AC

## 2024-03-31 MED ORDER — APIXABAN 2.5 MG PO TABS: 2.5000 mg | ORAL_TABLET | Freq: Two times a day (BID) | ORAL | 1 refills | Status: AC

## 2024-03-31 MED ORDER — LISINOPRIL 40 MG PO TABS: 40.0000 mg | ORAL_TABLET | Freq: Every day | ORAL | 1 refills | Status: DC

## 2024-03-31 MED ORDER — HYDROCHLOROTHIAZIDE 25 MG PO TABS: 25.0000 mg | ORAL_TABLET | Freq: Every day | ORAL | 1 refills | Status: DC

## 2024-03-31 MED ORDER — METOPROLOL SUCCINATE ER 100 MG PO TB24: 100.0000 mg | ORAL_TABLET | Freq: Every day | ORAL | 1 refills | Status: AC

## 2024-03-31 NOTE — Patient Instructions (Signed)
 Food Choices to Help Relieve Diarrhea, Adult Diarrhea can make you feel weak and cause you to become dehydrated. Dehydration is a condition in which there is not enough water or other fluids in the body. It is important to choose the right foods and drinks to: Relieve diarrhea. Replace lost fluids and nutrients. Prevent dehydration. What are tips for following this plan? Relieving diarrhea Avoid foods that make your diarrhea worse. These may include: Foods and drinks that are sweetened with high-fructose corn syrup, honey, or sweeteners such as xylitol, sorbitol, and mannitol. Check food labels for these ingredients. Fried, greasy, or spicy foods. Raw fruits and vegetables. Eat foods that are rich in probiotics. These include foods such as yogurt and fermented milk products. Probiotics can help increase healthy bacteria in your stomach and intestines (gastrointestinal or GI tract). This may help digestion and stop diarrhea. If you have lactose intolerance, avoid dairy products. These may make your diarrhea worse. Take medicine to help stop diarrhea only as told by your health care provider. Replacing nutrients  Eat bland, easy-to-digest foods in small amounts as you are able, until your diarrhea starts to get better. These foods include bananas, applesauce, rice, toast, and crackers. Over time, add nutrient-rich foods as your body tolerates them or as told by your health care provider. These include: Well-cooked protein foods, such as eggs, lean meats like fish or chicken without skin, and tofu. Peeled, seeded, and soft-cooked fruits and vegetables. Low-fat dairy products. Whole grains. Take vitamin and mineral supplements as told by your health care provider. Preventing dehydration  Start by sipping water or a solution to prevent dehydration (oral rehydration solution, or ORS). This is a drink that helps replace fluids and minerals your body has lost. You can buy an ORS at pharmacies and  retail stores. Try to drink at least 8-10 cups (2,000-2,500 mL) of fluid each day to help replace lost fluids. If your urine is pale yellow, you are getting enough fluids. You may drink other liquids in addition to water, such as fruit juice that you have added water to (diluted fruit juice) or low-calorie sports drinks, as tolerated or as told by your health care provider. Avoid drinks with caffeine, such as coffee, tea, or soft drinks. Avoid alcohol. This information is not intended to replace advice given to you by your health care provider. Make sure you discuss any questions you have with your health care provider. Document Revised: 11/08/2021 Document Reviewed: 11/08/2021 Elsevier Patient Education  2024 [employer].

## 2024-03-31 NOTE — Progress Notes (Addendum)
 Subjective:    Patient ID: Frank Cook, male    DOB: 23-Jan-1932, 89 y.o.   MRN: [id number]   Chief Complaint: medical management of chronic issues     HPI:  Frank Cook is a 89 y.o. who identifies as a male who was assigned male at birth.   Social history: Lives with: by himself-niece checks on him daily Work history: retired   Water Engineer in today for follow up of the following chronic medical issues:  1. Primary hypertension No c/o chest pain, sob or headache. Does not check blood pressure at home. BP Readings from Last 3 Encounters:  10/02/23 128/80  04/11/23 120/80  03/30/23 123/76     2. Chronic atrial fibrillation (HCC) No c/o palpitations or heart racing.  3. Gastroesophageal reflux disease without esophagitis Is on omeprazole  daily and is doing well  4. Mixed hyperlipidemia Eats whatever her nieces bring her to eat. Lab Results  Component Value Date   CHOL 117 10/02/2023   HDL 60 10/02/2023   LDLCALC 39 10/02/2023   TRIG 95 10/02/2023   CHOLHDL 2.0 10/02/2023     5. Stage 3b chronic kidney disease (HCC) No voiding issues Lab Results  Component Value Date   CREATININE 1.42 (H) 10/02/2023     6. Vitamin D deficiency Is on daily vitamin d supplement  7. BMI 24.0-24.9,adult Weight is down 5lbs  Wt Readings from Last 3 Encounters:  03/31/24 155 lb (70.3 kg)  10/02/23 160 lb (72.6 kg)  08/17/23 169 lb (76.7 kg)   BMI Readings from Last 3 Encounters:  03/31/24 22.24 kg/m  10/02/23 22.96 kg/m  08/17/23 24.25 kg/m       New complaints: Loose stools daily- last visit we did KUB and was normal Is getting very weak and worries about falling  No Known Allergies Outpatient Encounter Medications as of 03/31/2024  Medication Sig   apixaban  (ELIQUIS ) 2.5 MG TABS tablet Take 1 tablet (2.5 mg total) by mouth 2 (two) times daily.   Cholecalciferol (VITAMIN D3) 5000 units CAPS Take 5,000 Units by mouth daily.   cilostazol  (PLETAL ) 100 MG tablet  Take 1 tablet (100 mg total) by mouth 2 (two) times daily.   Ferrous Sulfate (IRON PO) Take by mouth.   hydrochlorothiazide  (HYDRODIURIL ) 25 MG tablet Take 1 tablet (25 mg total) by mouth daily.   latanoprost (XALATAN) 0.005 % ophthalmic solution Place 1 drop into both eyes at bedtime.   lisinopril  (ZESTRIL ) 40 MG tablet Take 1 tablet (40 mg total) by mouth daily.   metoprolol  succinate (TOPROL -XL) 100 MG 24 hr tablet Take 1 tablet (100 mg total) by mouth daily. with food   Omega 3 1000 MG CAPS Take 2,000 mg by mouth daily.   omeprazole  (PRILOSEC) 20 MG capsule Take 1 capsule (20 mg total) by mouth daily.   Simethicone (GAS-X PO) Take by mouth.   simvastatin  (ZOCOR ) 20 MG tablet Take 1 tablet (20 mg total) by mouth daily.   No facility-administered encounter medications on file as of 03/31/2024.    Past Surgical History:  Procedure Laterality Date   CATARACT EXTRACTION Bilateral    COLONOSCOPY     None      Family History  Problem Relation Age of Onset   COPD Mother    Cancer Father        Bone   Cancer Brother        Bone   Stroke Maternal Grandfather    Arthritis Brother    Hypertension Brother  Controlled substance contract: n/a     Review of Systems  Constitutional:  Negative for diaphoresis.  Eyes:  Negative for pain.  Respiratory:  Negative for shortness of breath.   Cardiovascular:  Negative for chest pain, palpitations and leg swelling.  Gastrointestinal:  Negative for abdominal pain.  Endocrine: Negative for polydipsia.  Skin:  Negative for rash.  Neurological:  Negative for dizziness, weakness and headaches.  Hematological:  Does not bruise/bleed easily.  All other systems reviewed and are negative.      Objective:   Physical Exam Vitals and nursing note reviewed.  Constitutional:      Appearance: Normal appearance. He is well-developed.  HENT:     Head: Normocephalic.     Nose: Nose normal.     Mouth/Throat:     Mouth: Mucous membranes  are moist.     Pharynx: Oropharynx is clear.  Eyes:     Pupils: Pupils are equal, round, and reactive to light.  Neck:     Thyroid: No thyroid mass or thyromegaly.     Vascular: No carotid bruit or JVD.     Trachea: Phonation normal.  Cardiovascular:     Rate and Rhythm: Normal rate. Rhythm irregular.  Pulmonary:     Effort: Pulmonary effort is normal. No respiratory distress.     Breath sounds: Normal breath sounds.  Abdominal:     General: Bowel sounds are normal.     Palpations: Abdomen is soft.     Tenderness: There is no abdominal tenderness.  Musculoskeletal:        General: Normal range of motion.     Cervical back: Normal range of motion and neck supple.     Comments: In wheel chair today  Lymphadenopathy:     Cervical: No cervical adenopathy.  Skin:    General: Skin is warm and dry.  Neurological:     Mental Status: He is alert and oriented to person, place, and time.  Psychiatric:        Behavior: Behavior normal.        Thought Content: Thought content normal.        Judgment: Judgment normal.    BP 120/78   Pulse 85   Temp (!) 97.3 F (36.3 C) (Temporal)   Ht 5' 10 (1.778 m)   Wt 155 lb (70.3 kg)   SpO2 99%   BMI 22.24 kg/m        Assessment & Plan:   Frank Cook comes in today with chief complaint of medical management of chronic issues    Diagnosis and orders addressed:  1. Primary hypertension Low sodium diet - hydrochlorothiazide  (HYDRODIURIL ) 25 MG tablet; Take 1 tablet (25 mg total) by mouth daily.  Dispense: 90 tablet; Refill: 1 - lisinopril  (ZESTRIL ) 40 MG tablet; Take 1 tablet (40 mg total) by mouth daily.  Dispense: 90 tablet; Refill: 1 - metoprolol  succinate (TOPROL -XL) 100 MG 24 hr tablet; Take 1 tablet (100 mg total) by mouth daily. with food  Dispense: 90 tablet; Refill: 1 - CBC with Differential/Platelet - CMP14+EGFR  2. Chronic atrial fibrillation (HCC) Avoid caffeine - apixaban  (ELIQUIS ) 2.5 MG TABS tablet; Take 1 tablet  (2.5 mg total) by mouth 2 (two) times daily.  Dispense: 180 tablet; Refill: 1  3. Gastroesophageal reflux disease without esophagitis Avoid spicy foods Do not eat 2 hours prior to bedtime - omeprazole  (PRILOSEC) 20 MG capsule; Take 1 capsule (20 mg total) by mouth daily.  Dispense: 90 capsule; Refill: 1  4.  Mixed hyperlipidemia Low fat diet - simvastatin  (ZOCOR ) 20 MG tablet; Take 1 tablet (20 mg total) by mouth daily.  Dispense: 90 tablet; Refill: 1 - Lipid panel  5. Stage 3b chronic kidney disease (HCC) Labs pending  6. Vitamin D deficiency Continue daily vitamin d supplement  7. BMI 27.0-27.9,adult Discussed diet and exercise for person with BMI >25 Will recheck weight in 3-6 months   8. PVD (peripheral vascular disease) (HCC) - cilostazol  (PLETAL ) 100 MG tablet; Take 1 tablet (100 mg total) by mouth 2 (two) times daily.  Dispense: 180 tablet; Refill: 1  9. Loose stools Try imodium AD otc Probiobotic gummy daily  Labs pending Health Maintenance reviewed Diet and exercise encouraged  Follow up plan: 6 months   Mary-Margaret Gladis, FNP

## 2024-04-01 ENCOUNTER — Ambulatory Visit: Payer: Self-pay | Admitting: Nurse Practitioner

## 2024-04-01 DIAGNOSIS — R748 Abnormal levels of other serum enzymes: Secondary | ICD-10-CM

## 2024-04-01 LAB — CBC WITH DIFFERENTIAL/PLATELET
Basophils Absolute: 0 x10E3/uL (ref 0.0–0.2)
Basos: 0 %
EOS (ABSOLUTE): 0.1 x10E3/uL (ref 0.0–0.4)
Eos: 2 %
Hematocrit: 35.6 % — ABNORMAL LOW (ref 37.5–51.0)
Hemoglobin: 12.1 g/dL — ABNORMAL LOW (ref 13.0–17.7)
Immature Grans (Abs): 0 x10E3/uL (ref 0.0–0.1)
Immature Granulocytes: 0 %
Lymphocytes Absolute: 1.4 x10E3/uL (ref 0.7–3.1)
Lymphs: 29 %
MCH: 31.7 pg (ref 26.6–33.0)
MCHC: 34 g/dL (ref 31.5–35.7)
MCV: 93 fL (ref 79–97)
Monocytes Absolute: 0.4 x10E3/uL (ref 0.1–0.9)
Monocytes: 8 %
Neutrophils Absolute: 3 x10E3/uL (ref 1.4–7.0)
Neutrophils: 60 %
Platelets: 191 x10E3/uL (ref 150–450)
RBC: 3.82 x10E6/uL — ABNORMAL LOW (ref 4.14–5.80)
RDW: 13.3 % (ref 11.6–15.4)
WBC: 4.9 x10E3/uL (ref 3.4–10.8)

## 2024-04-01 LAB — CMP14+EGFR
ALT: 14 IU/L (ref 0–44)
AST: 20 IU/L (ref 0–40)
Albumin: 4.3 g/dL (ref 3.6–4.6)
Alkaline Phosphatase: 33 IU/L — AB (ref 48–129)
BUN/Creatinine Ratio: 21 (ref 10–24)
BUN: 44 mg/dL — AB (ref 10–36)
Bilirubin Total: 0.4 mg/dL (ref 0.0–1.2)
CO2: 23 mmol/L (ref 20–29)
Calcium: 10.6 mg/dL — AB (ref 8.6–10.2)
Chloride: 101 mmol/L (ref 96–106)
Creatinine, Ser: 2.11 mg/dL — AB (ref 0.76–1.27)
Globulin, Total: 2.5 g/dL (ref 1.5–4.5)
Glucose: 103 mg/dL — AB (ref 70–99)
Potassium: 3.8 mmol/L (ref 3.5–5.2)
Sodium: 141 mmol/L (ref 134–144)
Total Protein: 6.8 g/dL (ref 6.0–8.5)
eGFR: 29 mL/min/1.73 — AB (ref >59)

## 2024-04-01 LAB — LIPID PANEL
Cholesterol, Total: 111 mg/dL (ref 100–199)
HDL: 56 mg/dL (ref >39)
LDL CALC COMMENT:: 2 ratio (ref 0.0–5.0)
LDL Chol Calc (NIH): 39 mg/dL (ref 0–99)
Triglycerides: 80 mg/dL (ref 0–149)
VLDL Cholesterol Cal: 16 mg/dL (ref 5–40)

## 2024-04-02 ENCOUNTER — Ambulatory Visit: Payer: Self-pay

## 2024-04-02 NOTE — Telephone Encounter (Signed)
 Have him go ahead and stop the probiotic and he can use some Imodium and then just let us  know if it does not improve.

## 2024-04-02 NOTE — Telephone Encounter (Signed)
 I called and spoke with Frank Cook and made her aware of Dr Dettingers recommendations. She voiced understanding and will call back tomorrow to update us .

## 2024-04-02 NOTE — Telephone Encounter (Signed)
 FYI Only or Action Required?: Action required by provider: update on patient condition and please advise any additional recommendations.  Patient was last seen in primary care on 03/31/2024 by Gladis Mustard, FNP.  Called Nurse Triage reporting Diarrhea.  Symptoms began several years ago.  Interventions attempted: OTC medications: Probiotic gummy.  Symptoms are: gradually worsening.  Triage Disposition: See PCP Within 2 Weeks  Patient/caregiver understands and will follow disposition?: No, wishes to speak with PCP  Message from Severy E sent at 04/02/2024 11:13 AM EDT  Summary: Diarrhea   Reason for Triage: Pt's niece called reporting that he has had a massive diarrhea episode, the pt believes this is due to a probiotic that was prescribed to him.  Best contact: [phone] Cy Bar, niece         Reason for Disposition  Diarrhea is a chronic symptom (recurrent or ongoing AND present > 4 weeks)  Answer Assessment - Initial Assessment Questions Spoke with Cy, pt niece. Pt had an episode of massive diarrhea and believes it is due to starting the probiotic gummy yesterday per provider recommendation.  Functional diarrhea- as far back as at least 2024. Scared to leave the house bc he is worried about having an accident Probiotic gummies- started yesterday. 2 in AM and 2 in PM. Has not tried the Imodium. Niece is on his way to bring him some.  Denies fever, abdominal pain, N/V, or blood in stool.   Advised to stop the probiotic and start the imodium. Give the imodium a chance to help regulate his loose stools, then can start probiotic back at a smaller dose to get used to it. Can try 1 gummy in AM for a few days and increase to 1 gummy BID, 2 gummies in AM and 1 in PM, then 2 gummies BID. Niece aware to call back with any new or worsening symptoms. Please reach out to [facility] with any further recommendations   1. DIARRHEA SEVERITY: How bad is the diarrhea? How many  more stools have you had in the past 24 hours than normal?      Very large today  2. ONSET: When did the diarrhea begin?      Ongoing  3. STOOL DESCRIPTION:  How loose or watery is the diarrhea? What is the stool color? Is there any blood or mucous in the stool?     loose 4. VOMITING: Are you also vomiting? If Yes, ask: How many times in the past 24 hours?       denies 5. ABDOMEN PAIN: Are you having any abdomen pain? If Yes, ask: What does it feel like? (e.g., crampy, dull, intermittent, constant)      denies 6. ABDOMEN PAIN SEVERITY: If present, ask: How bad is the pain?  (e.g., Scale 1-10; mild, moderate, or severe)     denies 7. ORAL INTAKE: If vomiting, Have you been able to drink liquids? How much liquids have you had in the past 24 hours?     Drinks water- but niece convinced that he may not be getting enough. 8. HYDRATION: Any signs of dehydration? (e.g., dry mouth [not just dry lips], too weak to stand, dizziness, new weight loss) When did you last urinate?     Probably not as well as he should 9. EXPOSURE: Have you traveled to a foreign country recently? Have you been exposed to anyone with diarrhea? Could you have eaten any food that was spoiled?     Denies- worried to leave the house  10.  ANTIBIOTIC USE: Are you taking antibiotics now or have you taken antibiotics in the past 2 months?       Denies recent use 11. OTHER SYMPTOMS: Do you have any other symptoms? (e.g., fever, blood in stool)       Denies blood, fever  Protocols used: Diarrhea-A-AH

## 2024-04-14 NOTE — Addendum Note (Signed)
 Addended by: GLADIS MUSTARD on: 04/14/2024 10:06 AM   Modules accepted: Orders

## 2024-04-15 DIAGNOSIS — B351 Tinea unguium: Secondary | ICD-10-CM | POA: Diagnosis not present

## 2024-04-15 DIAGNOSIS — M79672 Pain in left foot: Secondary | ICD-10-CM | POA: Diagnosis not present

## 2024-04-15 DIAGNOSIS — M79671 Pain in right foot: Secondary | ICD-10-CM | POA: Diagnosis not present

## 2024-04-16 ENCOUNTER — Telehealth: Payer: Self-pay | Admitting: Nurse Practitioner

## 2024-04-16 NOTE — Telephone Encounter (Unsigned)
 Copied from CRM [id number]. Topic: Clinical - Home Health Verbal Orders >> Apr 16, 2024  4:43 PM Delon DASEN wrote: Caller/Agency: Bernarda with [facility] Callback Number: [phone] Service Requested: Physical Therapy and Speech Therapy Frequency: n/a needs eval Any new concerns about the patient? No-   would like a delay of care, would like to start on 11/14

## 2024-04-16 NOTE — Telephone Encounter (Signed)
 Left message for Frank Cook from [facility] to call back. If she does, please give her verbal approval for delay of care to start Physical Therapy and Speech Therapy Evals on 04/18/2024.

## 2024-04-21 DIAGNOSIS — N179 Acute kidney failure, unspecified: Secondary | ICD-10-CM | POA: Diagnosis not present

## 2024-04-21 DIAGNOSIS — R627 Adult failure to thrive: Secondary | ICD-10-CM | POA: Diagnosis not present

## 2024-04-21 DIAGNOSIS — R809 Proteinuria, unspecified: Secondary | ICD-10-CM | POA: Diagnosis not present

## 2024-04-21 DIAGNOSIS — D509 Iron deficiency anemia, unspecified: Secondary | ICD-10-CM | POA: Diagnosis not present

## 2024-04-21 DIAGNOSIS — I129 Hypertensive chronic kidney disease with stage 1 through stage 4 chronic kidney disease, or unspecified chronic kidney disease: Secondary | ICD-10-CM | POA: Diagnosis not present

## 2024-04-21 DIAGNOSIS — I4891 Unspecified atrial fibrillation: Secondary | ICD-10-CM | POA: Diagnosis not present

## 2024-04-21 DIAGNOSIS — K219 Gastro-esophageal reflux disease without esophagitis: Secondary | ICD-10-CM | POA: Diagnosis not present

## 2024-04-21 DIAGNOSIS — E559 Vitamin D deficiency, unspecified: Secondary | ICD-10-CM | POA: Diagnosis not present

## 2024-04-21 DIAGNOSIS — N1832 Chronic kidney disease, stage 3b: Secondary | ICD-10-CM | POA: Diagnosis not present

## 2024-04-23 ENCOUNTER — Other Ambulatory Visit (HOSPITAL_COMMUNITY): Payer: Self-pay | Admitting: Nephrology

## 2024-04-23 DIAGNOSIS — N179 Acute kidney failure, unspecified: Secondary | ICD-10-CM

## 2024-04-23 NOTE — Telephone Encounter (Unsigned)
 Copied from CRM [id number]. Topic: Clinical - Home Health Verbal Orders >> Apr 23, 2024 11:42 AM Miquel SAILOR wrote: Caller/Agency: Bernarda from [facility]  Callback Number: [phone] Service Requested: Skilled Nursing(Evaluation) Frequency: Will be when they go out  Any new concerns about the patient? No

## 2024-04-23 NOTE — Telephone Encounter (Signed)
 Called and spoke with Frank Cook at [facility] and she states that the patients family reports patient having diarrhea for several days now and was unaware that pcp recommended immodium to take the last time this was reported. Family is going to get patient to start taking immodium today.   Gave Frank Cook verbal approval to have RN come out to patients home for an eval tomorrow, 11/20. Possibly dehydrated.

## 2024-04-30 ENCOUNTER — Ambulatory Visit (HOSPITAL_BASED_OUTPATIENT_CLINIC_OR_DEPARTMENT_OTHER)
Admission: RE | Admit: 2024-04-30 | Discharge: 2024-04-30 | Disposition: A | Discharge status: Home / Self Care | Source: Ambulatory Visit | Attending: Nephrology | Admitting: Nephrology

## 2024-04-30 DIAGNOSIS — N179 Acute kidney failure, unspecified: Secondary | ICD-10-CM | POA: Diagnosis present

## 2024-04-30 DIAGNOSIS — N281 Cyst of kidney, acquired: Secondary | ICD-10-CM | POA: Diagnosis not present

## 2024-05-06 ENCOUNTER — Ambulatory Visit: Payer: Self-pay

## 2024-05-06 ENCOUNTER — Encounter: Payer: Self-pay | Admitting: Nurse Practitioner

## 2024-05-06 ENCOUNTER — Ambulatory Visit: Admitting: Nurse Practitioner

## 2024-05-06 ENCOUNTER — Other Ambulatory Visit: Payer: Self-pay | Admitting: Nephrology

## 2024-05-06 ENCOUNTER — Telehealth: Payer: Self-pay | Admitting: Cardiology

## 2024-05-06 VITALS — BP 91/55 | HR 119 | Temp 97.6°F | Ht 70.0 in | Wt 147.0 lb

## 2024-05-06 DIAGNOSIS — N1832 Chronic kidney disease, stage 3b: Secondary | ICD-10-CM

## 2024-05-06 DIAGNOSIS — I952 Hypotension due to drugs: Secondary | ICD-10-CM

## 2024-05-06 DIAGNOSIS — R748 Abnormal levels of other serum enzymes: Secondary | ICD-10-CM | POA: Diagnosis not present

## 2024-05-06 DIAGNOSIS — R531 Weakness: Secondary | ICD-10-CM

## 2024-05-06 DIAGNOSIS — R634 Abnormal weight loss: Secondary | ICD-10-CM

## 2024-05-06 DIAGNOSIS — R630 Anorexia: Secondary | ICD-10-CM

## 2024-05-06 DIAGNOSIS — N179 Acute kidney failure, unspecified: Secondary | ICD-10-CM

## 2024-05-06 MED ORDER — MEGESTROL ACETATE 40 MG PO TABS: 40.0000 mg | ORAL_TABLET | Freq: Every day | ORAL | 2 refills | Status: DC

## 2024-05-06 NOTE — Telephone Encounter (Signed)
 FYI Only or Action Required?: FYI only for provider: appointment scheduled on 12/2.  Patient was last seen in primary care on 03/31/2024 by Gladis Mustard, FNP.  Called Nurse Triage reporting Hypotension.  Symptoms began several weeks ago.  Interventions attempted: Rest, hydration, or home remedies.  Symptoms are: unchanged.  Triage Disposition: See HCP Within 4 Hours (Or PCP Triage)  Patient/caregiver understands and will follow disposition?: Yes                         Reason for Disposition  [1] Systolic BP < 90 AND [2] NOT feeling weak or lightheaded  Answer Assessment - Initial Assessment Questions 1. BLOOD PRESSURE: What is your blood pressure? Did you take at least two measurements 5 minutes apart?      Home health nurse took B/P this morning and received a reading of 80/54   2. ONSET: When did you take your blood pressure?     This morning    3. HOW: How did you take your blood pressure? (e.g., visiting nurse, automatic home BP monitor)     visiting nurse   4. HISTORY: Do you have a history of low blood pressure? What is your blood pressure normally?     No   5. MEDICINES: Are you taking any medicines for blood pressure? If Yes, ask: Have they been changed recently?      Hydrochlorothiazide  was decreased, as well as the lisinopril  last week        7. OTHER SYMPTOMS: Have you been sick recently? Have you had a recent injury?      Home health nurse stated, poor appetite is a concern, as well as BIL lower leg pain.     [facility] nurse Ellouise calling to report the patient has BIL lower leg pain x 2-3 months, poor appetite x 2 weeks, and low B/P x 2 weeks.  Patient denies dizziness or any acute symptoms. SOB with exertion has been ongoing as well per the nurse.  The last  B/P per the nurse was 80/54. No acute symptoms noted at this time, per the nurse, however the patient did tell her he feel off. Appointment  scheduled for evaluation. She agrees with plan of care and will call back if anything changes, or if symptoms worsen.  Protocols used: Blood Pressure - Low-A-AH

## 2024-05-06 NOTE — Progress Notes (Signed)
 Subjective:    Patient ID: Frank Cook, male    DOB: [birth year], 89 y.o.   MRN: [id number]   Chief Complaint: weakness  HPI  Patient was last seen on 03/31/24 for chronic follow up. He appeared weaker the usual and appetite was not good. Labs showed increase in creatine. Has seen nephrology and they want labs repeated today. We ordered PT for him in the home. That has not helped.  A nurse has been coming out weekly to check his blood pressure. Since he was seen last he has become progressively weaker. Very poor appetite. His niece no longer feels that he is safe to stay in his home alone. Family wants hmim ready to go to nursing facility when he says he is ready.   Patient Active Problem List   Diagnosis Date Noted   Functional diarrhea 10/02/2023   Stage 3b chronic kidney disease (HCC) 01/11/2021   PVD (peripheral vascular disease) 02/17/2019   BMI 27.0-27.9,adult 03/29/2015   Atrial fibrillation (HCC) 01/27/2015   GERD (gastroesophageal reflux disease) 12/11/2013   Hyperlipidemia 10/24/2012   Vitamin D deficiency 10/24/2012   Primary hypertension 04/14/2010       Review of Systems  Constitutional:  Negative for diaphoresis.  Eyes:  Negative for pain.  Respiratory:  Negative for shortness of breath.   Cardiovascular:  Negative for chest pain, palpitations and leg swelling.  Gastrointestinal:  Negative for abdominal pain.  Endocrine: Negative for polydipsia.  Skin:  Negative for rash.  Neurological:  Negative for dizziness, weakness and headaches.  Hematological:  Does not bruise/bleed easily.  All other systems reviewed and are negative.      Objective:   Physical Exam Constitutional:      Appearance: Normal appearance.  Cardiovascular:     Rate and Rhythm: Normal rate and regular rhythm.     Heart sounds: Normal heart sounds.  Pulmonary:     Effort: Pulmonary effort is normal.     Breath sounds: Normal breath sounds.  Musculoskeletal:     Comments: In wheel  chair  Skin:    General: Skin is warm.  Neurological:     General: No focal deficit present.     Mental Status: He is alert and oriented to person, place, and time.  Psychiatric:        Mood and Affect: Mood normal.        Behavior: Behavior normal.     BP (!) 91/55   Pulse (!) 119   Temp 97.6 F (36.4 C) (Temporal)   Ht 5' 10 (1.778 m)   Wt 147 lb (66.7 kg)   SpO2 97%   BMI 21.09 kg/m        Assessment & Plan:   Conor Lata Radloff in today with chief complaint of Weakness   1. Hypotension due to drugs (Primary) Stop lisinopril  Keep check of blood pressure at home  2. Poor appetite Added megace to increase appetite Boost between meals as needed  3. Weight loss Weight 2x a week  4. Weakness Fall prevention - CBC with Differential/Platelet  5. Elevated creatine kinase Labs pending' send results to [facility] - BMP8+EGFR  40 minutes spent with patient and family  The above assessment and management plan was discussed with the patient. The patient verbalized understanding of and has agreed to the management plan. Patient is aware to call the clinic if symptoms persist or worsen. Patient is aware when to return to the clinic for a follow-up visit. Patient educated on  when it is appropriate to go to the emergency department.   Mary-Margaret Gladis, FNP

## 2024-05-06 NOTE — Telephone Encounter (Signed)
 Spoke with [facility] nurse.  Pt is scheduled to see his PCP this afternoon for further evaluation of low BP and possible LTC placement.  Pt's medications recently changed by nephrology d/t acute kidney injury.  Pt is due for his yearly follow up with Dr Lavona.  Will forward to scheduling to assist with appointment.  Home Health nurse thanked Rn for the callback.

## 2024-05-06 NOTE — Telephone Encounter (Signed)
 Pt c/o BP issue: STAT if pt c/o blurred vision, one-sided weakness or slurred speech.  STAT if BP is GREATER than 180/120 TODAY.  STAT if BP is LESS than 90/60 and SYMPTOMATIC TODAY  1. What is your BP concern? Ellouise, RN called in stating pt bp has been running low for last couple weeks   2. Have you taken any BP medication today? Yes; hydrochlorothiazide  12.5mg  daily, lisinopril  20mg  daily, metoprolol  still the same (100mg  daily). She states pt's kidney doctor made the changes to hydrochlorothiazide  and lisinopril    3. What are your last 5 BP readings?  12/2: 80/52 - sitting  12/2: 76/46 - standing  80's/50's    4. Are you having any other symptoms (ex. Dizziness, headache, blurred vision, passed out)? No

## 2024-05-07 ENCOUNTER — Ambulatory Visit: Admitting: Cardiology

## 2024-05-07 ENCOUNTER — Encounter: Payer: Self-pay | Admitting: Cardiology

## 2024-05-07 VITALS — BP 102/60 | HR 95 | Ht 70.0 in | Wt 146.0 lb

## 2024-05-07 DIAGNOSIS — N1832 Chronic kidney disease, stage 3b: Secondary | ICD-10-CM | POA: Diagnosis not present

## 2024-05-07 DIAGNOSIS — I482 Chronic atrial fibrillation, unspecified: Secondary | ICD-10-CM

## 2024-05-07 DIAGNOSIS — I10 Essential (primary) hypertension: Secondary | ICD-10-CM

## 2024-05-07 LAB — BMP8+EGFR
BUN/Creatinine Ratio: 28 — ABNORMAL HIGH (ref 10–24)
BUN: 60 mg/dL — ABNORMAL HIGH (ref 10–36)
CO2: 23 mmol/L (ref 20–29)
Calcium: 9.4 mg/dL (ref 8.6–10.2)
Chloride: 101 mmol/L (ref 96–106)
Creatinine, Ser: 2.17 mg/dL — ABNORMAL HIGH (ref 0.76–1.27)
Glucose: 113 mg/dL — ABNORMAL HIGH (ref 70–99)
Potassium: 3.5 mmol/L (ref 3.5–5.2)
Sodium: 141 mmol/L (ref 134–144)
eGFR: 28 mL/min/1.73 — ABNORMAL LOW (ref >59)

## 2024-05-07 LAB — CBC WITH DIFFERENTIAL/PLATELET
Basophils Absolute: 0 x10E3/uL (ref 0.0–0.2)
Basos: 0 %
EOS (ABSOLUTE): 0.1 x10E3/uL (ref 0.0–0.4)
Eos: 2 %
Hematocrit: 32.5 % — ABNORMAL LOW (ref 37.5–51.0)
Hemoglobin: 10.7 g/dL — ABNORMAL LOW (ref 13.0–17.7)
Immature Grans (Abs): 0 x10E3/uL (ref 0.0–0.1)
Immature Granulocytes: 0 %
Lymphocytes Absolute: 0.8 x10E3/uL (ref 0.7–3.1)
Lymphs: 16 %
MCH: 31.6 pg (ref 26.6–33.0)
MCHC: 32.9 g/dL (ref 31.5–35.7)
MCV: 96 fL (ref 79–97)
Monocytes Absolute: 0.3 x10E3/uL (ref 0.1–0.9)
Monocytes: 7 %
Neutrophils Absolute: 3.6 x10E3/uL (ref 1.4–7.0)
Neutrophils: 75 %
Platelets: 211 x10E3/uL (ref 150–450)
RBC: 3.39 x10E6/uL — ABNORMAL LOW (ref 4.14–5.80)
RDW: 13.7 % (ref 11.6–15.4)
WBC: 4.8 x10E3/uL (ref 3.4–10.8)

## 2024-05-07 NOTE — Patient Instructions (Addendum)

## 2024-05-07 NOTE — Progress Notes (Signed)
 Cardiology Office Note:   Date:  05/07/2024  ID:  Frank Cook, DOB 1932/03/01, MRN [id number] PCP: Gladis Mustard, FNP  [facility] Providers Cardiologist:  Lynwood Schilling, MD {  History of Present Illness:   Frank Cook is a 89 y.o. male who presents for follow-up of atrial fibrillation.  Since I last saw him he has had decreased appetite and weight loss.  Is been having some watery diarrhea.  Home health nursing has been coming out noting him to have low blood pressures and they called and he was added on schedule.  He saw his primary provider.  He was told to stop his lisinopril  although when he did not want to do that until he saw us .  He did have blood work done and I note that he has got some mildly worsened anemia 10.7.  Creatinine is 2.17 which is up from previous.  He is frail and gets around with a walker at home.  He has not had any syncope or presyncope but he does notice that he is dizzy if he stands up too quickly.  He has not noticed any bleeding in his bowel movements.  He has had no dark black stools.  He has had no chest pressure, neck or arm discomfort.  He has had no new PND or orthopnea.  He does not notice his atrial fibrillation.   ROS: As stated in the HPI and negative for all other systems.  Studies Reviewed:    EKG:   EKG Interpretation Date/Time:  Wednesday May 07 2024 13:02:06 EST Ventricular Rate:  95 PR Interval:    QRS Duration:  136 QT Interval:  390 QTC Calculation: 490 R Axis:   83  Text Interpretation: Atrial fibrillation Right bundle branch block T wave abnormality, consider inferior ischemia When compared with ECG of 11-Apr-2023 15:40, No significant change since last tracing Confirmed by Schilling Lynwood ([id number]) on 05/07/2024 1:07:57 PM    Risk Assessment/Calculations:    CHA2DS2-VASc Score = 3   This indicates a 3.2% annual risk of stroke. The patient's score is based upon: CHF History: 0 HTN History: 1 Diabetes  History: 0 Stroke History: 0 Vascular Disease History: 0 Age Score: 2 Gender Score: 0    Physical Exam:   VS:  BP 102/60   Pulse 95   Ht 5' 10 (1.778 m)   Wt 146 lb (66.2 kg)   BMI 20.95 kg/m    Wt Readings from Last 3 Encounters:  05/07/24 146 lb (66.2 kg)  05/06/24 147 lb (66.7 kg)  03/31/24 155 lb (70.3 kg)     GEN: Frail appearing.   NECK: No JVD; No carotid bruits CARDIAC: Irregular RR, no murmurs, rubs, gallops RESPIRATORY:  Clear to auscultation without rales, wheezing or rhonchi  ABDOMEN: Soft, non-tender, non-distended EXTREMITIES:  No edema; No deformity   ASSESSMENT AND PLAN:   ATRIAL FIB:    He tolerates anticoagulation and is on the appropriate dose.  However, I do note that he has some progressive anemia.  I have asked him to follow-up with his primary provider with labs in the next weeks to make sure he is not drifting down further and if there is any thoughts of blood loss anemia he can have his anticoagulation held.   HTN: His blood pressure is running low.  I agree with discontinuing the lisinopril  and have stopped the HCTZ as well.  CKD: His creatinine is elevated and he is scheduled to see nephrology.  He will  stop the medications as above any will have follow-up basic metabolic profile.  I have encouraged some oral hydration particularly with protein supplement drinks.      Follow up with me in six months.   Signed, Lynwood Schilling, MD

## 2024-05-08 ENCOUNTER — Ambulatory Visit: Payer: Self-pay | Admitting: Nurse Practitioner

## 2024-05-09 ENCOUNTER — Ambulatory Visit

## 2024-06-02 ENCOUNTER — Other Ambulatory Visit

## 2024-06-02 ENCOUNTER — Telehealth: Payer: Self-pay | Admitting: Family Medicine

## 2024-06-02 NOTE — Telephone Encounter (Signed)
 We were suppose  to have already filled  out FL2 foems

## 2024-06-02 NOTE — Telephone Encounter (Unsigned)
 Copied from CRM [id number]. Topic: General - Call Back - No Documentation >> Jun 02, 2024  3:34 PM Roselie BROCKS wrote: Reason for CRM: Patients niece Gloria Lambertson) states she is returning a call to [facility] . She request a return call

## 2024-06-03 NOTE — Telephone Encounter (Signed)
 FL2 form completed and original left up front for patients family to pick up. Notified by provider that it would be ready

## 2024-06-03 NOTE — Telephone Encounter (Signed)
 PCP spoke with niece

## 2024-06-10 ENCOUNTER — Non-Acute Institutional Stay (SKILLED_NURSING_FACILITY): Admitting: Adult Health

## 2024-06-10 ENCOUNTER — Encounter: Payer: Self-pay | Admitting: Adult Health

## 2024-06-10 DIAGNOSIS — I739 Peripheral vascular disease, unspecified: Secondary | ICD-10-CM

## 2024-06-10 DIAGNOSIS — I482 Chronic atrial fibrillation, unspecified: Secondary | ICD-10-CM

## 2024-06-10 DIAGNOSIS — D631 Anemia in chronic kidney disease: Secondary | ICD-10-CM | POA: Insufficient documentation

## 2024-06-10 DIAGNOSIS — I10 Essential (primary) hypertension: Secondary | ICD-10-CM | POA: Diagnosis not present

## 2024-06-10 DIAGNOSIS — G894 Chronic pain syndrome: Secondary | ICD-10-CM | POA: Diagnosis not present

## 2024-06-10 DIAGNOSIS — N1832 Chronic kidney disease, stage 3b: Secondary | ICD-10-CM | POA: Diagnosis not present

## 2024-06-10 DIAGNOSIS — H40053 Ocular hypertension, bilateral: Secondary | ICD-10-CM | POA: Diagnosis not present

## 2024-06-10 DIAGNOSIS — E782 Mixed hyperlipidemia: Secondary | ICD-10-CM

## 2024-06-10 DIAGNOSIS — K219 Gastro-esophageal reflux disease without esophagitis: Secondary | ICD-10-CM | POA: Diagnosis not present

## 2024-06-10 NOTE — Progress Notes (Signed)
 " Location:  [facility] Room Number: 103 Place of Service:  SNF (31)   CODE STATUS: dnr   Allergies[1]  Chief Complaint  Patient presents with   Acute Visit    Follow up transfer     HPI:  He is a 89 year old man who has been transferred to this facility. His past medical history includes: atrial fibrillation; GERD; PVD. Today he is denying pain; states that he has a good appetite and denies any weight loss. He will continue to be followed for his chronic illnesses including: Stage 3b chronic kidney disease: Mixed hyperlipidemia: Increased intraocular pressure bilateral:  Past Medical History:  Diagnosis Date   Atrial fibrillation (HCC)    Colon polyps    GERD (gastroesophageal reflux disease)    Hyperlipidemia    Hypertension    Osteopenia     Past Surgical History:  Procedure Laterality Date   CATARACT EXTRACTION Bilateral    COLONOSCOPY     None      Social History   Socioeconomic History   Marital status: Married    Spouse name: Not on file   Number of children: 0   Years of education: Not on file   Highest education level: 12th grade  Occupational History   Occupation: Retired    Associate Professor: [employer]  Tobacco Use   Smoking status: Never   Smokeless tobacco: Never   Tobacco comments:    Never smoker   Vaping Use   Vaping status: Never Used  Substance and Sexual Activity   Alcohol use: No   Drug use: No   Sexual activity: Not on file  Other Topics Concern   Not on file  Social History Narrative   Lives alone.  Wife is now in nursing home.   His niece and nephew check on him frequently; he has a close friend that helps him around the house   Niece drives him out of town and helps with shopping   Social Drivers of Health   Tobacco Use: Low Risk (06/10/2024)   Patient History    Smoking Tobacco Use: Never    Smokeless Tobacco Use: Never    Passive Exposure: Not on file  Financial Resource Strain: Low Risk (08/17/2023)   Overall  Financial Resource Strain (CARDIA)    Difficulty of Paying Living Expenses: Not hard at all  Food Insecurity: No Food Insecurity (08/17/2023)   Hunger Vital Sign    Worried About Running Out of Food in the Last Year: Never true    Ran Out of Food in the Last Year: Never true  Transportation Needs: No Transportation Needs (08/17/2023)   PRAPARE - Administrator, Civil Service (Medical): No    Lack of Transportation (Non-Medical): No  Physical Activity: Unknown (03/28/2023)   Exercise Vital Sign    Days of Exercise per Week: 0 days    Minutes of Exercise per Session: Not on file  Recent Concern: Physical Activity - Inactive (03/28/2023)   Exercise Vital Sign    Days of Exercise per Week: 0 days    Minutes of Exercise per Session: 30 min  Stress: Stress Concern Present (08/17/2023)   [employer] of Occupational Health - Occupational Stress Questionnaire    Feeling of Stress : To some extent  Social Connections: Moderately Isolated (08/17/2023)   Social Connection and Isolation Panel    Frequency of Communication with Friends and Family: More than three times a week    Frequency of Social Gatherings  with Friends and Family: Three times a week    Attends Religious Services: Never    Active Member of Clubs or Organizations: No    Attends Banker Meetings: Never    Marital Status: Married  Catering Manager Violence: Not At Risk (08/17/2023)   Humiliation, Afraid, Rape, and Kick questionnaire    Fear of Current or Ex-Partner: No    Emotionally Abused: No    Physically Abused: No    Sexually Abused: No  Depression (PHQ2-9): Low Risk (03/31/2024)   Depression (PHQ2-9)    PHQ-2 Score: 0  Alcohol Screen: Low Risk (08/17/2023)   Alcohol Screen    Last Alcohol Screening Score (AUDIT): 0  Housing: Unknown (08/17/2023)   Housing Stability Vital Sign    Unable to Pay for Housing in the Last Year: No    Number of Times Moved in the Last Year: Not on file    Homeless  in the Last Year: No  Utilities: Not At Risk (08/17/2023)   AHC Utilities    Threatened with loss of utilities: No  Health Literacy: Adequate Health Literacy (08/17/2023)   B1300 Health Literacy    Frequency of need for help with medical instructions: Never   Family History  Problem Relation Age of Onset   COPD Mother    Cancer Father        Bone   Cancer Brother        Bone   Stroke Maternal Grandfather    Arthritis Brother    Hypertension Brother       VITAL SIGNS BP (!) 112/58   Pulse 87   Temp (!) 97.2 F (36.2 C)   Resp 18   Ht 5' 10 (1.778 m)   Wt 148 lb 12.8 oz (67.5 kg)   SpO2 99%   BMI 21.35 kg/m   Outpatient Encounter Medications as of 06/10/2024  Medication Sig   apixaban  (ELIQUIS ) 2.5 MG TABS tablet Take 1 tablet (2.5 mg total) by mouth 2 (two) times daily.   Cholecalciferol (VITAMIN D3) 5000 units CAPS Take 5,000 Units by mouth daily.   cilostazol  (PLETAL ) 100 MG tablet Take 1 tablet (100 mg total) by mouth 2 (two) times daily.   latanoprost (XALATAN) 0.005 % ophthalmic solution Place 1 drop into both eyes at bedtime.   Menthol, Topical Analgesic, (ICY HOT EX) Apply 1 Application topically 4 (four) times daily as needed (for joint pain).   metoprolol  succinate (TOPROL -XL) 100 MG 24 hr tablet Take 1 tablet (100 mg total) by mouth daily. with food   Omega 3 1000 MG CAPS Take 2,000 mg by mouth daily.   omeprazole  (PRILOSEC) 20 MG capsule Take 1 capsule (20 mg total) by mouth daily.   simvastatin  (ZOCOR ) 20 MG tablet Take 1 tablet (20 mg total) by mouth daily.   [DISCONTINUED] Ferrous Sulfate (IRON PO) Take by mouth. (Patient not taking: Reported on 05/07/2024)   [DISCONTINUED] megestrol  (MEGACE ) 40 MG tablet Take 1 tablet (40 mg total) by mouth daily.   [DISCONTINUED] Simethicone (GAS-X PO) Take by mouth.   No facility-administered encounter medications on file as of 06/10/2024.     SIGNIFICANT DIAGNOSTIC EXAMS  LABS:   03-31-24: chol 111; ldl 39 trig 80;  hdl 56 05-06-24: wbc 4.8; hgb 10.7; hct 32.5; mcv 96; plt 211; glucose 113; bun 60; creat 2.17; k+ 3.5; na++ 141; ca 9.4 gfr 28   Review of Systems  Constitutional:  Negative for malaise/fatigue.  Respiratory:  Negative for cough and shortness of  breath.   Cardiovascular:  Negative for chest pain, palpitations and leg swelling.  Gastrointestinal:  Negative for abdominal pain, constipation and heartburn.  Musculoskeletal:  Negative for back pain, joint pain and myalgias.  Skin: Negative.   Neurological:  Negative for dizziness.  Psychiatric/Behavioral:  The patient is not nervous/anxious.      Physical Exam Constitutional:      General: He is not in acute distress.    Appearance: He is well-developed. He is not diaphoretic.  Neck:     Thyroid: No thyromegaly.  Cardiovascular:     Rate and Rhythm: Normal rate and regular rhythm.     Heart sounds: Normal heart sounds.  Pulmonary:     Effort: Pulmonary effort is normal. No respiratory distress.     Breath sounds: Normal breath sounds.  Abdominal:     General: Bowel sounds are normal. There is no distension.     Palpations: Abdomen is soft.     Tenderness: There is no abdominal tenderness.  Musculoskeletal:        General: Normal range of motion.     Cervical back: Neck supple.     Right lower leg: Edema present.     Left lower leg: Edema present.  Lymphadenopathy:     Cervical: No cervical adenopathy.  Skin:    General: Skin is warm and dry.  Neurological:     Mental Status: He is alert. Mental status is at baseline.  Psychiatric:        Mood and Affect: Mood normal.       ASSESSMENT/ PLAN:  TODAY  Chronic atrial fibrillation: heart rate is stable; will continue toprol  xl 100 mg daily for rate control and is on eliquis  2.5 mg twice daily   2. Primary hypertension: b/p 112/58: will continue toprol  xl 100 mg daily   3. PVD (Peripheral vascular disease) is without complaints of pain present will continue cilostazol   100 mg twice daily   4. Gastroesophageal reflux disease without esophagitis: will continue prilosec 20 mg daily   5. Stage 3b chronic kidney disease: bun 60; creat 2.17; gfr 28  6. Mixed hyperlipidemia: ldl 39 will continue zocor  20 mg daily   7. Increased intraocular pressure bilateral: will continue xalatan to both eyes nightly   8. Chronic pain syndrome: will continue tylenol cr 650 mg twice daily and twice daily as needed; has icy hot as needed.   9. Anemia due to chronic kidney disease stage 3b: hgb 10.7 will monitor may need to restart iron.   Will check cbc; cmp; iron studies.    Barnie Seip NP [facility]  call [phone]     [1] No Known Allergies  "

## 2024-06-11 ENCOUNTER — Encounter: Payer: Self-pay | Admitting: Internal Medicine

## 2024-06-11 ENCOUNTER — Non-Acute Institutional Stay (SKILLED_NURSING_FACILITY): Payer: Self-pay | Admitting: Internal Medicine

## 2024-06-11 DIAGNOSIS — D649 Anemia, unspecified: Secondary | ICD-10-CM

## 2024-06-11 DIAGNOSIS — R634 Abnormal weight loss: Secondary | ICD-10-CM

## 2024-06-11 DIAGNOSIS — I4821 Permanent atrial fibrillation: Secondary | ICD-10-CM | POA: Diagnosis not present

## 2024-06-11 DIAGNOSIS — N1832 Chronic kidney disease, stage 3b: Secondary | ICD-10-CM | POA: Diagnosis not present

## 2024-06-11 NOTE — Patient Instructions (Signed)
 See assessment and plan under each diagnosis in the problem list and acutely for this visit

## 2024-06-11 NOTE — Progress Notes (Signed)
 "   NURSING HOME LOCATION:  [facility] ROOM NUMBER:  [id number]  CODE STATUS: DNR  PCP:  Barnie Seip NP  This is a comprehensive admission note to this SNFperformed on this date less than 30 days from date of admission. Included are preadmission medical/surgical history; reconciled medication list; family history; social history and comprehensive review of systems.  Corrections and additions to the records were documented. Comprehensive physical exam was also performed. Additionally a clinical summary was entered for each active diagnosis pertinent to this admission in the Problem List to enhance continuity of care.  HPI:He transferred to this facility on 06/09/2024.  Past medical and surgical history: Includes atrial fibrillation; GERD; PVD; CKD stage IIIb; mixed hyperlipidemia; history of colonic polyposis; essential hypertension; osteopenia; and increased intraocular pressure. Surgeries and procedures include colonoscopy and cataract extraction.  Most recent labs were completed 05/06/2024 and revealed a creatinine 2.17 and GFR 28 compatible with high stage IV CKD.  BUN was 60 suggesting a prerenal component.  On 4/29 creatinine had been 1.42 and GFR 47 compatible with CKD stage IIIa.  Nephrologist, Dr. Macel is evaluating his renal insufficiency.  Renal ultrasound was performed 04/29/2024 revealing diffuse cortical thinning about the kidneys bilaterally consistent with atrophy.  2 simple left renal cysts measuring up to 5.7 cm appeared benign.  No follow-up imaging recommended.  He was supposed to have seen him in follow-up 01/05 but he was being transferred to this facility. Normochromic, normocytic anemia was present with H/H of 10.7/32.5.  There has been progression of anemia since 4/29 when values were 12.4/37.8.  This is in the context of Eliquis  prophylaxis.  Mild hyperglycemia has been noted with glucoses ranging from 103 up to 113.  There is no current A1c or TSH on  record.  Family history: reviewed, non contributory due to advanced age.  Social history:non drinker; non smoker.  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   Review of systems: He was living alone.  He has had a poor appetite for a year which resulted in dramatic weight loss from 216 down to his current weight.  His appetite is now improving.  His major problem is pain in the knees and ankles.  The pain is worse upon arising each morning.  He has been taking Tylenol.  He denies using NSAIDs. He has had frequent diarrhea in the past described as watery bowel movements.    Constitutional: No fever  Eyes: No redness, discharge, pain, vision change ENT/mouth: No nasal congestion, purulent discharge, earache, change in hearing, sore throat  Cardiovascular: No chest pain, palpitations, paroxysmal nocturnal dyspnea, claudication, edema  Respiratory: No cough, sputum production, hemoptysis, DOE, significant snoring, apnea  Gastrointestinal: No heartburn, dysphagia, abdominal pain, nausea /vomiting, rectal bleeding, melena Genitourinary: No dysuria, hematuria, pyuria, incontinence, nocturia Dermatologic: No rash, pruritus, change in appearance of skin Neurologic: No dizziness, headache, syncope, seizures, numbness, tingling Psychiatric: No significant anxiety, depression, insomnia Endocrine: No change in hair/skin/nails, excessive thirst, excessive hunger, excessive urination  Hematologic/lymphatic: No significant bruising, lymphadenopathy, abnormal bleeding Allergy/immunology: No itchy/watery eyes, significant sneezing, urticaria, angioedema  Physical exam:  Pertinent or positive findings: He appears his age.  He has alopecia over the crown and he combs the hair over the crown.  Facies tend to be masked.  He has a research officer, trade union.  Complete dentures are present.  There is a fine tremor of the mandible.  Heart sounds are distant and rhythm is irregular.  Pedal  pulses are decreased.  He has 1/2+ edema at the sock line.  He has marked DIP arthritic changes.  He has interosseous wasting of the hands.  He also has a coarse tremor of the right hand.  General appearance:  no acute distress, increased work of breathing is present.   Lymphatic: No lymphadenopathy about the head, neck, axilla. Eyes: No conjunctival inflammation or lid edema is present. There is no scleral icterus. Ears:  External ear exam shows no significant lesions or deformities.   Nose:  External nasal examination shows no deformity or inflammation. Nasal mucosa are pink and moist without lesions, exudates Neck:  No thyromegaly, masses, tenderness noted.    Heart:  No gallop, murmur, click, rub.  Lungs: Chest clear to auscultation without wheezes, rhonchi, rales, rubs. Abdomen: Bowel sounds are normal.  Abdomen is soft and nontender with no organomegaly, hernias, masses. GU: Deferred  Extremities:  No cyanosis, clubbing. Neurologic exam:  Balance, Rhomberg, finger to nose testing could not be completed due to clinical state Skin: Warm & dry w/o tenting. No significant lesions or rash.  See clinical summary under each active problem in the Problem List with associated updated therapeutic plan :  Atrial fibrillation (HCC) Rate is adequately controlled.  Continue Eliquis  prophylaxis.  Monitor for any bleeding dyscrasias.  Update TSH.  Stage 3b chronic kidney disease (HCC) There has been progression of his CKD since 4/29 when the creatinine was 1.42 and GFR 47 indicating CKD low stage IIIa.  On 05/06/2024 creatinine was 2.17, GFR 28, and BUN 60.  This would be compatible with high stage IV CKD.  Med list reviewed; no nephrotoxic agents identified. Nephrology follow-up will be rescheduled.  Weight loss, unintentional He states that anorexia began approximately a year ago.  Over the last year he lost from 216 down to his current weight.  He states appetite is improving and he is beginning  to regain weight.  TSH should be updated. Although albumin and total protein were normal; he has interosseous wasting and protein caloric malnutrition is suspect.  Nutritionist will assess at SNF.  Normochromic normocytic anemia There has been progression of his normochromic, normocytic anemia since 10/02/2023 when H/H was 12.4/37.8.  On 12/2 values were 10.7/32.5.  Indices were normochromic, normocytic.  No bleeding dyscrasias reported despite Eliquis  prophylaxis.  "

## 2024-06-11 NOTE — Assessment & Plan Note (Addendum)
 There has been progression of his normochromic, normocytic anemia since 10/02/2023 when H/H was 12.4/37.8.  On 12/2 values were 10.7/32.5.  Indices were normochromic, normocytic.  No bleeding dyscrasias reported despite Eliquis  prophylaxis.

## 2024-06-11 NOTE — Assessment & Plan Note (Addendum)
 There has been progression of his CKD since 4/29 when the creatinine was 1.42 and GFR 47 indicating CKD low stage IIIa.  On 05/06/2024 creatinine was 2.17, GFR 28, and BUN 60.  This would be compatible with high stage IV CKD.  Med list reviewed; no nephrotoxic agents identified. Nephrology follow-up will be rescheduled.

## 2024-06-11 NOTE — Assessment & Plan Note (Signed)
 He states that anorexia began approximately a year ago.  Over the last year he lost from 216 down to his current weight.  He states appetite is improving and he is beginning to regain weight.  TSH should be updated. Although albumin and total protein were normal; he has interosseous wasting and protein caloric malnutrition is suspect.  Nutritionist will assess at SNF.

## 2024-06-11 NOTE — Assessment & Plan Note (Deleted)
 There has been progression of his normochromic, normocytic anemia since 10/02/2023 when H/H was 12.4/37.8.  On 12/2 values were 10.7/32.5.  Indices were normochromic, normocytic.  No bleeding dyscrasias reported.

## 2024-06-11 NOTE — Assessment & Plan Note (Addendum)
 Rate is adequately controlled.  Continue Eliquis  prophylaxis.  Monitor for any bleeding dyscrasias.  Update TSH.

## 2024-06-12 ENCOUNTER — Other Ambulatory Visit (HOSPITAL_COMMUNITY)
Admission: RE | Admit: 2024-06-12 | Discharge: 2024-06-12 | Disposition: A | Discharge status: Home / Self Care | Source: Skilled Nursing Facility | Attending: Adult Health | Admitting: Adult Health

## 2024-06-12 DIAGNOSIS — N1832 Chronic kidney disease, stage 3b: Secondary | ICD-10-CM | POA: Insufficient documentation

## 2024-06-12 LAB — FERRITIN: Ferritin: 409 ng/mL — ABNORMAL HIGH (ref 24–336)

## 2024-06-12 LAB — COMPREHENSIVE METABOLIC PANEL WITH GFR
ALT: 11 U/L (ref 0–44)
AST: 23 U/L (ref 15–41)
Albumin: 3.7 g/dL (ref 3.5–5.0)
Alkaline Phosphatase: 30 U/L — ABNORMAL LOW (ref 38–126)
Anion gap: 12 (ref 5–15)
BUN: 28 mg/dL — ABNORMAL HIGH (ref 8–23)
CO2: 26 mmol/L (ref 22–32)
Calcium: 8.9 mg/dL (ref 8.9–10.3)
Chloride: 107 mmol/L (ref 98–111)
Creatinine, Ser: 1.2 mg/dL (ref 0.61–1.24)
GFR, Estimated: 57 mL/min — ABNORMAL LOW
Glucose, Bld: 94 mg/dL (ref 70–99)
Potassium: 3.3 mmol/L — ABNORMAL LOW (ref 3.5–5.1)
Sodium: 145 mmol/L (ref 135–145)
Total Bilirubin: 0.4 mg/dL (ref 0.0–1.2)
Total Protein: 5.8 g/dL — ABNORMAL LOW (ref 6.5–8.1)

## 2024-06-12 LAB — CBC
HCT: 29.1 % — ABNORMAL LOW (ref 39.0–52.0)
Hemoglobin: 9.6 g/dL — ABNORMAL LOW (ref 13.0–17.0)
MCH: 31.7 pg (ref 26.0–34.0)
MCHC: 33 g/dL (ref 30.0–36.0)
MCV: 96 fL (ref 80.0–100.0)
Platelets: 246 K/uL (ref 150–400)
RBC: 3.03 MIL/uL — ABNORMAL LOW (ref 4.22–5.81)
RDW: 15.3 % (ref 11.5–15.5)
WBC: 6.1 K/uL (ref 4.0–10.5)
nRBC: 0 % (ref 0.0–0.2)

## 2024-06-17 ENCOUNTER — Encounter: Payer: Self-pay | Admitting: Adult Health

## 2024-06-17 ENCOUNTER — Non-Acute Institutional Stay (SKILLED_NURSING_FACILITY): Admitting: Adult Health

## 2024-06-17 DIAGNOSIS — G6289 Other specified polyneuropathies: Secondary | ICD-10-CM | POA: Diagnosis not present

## 2024-06-17 DIAGNOSIS — G894 Chronic pain syndrome: Secondary | ICD-10-CM | POA: Diagnosis not present

## 2024-06-17 DIAGNOSIS — G629 Polyneuropathy, unspecified: Secondary | ICD-10-CM | POA: Insufficient documentation

## 2024-06-17 NOTE — Progress Notes (Addendum)
 " Location:  [facility] Room Number: 103 Place of Service:  SNF (31)   CODE STATUS: dnr   Allergies[1]  Chief Complaint  Patient presents with   Acute Visit    Bilateral lower extremity pain     HPI:  He has had bilateral lower leg pain for the past 2-3 years. He states that the pain is achy in nature and just hurts. He does have numbness present in bilateral feet. He does have bilateral lower extremity edema as well.   Past Medical History:  Diagnosis Date   Atrial fibrillation (HCC)    Colon polyps    GERD (gastroesophageal reflux disease)    Hyperlipidemia    Hypertension    Osteopenia     Past Surgical History:  Procedure Laterality Date   CATARACT EXTRACTION Bilateral    COLONOSCOPY     None      Social History   Socioeconomic History   Marital status: Married    Spouse name: Not on file   Number of children: 0   Years of education: Not on file   Highest education level: 12th grade  Occupational History   Occupation: Retired    Associate Professor: [employer]  Tobacco Use   Smoking status: Never   Smokeless tobacco: Never   Tobacco comments:    Never smoker   Vaping Use   Vaping status: Never Used  Substance and Sexual Activity   Alcohol use: No   Drug use: No   Sexual activity: Not on file  Other Topics Concern   Not on file  Social History Narrative   Lives alone.  Wife is now in nursing home.   His niece and nephew check on him frequently; he has a close friend that helps him around the house   Niece drives him out of town and helps with shopping   Social Drivers of Health   Tobacco Use: Low Risk (06/17/2024)   Patient History    Smoking Tobacco Use: Never    Smokeless Tobacco Use: Never    Passive Exposure: Not on file  Financial Resource Strain: Low Risk (08/17/2023)   Overall Financial Resource Strain (CARDIA)    Difficulty of Paying Living Expenses: Not hard at all  Food Insecurity: No Food Insecurity (08/17/2023)   Hunger  Vital Sign    Worried About Running Out of Food in the Last Year: Never true    Ran Out of Food in the Last Year: Never true  Transportation Needs: No Transportation Needs (08/17/2023)   PRAPARE - Administrator, Civil Service (Medical): No    Lack of Transportation (Non-Medical): No  Physical Activity: Unknown (03/28/2023)   Exercise Vital Sign    Days of Exercise per Week: 0 days    Minutes of Exercise per Session: Not on file  Recent Concern: Physical Activity - Inactive (03/28/2023)   Exercise Vital Sign    Days of Exercise per Week: 0 days    Minutes of Exercise per Session: 30 min  Stress: Stress Concern Present (08/17/2023)   [employer] of Occupational Health - Occupational Stress Questionnaire    Feeling of Stress : To some extent  Social Connections: Moderately Isolated (08/17/2023)   Social Connection and Isolation Panel    Frequency of Communication with Friends and Family: More than three times a week    Frequency of Social Gatherings with Friends and Family: Three times a week    Attends Religious Services: Never    Active  Member of Clubs or Organizations: No    Attends Banker Meetings: Never    Marital Status: Married  Catering Manager Violence: Not At Risk (08/17/2023)   Humiliation, Afraid, Rape, and Kick questionnaire    Fear of Current or Ex-Partner: No    Emotionally Abused: No    Physically Abused: No    Sexually Abused: No  Depression (PHQ2-9): Low Risk (03/31/2024)   Depression (PHQ2-9)    PHQ-2 Score: 0  Alcohol Screen: Low Risk (08/17/2023)   Alcohol Screen    Last Alcohol Screening Score (AUDIT): 0  Housing: Unknown (08/17/2023)   Housing Stability Vital Sign    Unable to Pay for Housing in the Last Year: No    Number of Times Moved in the Last Year: Not on file    Homeless in the Last Year: No  Utilities: Not At Risk (08/17/2023)   AHC Utilities    Threatened with loss of utilities: No  Health Literacy: Adequate Health  Literacy (08/17/2023)   B1300 Health Literacy    Frequency of need for help with medical instructions: Never   Family History  Problem Relation Age of Onset   COPD Mother    Cancer Father        Bone   Cancer Brother        Bone   Stroke Maternal Grandfather    Arthritis Brother    Hypertension Brother       VITAL SIGNS BP 105/70   Pulse 72   Temp (!) 97.5 F (36.4 C)   Resp 20   Ht 5' 10 (1.778 m)   Wt 155 lb 9.6 oz (70.6 kg)   SpO2 96%   BMI 22.33 kg/m   Outpatient Encounter Medications as of 06/17/2024  Medication Sig   acetaminophen (TYLENOL) 650 MG CR tablet Take 650 mg by mouth 2 (two) times daily. And may take twice daily as needed   apixaban  (ELIQUIS ) 2.5 MG TABS tablet Take 1 tablet (2.5 mg total) by mouth 2 (two) times daily.   Cholecalciferol (VITAMIN D3) 5000 units CAPS Take 5,000 Units by mouth daily.   cilostazol  (PLETAL ) 100 MG tablet Take 1 tablet (100 mg total) by mouth 2 (two) times daily.   latanoprost (XALATAN) 0.005 % ophthalmic solution Place 1 drop into both eyes at bedtime.   Menthol, Topical Analgesic, (ICY HOT EX) Apply 1 Application topically 4 (four) times daily as needed (for joint pain).   metoprolol  succinate (TOPROL -XL) 100 MG 24 hr tablet Take 1 tablet (100 mg total) by mouth daily. with food   Omega 3 1000 MG CAPS Take 2,000 mg by mouth daily.   omeprazole  (PRILOSEC) 20 MG capsule Take 1 capsule (20 mg total) by mouth daily.   simvastatin  (ZOCOR ) 20 MG tablet Take 1 tablet (20 mg total) by mouth daily.   No facility-administered encounter medications on file as of 06/17/2024.     SIGNIFICANT DIAGNOSTIC EXAMS  LABS:   03-31-24: chol 111; ldl 39 trig 80; hdl 56 05-06-24: wbc 4.8; hgb 10.7; hct 32.5; mcv 96; plt 211; glucose 113; bun 60; creat 2.17; k+ 3.5; na++ 141; ca 9.4 gfr 28   Review of Systems  Constitutional:  Negative for malaise/fatigue.  Respiratory:  Negative for cough and shortness of breath.   Cardiovascular:  Positive  for leg swelling. Negative for chest pain and palpitations.  Gastrointestinal:  Negative for abdominal pain, constipation and heartburn.  Musculoskeletal:  Positive for myalgias. Negative for back pain and joint pain.  Bilateral lower leg pain and numbness present   Skin: Negative.   Neurological:  Negative for dizziness.  Psychiatric/Behavioral:  The patient is not nervous/anxious.      Physical Exam Constitutional:      General: He is not in acute distress.    Appearance: He is well-developed. He is not diaphoretic.  Neck:     Thyroid: No thyromegaly.  Cardiovascular:     Rate and Rhythm: Normal rate and regular rhythm.     Heart sounds: Normal heart sounds.  Pulmonary:     Effort: Pulmonary effort is normal. No respiratory distress.     Breath sounds: Normal breath sounds.  Abdominal:     General: Bowel sounds are normal. There is no distension.     Palpations: Abdomen is soft.     Tenderness: There is no abdominal tenderness.  Musculoskeletal:        General: Normal range of motion.     Cervical back: Neck supple.     Right lower leg: Edema present.     Left lower leg: Edema present.     Comments: 1+  Lymphadenopathy:     Cervical: No cervical adenopathy.  Skin:    General: Skin is warm and dry.  Neurological:     Mental Status: He is alert and oriented to person, place, and time.  Psychiatric:        Mood and Affect: Mood normal.       ASSESSMENT/ PLAN:  TODAY  Chronic pain syndrome Other peripheral neuropathy Bilateral lower extremity edema   Will begin gabapentin 100 mg nightly and will monitor his status.  Will begin lasix 20 mg daily with k+ 20 meq daily and will repeat labs in one week.    Barnie Seip NP [facility]  call [phone]      [1] No Known Allergies  "

## 2024-06-17 NOTE — Addendum Note (Signed)
 Addended by: LANDY BARNIE RAMAN on: 06/17/2024 02:56 PM   Modules accepted: Orders

## 2024-06-18 ENCOUNTER — Ambulatory Visit: Admitting: Cardiology

## 2024-06-23 ENCOUNTER — Other Ambulatory Visit (HOSPITAL_COMMUNITY)
Admission: RE | Admit: 2024-06-23 | Discharge: 2024-06-23 | Disposition: A | Discharge status: Home / Self Care | Source: Skilled Nursing Facility | Attending: Adult Health | Admitting: Adult Health

## 2024-06-23 ENCOUNTER — Non-Acute Institutional Stay (SKILLED_NURSING_FACILITY): Payer: Self-pay | Admitting: Adult Health

## 2024-06-23 ENCOUNTER — Encounter: Payer: Self-pay | Admitting: Adult Health

## 2024-06-23 DIAGNOSIS — E876 Hypokalemia: Secondary | ICD-10-CM | POA: Diagnosis not present

## 2024-06-23 DIAGNOSIS — N1832 Chronic kidney disease, stage 3b: Secondary | ICD-10-CM | POA: Insufficient documentation

## 2024-06-23 LAB — BASIC METABOLIC PANEL WITH GFR
Anion gap: 12 (ref 5–15)
BUN: 26 mg/dL — ABNORMAL HIGH (ref 8–23)
CO2: 21 mmol/L — ABNORMAL LOW (ref 22–32)
Calcium: 8.2 mg/dL — ABNORMAL LOW (ref 8.9–10.3)
Chloride: 109 mmol/L (ref 98–111)
Creatinine, Ser: 1.07 mg/dL (ref 0.61–1.24)
GFR, Estimated: >60 mL/min
Glucose, Bld: 85 mg/dL (ref 70–99)
Potassium: 3.3 mmol/L — ABNORMAL LOW (ref 3.5–5.1)
Sodium: 143 mmol/L (ref 135–145)

## 2024-06-23 NOTE — Progress Notes (Unsigned)
 " Location:  [facility] Room Number: 103 Place of Service:  SNF (31)   CODE STATUS: dnr   Allergies[1]  Chief Complaint  Patient presents with   Acute Visit    Follow up labs     HPI:  He is taking k+ 20 meq daily for his hypokalemia. His level today is 3.3. He is taking lasix daily for his lower extremity edema. There are no reports of chest pain or shortness of breath present.   Past Medical History:  Diagnosis Date   Atrial fibrillation (HCC)    Colon polyps    GERD (gastroesophageal reflux disease)    Hyperlipidemia    Hypertension    Osteopenia     Past Surgical History:  Procedure Laterality Date   CATARACT EXTRACTION Bilateral    COLONOSCOPY     None      Social History   Socioeconomic History   Marital status: Married    Spouse name: Not on file   Number of children: 0   Years of education: Not on file   Highest education level: 12th grade  Occupational History   Occupation: Retired    Associate Professor: [employer]  Tobacco Use   Smoking status: Never   Smokeless tobacco: Never   Tobacco comments:    Never smoker   Vaping Use   Vaping status: Never Used  Substance and Sexual Activity   Alcohol use: No   Drug use: No   Sexual activity: Not on file  Other Topics Concern   Not on file  Social History Narrative   Lives alone.  Wife is now in nursing home.   His niece and nephew check on him frequently; he has a close friend that helps him around the house   Niece drives him out of town and helps with shopping   Social Drivers of Health   Tobacco Use: Low Risk (06/17/2024)   Patient History    Smoking Tobacco Use: Never    Smokeless Tobacco Use: Never    Passive Exposure: Not on file  Financial Resource Strain: Low Risk (08/17/2023)   Overall Financial Resource Strain (CARDIA)    Difficulty of Paying Living Expenses: Not hard at all  Food Insecurity: No Food Insecurity (08/17/2023)   Hunger Vital Sign    Worried About Running Out of  Food in the Last Year: Never true    Ran Out of Food in the Last Year: Never true  Transportation Needs: No Transportation Needs (08/17/2023)   PRAPARE - Administrator, Civil Service (Medical): No    Lack of Transportation (Non-Medical): No  Physical Activity: Unknown (03/28/2023)   Exercise Vital Sign    Days of Exercise per Week: 0 days    Minutes of Exercise per Session: Not on file  Recent Concern: Physical Activity - Inactive (03/28/2023)   Exercise Vital Sign    Days of Exercise per Week: 0 days    Minutes of Exercise per Session: 30 min  Stress: Stress Concern Present (08/17/2023)   [employer] of Occupational Health - Occupational Stress Questionnaire    Feeling of Stress : To some extent  Social Connections: Moderately Isolated (08/17/2023)   Social Connection and Isolation Panel    Frequency of Communication with Friends and Family: More than three times a week    Frequency of Social Gatherings with Friends and Family: Three times a week    Attends Religious Services: Never    Active Member of Clubs or Organizations:  No    Attends Club or Organization Meetings: Never    Marital Status: Married  Catering Manager Violence: Not At Risk (08/17/2023)   Humiliation, Afraid, Rape, and Kick questionnaire    Fear of Current or Ex-Partner: No    Emotionally Abused: No    Physically Abused: No    Sexually Abused: No  Depression (PHQ2-9): Low Risk (03/31/2024)   Depression (PHQ2-9)    PHQ-2 Score: 0  Alcohol Screen: Low Risk (08/17/2023)   Alcohol Screen    Last Alcohol Screening Score (AUDIT): 0  Housing: Unknown (08/17/2023)   Housing Stability Vital Sign    Unable to Pay for Housing in the Last Year: No    Number of Times Moved in the Last Year: Not on file    Homeless in the Last Year: No  Utilities: Not At Risk (08/17/2023)   AHC Utilities    Threatened with loss of utilities: No  Health Literacy: Adequate Health Literacy (08/17/2023)   B1300 Health Literacy     Frequency of need for help with medical instructions: Never   Family History  Problem Relation Age of Onset   COPD Mother    Cancer Father        Bone   Cancer Brother        Bone   Stroke Maternal Grandfather    Arthritis Brother    Hypertension Brother       VITAL SIGNS BP 121/67   Pulse 68   Temp 98.1 F (36.7 C)   Resp 20   Ht 5' 10 (1.778 m)   Wt 154 lb 9.6 oz (70.1 kg)   SpO2 94%   BMI 22.18 kg/m   Outpatient Encounter Medications as of 06/23/2024  Medication Sig   acetaminophen (TYLENOL) 650 MG CR tablet Take 650 mg by mouth 2 (two) times daily. And may take twice daily as needed   apixaban  (ELIQUIS ) 2.5 MG TABS tablet Take 1 tablet (2.5 mg total) by mouth 2 (two) times daily.   Cholecalciferol (VITAMIN D3) 5000 units CAPS Take 5,000 Units by mouth daily.   cilostazol  (PLETAL ) 100 MG tablet Take 1 tablet (100 mg total) by mouth 2 (two) times daily.   furosemide (LASIX) 20 MG tablet Take 20 mg by mouth daily.   gabapentin (NEURONTIN) 100 MG capsule Take 100 mg by mouth at bedtime.   latanoprost (XALATAN) 0.005 % ophthalmic solution Place 1 drop into both eyes at bedtime.   Menthol, Topical Analgesic, (ICY HOT EX) Apply 1 Application topically 4 (four) times daily as needed (for joint pain).   metoprolol  succinate (TOPROL -XL) 100 MG 24 hr tablet Take 1 tablet (100 mg total) by mouth daily. with food   Omega 3 1000 MG CAPS Take 2,000 mg by mouth daily.   omeprazole  (PRILOSEC) 20 MG capsule Take 1 capsule (20 mg total) by mouth daily.   potassium chloride SA (KLOR-CON M) 20 MEQ tablet Take 20 mEq by mouth daily.   simvastatin  (ZOCOR ) 20 MG tablet Take 1 tablet (20 mg total) by mouth daily.   No facility-administered encounter medications on file as of 06/23/2024.     SIGNIFICANT DIAGNOSTIC EXAMS  LABS:   03-31-24: chol 111; ldl 39 trig 80; hdl 56 05-06-24: wbc 4.8; hgb 10.7; hct 32.5; mcv 96; plt 211; glucose 113; bun 60; creat 2.17; k+ 3.5; na++ 141; ca 9.4  gfr 28   TODAY:   06-23-24: glucose 85; bun 26; creat 1.07; k+ 3.3 na++ 142; ca 8.2 gfr >60  Review of Systems  Constitutional:  Negative for malaise/fatigue.  Respiratory:  Negative for cough and shortness of breath.   Cardiovascular:  Negative for chest pain, palpitations and leg swelling.  Gastrointestinal:  Negative for abdominal pain, constipation and heartburn.  Musculoskeletal:  Negative for back pain, joint pain and myalgias.  Skin: Negative.   Neurological:  Negative for dizziness.  Psychiatric/Behavioral:  The patient is not nervous/anxious.     Physical Exam Constitutional:      General: He is not in acute distress.    Appearance: He is well-developed. He is not diaphoretic.  Neck:     Thyroid: No thyromegaly.  Cardiovascular:     Rate and Rhythm: Normal rate and regular rhythm.     Heart sounds: Normal heart sounds.  Pulmonary:     Effort: Pulmonary effort is normal. No respiratory distress.     Breath sounds: Normal breath sounds.  Abdominal:     General: Bowel sounds are normal. There is no distension.     Palpations: Abdomen is soft.     Tenderness: There is no abdominal tenderness.  Musculoskeletal:        General: Normal range of motion.     Cervical back: Neck supple.     Right lower leg: Edema present.     Left lower leg: Edema present.  Lymphadenopathy:     Cervical: No cervical adenopathy.  Skin:    General: Skin is warm and dry.  Neurological:     Mental Status: He is alert. Mental status is at baseline.  Psychiatric:        Mood and Affect: Mood normal.      ASSESSMENT/ PLAN:  TODAY  Hypokalemia: is worse: will increase k+ to 40 meq daily and will repat labs on 06-30-24.    Barnie Seip NP [facility]   call [phone]      [1] No Known Allergies  "

## 2024-06-24 ENCOUNTER — Ambulatory Visit (HOSPITAL_COMMUNITY)
Admission: RE | Admit: 2024-06-24 | Discharge: 2024-06-24 | Disposition: A | Discharge status: Home / Self Care | Source: Ambulatory Visit | Attending: Adult Health | Admitting: Adult Health

## 2024-06-24 ENCOUNTER — Other Ambulatory Visit (HOSPITAL_COMMUNITY): Payer: Self-pay | Admitting: Adult Health

## 2024-06-24 DIAGNOSIS — M7989 Other specified soft tissue disorders: Secondary | ICD-10-CM

## 2024-06-25 ENCOUNTER — Non-Acute Institutional Stay (SKILLED_NURSING_FACILITY): Payer: Self-pay | Admitting: Adult Health

## 2024-06-25 ENCOUNTER — Encounter: Payer: Self-pay | Admitting: Adult Health

## 2024-06-25 DIAGNOSIS — L03115 Cellulitis of right lower limb: Secondary | ICD-10-CM

## 2024-06-25 NOTE — Progress Notes (Unsigned)
 " Location:  [facility] Room Number: 103P Place of Service:  SNF (31)   CODE STATUS: DNR  Allergies[1]  Chief Complaint  Patient presents with   Acute Visit    Lower extremity edema     HPI:    Past Medical History:  Diagnosis Date   Atrial fibrillation (HCC)    Colon polyps    GERD (gastroesophageal reflux disease)    Hyperlipidemia    Hypertension    Osteopenia     Past Surgical History:  Procedure Laterality Date   CATARACT EXTRACTION Bilateral    COLONOSCOPY     None      Social History   Socioeconomic History   Marital status: Married    Spouse name: Not on file   Number of children: 0   Years of education: Not on file   Highest education level: 12th grade  Occupational History   Occupation: Retired    Associate Professor: [employer]  Tobacco Use   Smoking status: Never   Smokeless tobacco: Never   Tobacco comments:    Never smoker   Vaping Use   Vaping status: Never Used  Substance and Sexual Activity   Alcohol use: No   Drug use: No   Sexual activity: Not on file  Other Topics Concern   Not on file  Social History Narrative   Lives alone.  Wife is now in nursing home.   His niece and nephew check on him frequently; he has a close friend that helps him around the house   Niece drives him out of town and helps with shopping   Social Drivers of Health   Tobacco Use: Low Risk (06/25/2024)   Patient History    Smoking Tobacco Use: Never    Smokeless Tobacco Use: Never    Passive Exposure: Not on file  Financial Resource Strain: Low Risk (08/17/2023)   Overall Financial Resource Strain (CARDIA)    Difficulty of Paying Living Expenses: Not hard at all  Food Insecurity: No Food Insecurity (08/17/2023)   Hunger Vital Sign    Worried About Running Out of Food in the Last Year: Never true    Ran Out of Food in the Last Year: Never true  Transportation Needs: No Transportation Needs (08/17/2023)   PRAPARE -  Administrator, Civil Service (Medical): No    Lack of Transportation (Non-Medical): No  Physical Activity: Unknown (03/28/2023)   Exercise Vital Sign    Days of Exercise per Week: 0 days    Minutes of Exercise per Session: Not on file  Recent Concern: Physical Activity - Inactive (03/28/2023)   Exercise Vital Sign    Days of Exercise per Week: 0 days    Minutes of Exercise per Session: 30 min  Stress: Stress Concern Present (08/17/2023)   [employer] of Occupational Health - Occupational Stress Questionnaire    Feeling of Stress : To some extent  Social Connections: Moderately Isolated (08/17/2023)   Social Connection and Isolation Panel    Frequency of Communication with Friends and Family: More than three times a week    Frequency of Social Gatherings with Friends and Family: Three times a week    Attends Religious Services: Never    Active Member of Clubs or Organizations: No    Attends Banker Meetings: Never    Marital Status: Married  Catering Manager Violence: Not At Risk (08/17/2023)   Humiliation, Afraid, Rape, and Kick questionnaire    Fear of Current  or Ex-Partner: No    Emotionally Abused: No    Physically Abused: No    Sexually Abused: No  Depression (PHQ2-9): Low Risk (03/31/2024)   Depression (PHQ2-9)    PHQ-2 Score: 0  Alcohol Screen: Low Risk (08/17/2023)   Alcohol Screen    Last Alcohol Screening Score (AUDIT): 0  Housing: Unknown (08/17/2023)   Housing Stability Vital Sign    Unable to Pay for Housing in the Last Year: No    Number of Times Moved in the Last Year: Not on file    Homeless in the Last Year: No  Utilities: Not At Risk (08/17/2023)   AHC Utilities    Threatened with loss of utilities: No  Health Literacy: Adequate Health Literacy (08/17/2023)   B1300 Health Literacy    Frequency of need for help with medical instructions: Never   Family History  Problem Relation Age of Onset   COPD Mother     Cancer Father        Bone   Cancer Brother        Bone   Stroke Maternal Grandfather    Arthritis Brother    Hypertension Brother       VITAL SIGNS BP 121/67   Pulse 68   Temp 98.1 F (36.7 C)   Resp 20   Ht 5' 10 (1.778 m)   Wt 154 lb 9.6 oz (70.1 kg)   SpO2 94%   BMI 22.18 kg/m   Outpatient Encounter Medications as of 06/25/2024  Medication Sig   acetaminophen (TYLENOL) 650 MG CR tablet Take 650 mg by mouth 2 (two) times daily. And may take twice daily as needed   apixaban  (ELIQUIS ) 2.5 MG TABS tablet Take 1 tablet (2.5 mg total) by mouth 2 (two) times daily.   Cholecalciferol (VITAMIN D3) 5000 units CAPS Take 5,000 Units by mouth daily.   cilostazol  (PLETAL ) 100 MG tablet Take 1 tablet (100 mg total) by mouth 2 (two) times daily.   furosemide (LASIX) 20 MG tablet Take 20 mg by mouth daily.   gabapentin (NEURONTIN) 100 MG capsule Take 100 mg by mouth at bedtime.   latanoprost (XALATAN) 0.005 % ophthalmic solution Place 1 drop into both eyes at bedtime.   Menthol, Topical Analgesic, (ICY HOT EX) Apply 1 Application topically 4 (four) times daily as needed (for joint pain).   metoprolol  succinate (TOPROL -XL) 100 MG 24 hr tablet Take 1 tablet (100 mg total) by mouth daily. with food   Omega 3 1000 MG CAPS Take 2,000 mg by mouth daily.   omeprazole  (PRILOSEC) 20 MG capsule Take 1 capsule (20 mg total) by mouth daily.   potassium chloride SA (KLOR-CON M) 20 MEQ tablet Take 40 mEq by mouth daily. amt: 40 meq; oral Special Instructions: supplement Once A Day 08:00 AM   simvastatin  (ZOCOR ) 20 MG tablet Take 1 tablet (20 mg total) by mouth daily.   No facility-administered encounter medications on file as of 06/25/2024.     SIGNIFICANT DIAGNOSTIC EXAMS  LABS:   03-31-24: chol 111; ldl 39 trig 80; hdl 56 05-06-24: wbc 4.8; hgb 10.7; hct 32.5; mcv 96; plt 211; glucose 113; bun 60; creat 2.17; k+ 3.5; na++ 141; ca 9.4 gfr 28   TODAY:   06-23-24: glucose  85; bun 26; creat 1.07; k+ 3.3 na++ 142; ca 8.2 gfr >60   Review of Systems  Constitutional:  Negative for malaise/fatigue.  Respiratory:  Negative for cough and shortness of breath.   Cardiovascular:  Positive for  leg swelling. Negative for chest pain and palpitations.       Right leg edema   Gastrointestinal:  Negative for abdominal pain, constipation and heartburn.  Musculoskeletal:  Negative for back pain, joint pain and myalgias.       Right leg pain  Skin: Negative.   Neurological:  Negative for dizziness.  Psychiatric/Behavioral:  The patient is not nervous/anxious.     Physical Exam    ASSESSMENT/ PLAN:     Barnie Seip NP [facility]  Contact [phone] Monday through Friday 8am- 5pm  After hours call [phone]        [1] No Known Allergies "

## 2024-06-30 ENCOUNTER — Other Ambulatory Visit (HOSPITAL_COMMUNITY)
Admission: RE | Admit: 2024-06-30 | Discharge: 2024-06-30 | Disposition: A | Discharge status: Home / Self Care | Source: Skilled Nursing Facility | Attending: Adult Health | Admitting: Adult Health

## 2024-06-30 DIAGNOSIS — N1832 Chronic kidney disease, stage 3b: Secondary | ICD-10-CM | POA: Diagnosis present

## 2024-06-30 LAB — BASIC METABOLIC PANEL WITH GFR
Anion gap: 11 (ref 5–15)
BUN: 31 mg/dL — ABNORMAL HIGH (ref 8–23)
CO2: 24 mmol/L (ref 22–32)
Calcium: 8.5 mg/dL — ABNORMAL LOW (ref 8.9–10.3)
Chloride: 108 mmol/L (ref 98–111)
Creatinine, Ser: 1.41 mg/dL — ABNORMAL HIGH (ref 0.61–1.24)
GFR, Estimated: 47 mL/min — ABNORMAL LOW
Glucose, Bld: 87 mg/dL (ref 70–99)
Potassium: 3.5 mmol/L (ref 3.5–5.1)
Sodium: 143 mmol/L (ref 135–145)

## 2024-08-19 ENCOUNTER — Ambulatory Visit: Payer: Self-pay

## 2024-09-29 ENCOUNTER — Ambulatory Visit: Payer: Self-pay | Admitting: Nurse Practitioner

## 2024-11-19 ENCOUNTER — Ambulatory Visit: Admitting: Cardiology
